# Patient Record
Sex: Male | Born: 1970 | Race: Black or African American | Hispanic: No | Marital: Married | Smoking: Never smoker
Health system: Southern US, Community
[De-identification: ages and names within clinical notes are randomized; demographics above are authoritative.]

## PROBLEM LIST (undated history)

## (undated) DIAGNOSIS — R011 Cardiac murmur, unspecified: Secondary | ICD-10-CM

## (undated) DIAGNOSIS — M1990 Unspecified osteoarthritis, unspecified site: Secondary | ICD-10-CM

## (undated) DIAGNOSIS — E079 Disorder of thyroid, unspecified: Secondary | ICD-10-CM

## (undated) DIAGNOSIS — I10 Essential (primary) hypertension: Secondary | ICD-10-CM

## (undated) DIAGNOSIS — E119 Type 2 diabetes mellitus without complications: Secondary | ICD-10-CM

## (undated) HISTORY — DX: Disorder of thyroid, unspecified: E07.9

## (undated) HISTORY — DX: Type 2 diabetes mellitus without complications: E11.9

## (undated) HISTORY — PX: CARDIAC CATHETERIZATION: SHX172

## (undated) HISTORY — PX: OTHER SURGICAL HISTORY: SHX169

---

## 2005-01-16 ENCOUNTER — Ambulatory Visit: Payer: Self-pay | Admitting: Physician Assistant

## 2008-02-24 ENCOUNTER — Ambulatory Visit: Payer: Self-pay | Admitting: Gastroenterology

## 2008-03-18 ENCOUNTER — Ambulatory Visit: Payer: Self-pay | Admitting: Gastroenterology

## 2010-10-23 ENCOUNTER — Emergency Department: Payer: Self-pay | Admitting: Emergency Medicine

## 2011-09-27 ENCOUNTER — Ambulatory Visit: Payer: Self-pay

## 2013-10-29 ENCOUNTER — Emergency Department: Payer: Self-pay | Admitting: Emergency Medicine

## 2013-10-29 LAB — TROPONIN I: Troponin-I: <0.02 ng/mL

## 2013-10-29 LAB — CBC WITH DIFFERENTIAL/PLATELET
Basophil #: 0.1 10*3/uL (ref 0.0–0.1)
Basophil %: 1.1 %
EOS PCT: 1.8 %
Eosinophil #: 0.1 10*3/uL (ref 0.0–0.7)
HCT: 43 % (ref 40.0–52.0)
HGB: 14.3 g/dL (ref 13.0–18.0)
LYMPHS ABS: 1.7 10*3/uL (ref 1.0–3.6)
Lymphocyte %: 28.9 %
MCH: 27.2 pg (ref 26.0–34.0)
MCHC: 33.1 g/dL (ref 32.0–36.0)
MCV: 82 fL (ref 80–100)
MONO ABS: 0.6 x10 3/mm (ref 0.2–1.0)
MONOS PCT: 10.1 %
NEUTROS ABS: 3.4 10*3/uL (ref 1.4–6.5)
Neutrophil %: 58.1 %
PLATELETS: 229 10*3/uL (ref 150–440)
RBC: 5.25 10*6/uL (ref 4.40–5.90)
RDW: 13.7 % (ref 11.5–14.5)
WBC: 5.8 10*3/uL (ref 3.8–10.6)

## 2013-10-29 LAB — COMPREHENSIVE METABOLIC PANEL
ALK PHOS: 79 U/L
AST: 38 U/L — AB (ref 15–37)
Albumin: 3.8 g/dL (ref 3.4–5.0)
Anion Gap: 4 — ABNORMAL LOW (ref 7–16)
BUN: 11 mg/dL (ref 7–18)
Bilirubin,Total: 0.3 mg/dL (ref 0.2–1.0)
CALCIUM: 8.4 mg/dL — AB (ref 8.5–10.1)
CREATININE: 1.17 mg/dL (ref 0.60–1.30)
Chloride: 107 mmol/L (ref 98–107)
Co2: 29 mmol/L (ref 21–32)
EGFR (Non-African Amer.): >60
Glucose: 95 mg/dL (ref 65–99)
Osmolality: 279 (ref 275–301)
Potassium: 3.7 mmol/L (ref 3.5–5.1)
SGPT (ALT): 39 U/L (ref 12–78)
SODIUM: 140 mmol/L (ref 136–145)
TOTAL PROTEIN: 7.3 g/dL (ref 6.4–8.2)

## 2013-10-29 LAB — LIPASE, BLOOD: LIPASE: 277 U/L (ref 73–393)

## 2014-08-17 ENCOUNTER — Ambulatory Visit (INDEPENDENT_AMBULATORY_CARE_PROVIDER_SITE_OTHER): Payer: 59 | Admitting: Podiatry

## 2014-08-17 ENCOUNTER — Encounter: Payer: Self-pay | Admitting: Podiatry

## 2014-08-17 ENCOUNTER — Ambulatory Visit (INDEPENDENT_AMBULATORY_CARE_PROVIDER_SITE_OTHER): Payer: 59

## 2014-08-17 VITALS — BP 140/98 | HR 75 | Resp 16 | Ht 71.0 in | Wt 255.0 lb

## 2014-08-17 DIAGNOSIS — M779 Enthesopathy, unspecified: Secondary | ICD-10-CM

## 2014-08-17 DIAGNOSIS — M25572 Pain in left ankle and joints of left foot: Secondary | ICD-10-CM

## 2014-08-17 NOTE — Progress Notes (Addendum)
He presents today with chief complaint of pain to his left foot times the past several weeks to months. He states he gets a sharp stabbing pain that radiates up his leg from his left foot.  Objective: Vital signs are stable he is alert and oriented 3. He has limited range of motion subtalar joint left. Pulses are palpable left. 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  Plan: Injected the subtalar joint today with dexamethasone and Kenalog and local anesthetic each alleviated his symptoms immediately. I will follow-up with him in 3 weeks should this continue to alleviate his symptoms we will have a pair of orthotics scanned for him otherwise if he is still in pain and MRI will be our best alternative for possible surgical consideration

## 2014-09-07 ENCOUNTER — Ambulatory Visit: Payer: 59 | Admitting: Podiatry

## 2014-12-16 ENCOUNTER — Ambulatory Visit (INDEPENDENT_AMBULATORY_CARE_PROVIDER_SITE_OTHER): Payer: 59 | Admitting: Podiatry

## 2014-12-16 DIAGNOSIS — M7752 Other enthesopathy of left foot: Secondary | ICD-10-CM | POA: Diagnosis not present

## 2014-12-16 DIAGNOSIS — M779 Enthesopathy, unspecified: Principal | ICD-10-CM

## 2014-12-16 DIAGNOSIS — M205X2 Other deformities of toe(s) (acquired), left foot: Secondary | ICD-10-CM | POA: Diagnosis not present

## 2014-12-16 DIAGNOSIS — M778 Other enthesopathies, not elsewhere classified: Secondary | ICD-10-CM

## 2014-12-16 MED ORDER — METHYLPREDNISOLONE 4 MG PO TBPK: ORAL_TABLET | ORAL | Status: DC

## 2014-12-16 NOTE — Progress Notes (Signed)
Derin presents today for follow-up of his first metatarsophalangeal joint of his left foot. He states that about a week or 2 ago his foot again swell and become painful he states that the only thing that was different is that he purchased a new pair of shoes that had soft soles. Denies any changes in his past mental history medications or allergies.  Objective: Vital signs are stable he is alert and oriented 3. Pulses are strongly palpable left foot. Pain on range of motion of the first metatarsophalangeal joint left with limited dorsiflexion and hallux valgus deformity. Reviewed all radiographs which do demonstrate hallux valgus with early osteophytic changes.  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  Plan: Discussed etiology pathology conservative versus surgical therapies. Discussed appropriate shoe gear stretching exercises ice therapy and shoe modifications. He declined an injection today. I started him on a Medrol Dosepak and will follow up with him in 4 weeks if necessary. I did suggest different shoe gear.

## 2015-01-13 ENCOUNTER — Ambulatory Visit: Payer: 59 | Admitting: Podiatry

## 2016-01-11 ENCOUNTER — Emergency Department
Admission: EM | Admit: 2016-01-11 | Discharge: 2016-01-11 | Disposition: A | Discharge status: Home / Self Care | Payer: No Typology Code available for payment source | Attending: Emergency Medicine | Admitting: Emergency Medicine

## 2016-01-11 ENCOUNTER — Encounter: Payer: Self-pay | Admitting: Emergency Medicine

## 2016-01-11 ENCOUNTER — Emergency Department: Payer: No Typology Code available for payment source

## 2016-01-11 DIAGNOSIS — I10 Essential (primary) hypertension: Secondary | ICD-10-CM | POA: Insufficient documentation

## 2016-01-11 DIAGNOSIS — Y939 Activity, unspecified: Secondary | ICD-10-CM | POA: Diagnosis not present

## 2016-01-11 DIAGNOSIS — Z79899 Other long term (current) drug therapy: Secondary | ICD-10-CM | POA: Diagnosis not present

## 2016-01-11 DIAGNOSIS — S39012A Strain of muscle, fascia and tendon of lower back, initial encounter: Secondary | ICD-10-CM | POA: Insufficient documentation

## 2016-01-11 DIAGNOSIS — Y92410 Unspecified street and highway as the place of occurrence of the external cause: Secondary | ICD-10-CM | POA: Insufficient documentation

## 2016-01-11 DIAGNOSIS — Y999 Unspecified external cause status: Secondary | ICD-10-CM | POA: Diagnosis not present

## 2016-01-11 DIAGNOSIS — S3992XA Unspecified injury of lower back, initial encounter: Secondary | ICD-10-CM | POA: Diagnosis present

## 2016-01-11 HISTORY — DX: Essential (primary) hypertension: I10

## 2016-01-11 MED ORDER — BACLOFEN 10 MG PO TABS: 10.0000 mg | ORAL_TABLET | Freq: Three times a day (TID) | ORAL | Status: DC

## 2016-01-11 MED ORDER — IBUPROFEN 800 MG PO TABS: 800.0000 mg | ORAL_TABLET | Freq: Three times a day (TID) | ORAL | Status: DC | PRN
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
## 2016-01-11 NOTE — Discharge Instructions (Signed)
Cryotherapy °Cryotherapy means treatment with cold. Ice or gel packs can be used to reduce both pain and swelling. Ice is the most helpful within the first 24 to 48 hours after an injury or flare-up from overusing a muscle or joint. Sprains, strains, spasms, burning pain, shooting pain, and aches can all be eased with ice. Ice can also be used when recovering from surgery. Ice is effective, has very few side effects, and is safe for most people to use. °PRECAUTIONS  °Ice is not a safe treatment option for people with: °· Raynaud phenomenon. This is a condition affecting small blood vessels in the extremities. Exposure to cold may cause your problems to return. °· Cold hypersensitivity. There are many forms of cold hypersensitivity, including: °· Cold urticaria. Red, itchy hives appear on the skin when the tissues begin to warm after being iced. °· Cold erythema. This is a red, itchy rash caused by exposure to cold. °· Cold hemoglobinuria. Red blood cells break down when the tissues begin to warm after being iced. The hemoglobin that carry oxygen are passed into the urine because they cannot combine with blood proteins fast enough. °· Numbness or altered sensitivity in the area being iced. °If you have any of the following conditions, do not use ice until you have discussed cryotherapy with your caregiver: °· Heart conditions, such as arrhythmia, angina, or chronic heart disease. °· High blood pressure. °· Healing wounds or open skin in the area being iced. °· Current infections. °· Rheumatoid arthritis. °· Poor circulation. °· Diabetes. °Ice slows the blood flow in the region it is applied. This is beneficial when trying to stop inflamed tissues from spreading irritating chemicals to surrounding tissues. However, if you expose your skin to cold temperatures for too long or without the proper protection, you can damage your skin or nerves. Watch for signs of skin damage due to cold. °HOME CARE INSTRUCTIONS °Follow  these tips to use ice and cold packs safely. °· Place a dry or damp towel between the ice and skin. A damp towel will cool the skin more quickly, so you may need to shorten the time that the ice is used. °· For a more rapid response, add gentle compression to the ice. °· Ice for no more than 10 to 20 minutes at a time. The bonier the area you are icing, the less time it will take to get the benefits of ice. °· Check your skin after 5 minutes to make sure there are no signs of a poor response to cold or skin damage. °· Rest 20 minutes or more between uses. °· Once your skin is numb, you can end your treatment. You can test numbness by very lightly touching your skin. The touch should be so light that you do not see the skin dimple from the pressure of your fingertip. When using ice, most people will feel these normal sensations in this order: cold, burning, aching, and numbness. °· Do not use ice on someone who cannot communicate their responses to pain, such as small children or people with dementia. °HOW TO MAKE AN ICE PACK °Ice packs are the most common way to use ice therapy. Other methods include ice massage, ice baths, and cryosprays. Muscle creams that cause a cold, tingly feeling do not offer the same benefits that ice offers and should not be used as a substitute unless recommended by your caregiver. °To make an ice pack, do one of the following: °· Place crushed ice or a   bag of frozen vegetables in a sealable plastic bag. Squeeze out the excess air. Place this bag inside another plastic bag. Slide the bag into a pillowcase or place a damp towel between your skin and the bag.  Mix 3 parts water with 1 part rubbing alcohol. Freeze the mixture in a sealable plastic bag. When you remove the mixture from the freezer, it will be slushy. Squeeze out the excess air. Place this bag inside another plastic bag. Slide the bag into a pillowcase or place a damp towel between your skin and the bag. SEEK MEDICAL CARE  IF:  You develop white spots on your skin. This may give the skin a blotchy (mottled) appearance.  Your skin turns blue or pale.  Your skin becomes waxy or hard.  Your swelling gets worse. MAKE SURE YOU:   Understand these instructions.  Will watch your condition.  Will get help right away if you are not doing well or get worse.   This information is not intended to replace advice given to you by your health care provider. Make sure you discuss any questions you have with your health care provider.   Document Released: 03/06/2011 Document Revised: 07/31/2014 Document Reviewed: 03/06/2011 Elsevier Interactive Patient Education 2016 [employer].  Technical brewer It is common to have multiple bruises and sore muscles after a motor vehicle collision (MVC). These tend to feel worse for the first 24 hours. You may have the most stiffness and soreness over the first several hours. You may also feel worse when you wake up the first morning after your collision. After this point, you will usually begin to improve with each day. The speed of improvement often depends on the severity of the collision, the number of injuries, and the location and nature of these injuries. HOME CARE INSTRUCTIONS  Put ice on the injured area.  Put ice in a plastic bag.  Place a towel between your skin and the bag.  Leave the ice on for 15-20 minutes, 3-4 times a day, or as directed by your health care provider.  Drink enough fluids to keep your urine clear or pale yellow. Do not drink alcohol.  Take a warm shower or bath once or twice a day. This will increase blood flow to sore muscles.  You may return to activities as directed by your caregiver. Be careful when lifting, as this may aggravate neck or back pain.  Only take over-the-counter or prescription medicines for pain, discomfort, or fever as directed by your caregiver. Do not use aspirin. This may increase bruising and bleeding. SEEK  IMMEDIATE MEDICAL CARE IF:  You have numbness, tingling, or weakness in the arms or legs.  You develop severe headaches not relieved with medicine.  You have severe neck pain, especially tenderness in the middle of the back of your neck.  You have changes in bowel or bladder control.  There is increasing pain in any area of the body.  You have shortness of breath, light-headedness, dizziness, or fainting.  You have chest pain.  You feel sick to your stomach (nauseous), throw up (vomit), or sweat.  You have increasing abdominal discomfort.  There is blood in your urine, stool, or vomit.  You have pain in your shoulder (shoulder strap areas).  You feel your symptoms are getting worse. MAKE SURE YOU:  Understand these instructions.  Will watch your condition.  Will get help right away if you are not doing well or get worse.   This information is  not intended to replace advice given to you by your health care provider. Make sure you discuss any questions you have with your health care provider.   Document Released: 07/10/2005 Document Revised: 07/31/2014 Document Reviewed: 12/07/2010 Elsevier Interactive Patient Education 2016 Elsevier Inc[employer]  Lumbosacral Strain Lumbosacral strain is a strain of any of the parts that make up your lumbosacral vertebrae. Your lumbosacral vertebrae are the bones that make up the lower third of your backbone. Your lumbosacral vertebrae are held together by muscles and tough, fibrous tissue (ligaments).  CAUSES  A sudden blow to your back can cause lumbosacral strain. Also, anything that causes an excessive stretch of the muscles in the low back can cause this strain. This is typically seen when people exert themselves strenuously, fall, lift heavy objects, bend, or crouch repeatedly. RISK FACTORS  Physically demanding work.  Participation in pushing or pulling sports or sports that require a sudden twist of the back (tennis, golf,  baseball).  Weight lifting.  Excessive lower back curvature.  Forward-tilted pelvis.  Weak back or abdominal muscles or both.  Tight hamstrings. SIGNS AND SYMPTOMS  Lumbosacral strain may cause pain in the area of your injury or pain that moves (radiates) down your leg.  DIAGNOSIS Your health care provider can often diagnose lumbosacral strain through a physical exam. In some cases, you may need tests such as X-ray exams.  TREATMENT  Treatment for your lower back injury depends on many factors that your clinician will have to evaluate. However, most treatment will include the use of anti-inflammatory medicines. HOME CARE INSTRUCTIONS   Avoid hard physical activities (tennis, racquetball, waterskiing) if you are not in proper physical condition for it. This may aggravate or create problems.  If you have a back problem, avoid sports requiring sudden body movements. Swimming and walking are generally safer activities.  Maintain good posture.  Maintain a healthy weight.  For acute conditions, you may put ice on the injured area.  Put ice in a plastic bag.  Place a towel between your skin and the bag.  Leave the ice on for 20 minutes, 2-3 times a day.  When the low back starts healing, stretching and strengthening exercises may be recommended. SEEK MEDICAL CARE IF:  Your back pain is getting worse.  You experience severe back pain not relieved with medicines. SEEK IMMEDIATE MEDICAL CARE IF:   You have numbness, tingling, weakness, or problems with the use of your arms or legs.  There is a change in bowel or bladder control.  You have increasing pain in any area of the body, including your belly (abdomen).  You notice shortness of breath, dizziness, or feel faint.  You feel sick to your stomach (nauseous), are throwing up (vomiting), or become sweaty.  You notice discoloration of your toes or legs, or your feet get very cold. MAKE SURE YOU:   Understand these  instructions.  Will watch your condition.  Will get help right away if you are not doing well or get worse.   This information is not intended to replace advice given to you by your health care provider. Make sure you discuss any questions you have with your health care provider.   Document Released: 04/19/2005 Document Revised: 07/31/2014 Document Reviewed: 02/26/2013 Elsevier Interactive Patient Education [employer].
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
## 2016-11-06 ENCOUNTER — Emergency Department
Admission: EM | Admit: 2016-11-06 | Discharge: 2016-11-06 | Disposition: A | Discharge status: Home / Self Care | Payer: No Typology Code available for payment source | Attending: Emergency Medicine | Admitting: Emergency Medicine

## 2016-11-06 ENCOUNTER — Encounter: Payer: Self-pay | Admitting: *Deleted

## 2016-11-06 DIAGNOSIS — M545 Low back pain: Secondary | ICD-10-CM | POA: Insufficient documentation

## 2016-11-06 DIAGNOSIS — Y92410 Unspecified street and highway as the place of occurrence of the external cause: Secondary | ICD-10-CM | POA: Insufficient documentation

## 2016-11-06 DIAGNOSIS — M7918 Myalgia, other site: Secondary | ICD-10-CM

## 2016-11-06 DIAGNOSIS — I10 Essential (primary) hypertension: Secondary | ICD-10-CM | POA: Diagnosis not present

## 2016-11-06 DIAGNOSIS — S3992XA Unspecified injury of lower back, initial encounter: Secondary | ICD-10-CM | POA: Diagnosis present

## 2016-11-06 DIAGNOSIS — Y9389 Activity, other specified: Secondary | ICD-10-CM | POA: Insufficient documentation

## 2016-11-06 DIAGNOSIS — M791 Myalgia: Secondary | ICD-10-CM | POA: Insufficient documentation

## 2016-11-06 DIAGNOSIS — Z79899 Other long term (current) drug therapy: Secondary | ICD-10-CM | POA: Insufficient documentation

## 2016-11-06 DIAGNOSIS — Y999 Unspecified external cause status: Secondary | ICD-10-CM | POA: Insufficient documentation

## 2016-11-06 MED ORDER — NAPROXEN 500 MG PO TABS: 500.0000 mg | ORAL_TABLET | Freq: Two times a day (BID) | ORAL | 0 refills | Status: DC

## 2016-11-06 MED ORDER — CYCLOBENZAPRINE HCL 10 MG PO TABS: 10.0000 mg | ORAL_TABLET | Freq: Three times a day (TID) | ORAL | 0 refills | Status: DC | PRN
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
## 2016-11-06 NOTE — Discharge Instructions (Signed)
Please follow up with your primary care provider for symptoms that are not improving over the week.  Return to the ER for symptoms that change or worsen if unable to schedule an appointment. 
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
## 2016-12-25 ENCOUNTER — Ambulatory Visit: Payer: 59 | Admitting: Podiatry

## 2017-08-16 ENCOUNTER — Encounter: Payer: Self-pay | Admitting: Nurse Practitioner

## 2017-08-16 ENCOUNTER — Ambulatory Visit: Payer: BLUE CROSS/BLUE SHIELD | Admitting: Nurse Practitioner

## 2017-08-16 VITALS — BP 138/90 | HR 67 | Resp 16 | Ht 71.0 in | Wt 255.8 lb

## 2017-08-16 DIAGNOSIS — Z6835 Body mass index (BMI) 35.0-35.9, adult: Secondary | ICD-10-CM | POA: Diagnosis not present

## 2017-08-16 DIAGNOSIS — I10 Essential (primary) hypertension: Secondary | ICD-10-CM

## 2017-08-16 DIAGNOSIS — Z125 Encounter for screening for malignant neoplasm of prostate: Secondary | ICD-10-CM

## 2017-08-16 DIAGNOSIS — H6063 Unspecified chronic otitis externa, bilateral: Secondary | ICD-10-CM

## 2017-08-16 DIAGNOSIS — E669 Obesity, unspecified: Secondary | ICD-10-CM

## 2017-08-16 DIAGNOSIS — E039 Hypothyroidism, unspecified: Secondary | ICD-10-CM | POA: Diagnosis not present

## 2017-08-16 DIAGNOSIS — J309 Allergic rhinitis, unspecified: Secondary | ICD-10-CM

## 2017-08-16 MED ORDER — NEOMYCIN-COLIST-HC-THONZONIUM 3.3-3-10-0.5 MG/ML OT SUSP: 4.0000 [drp] | Freq: Three times a day (TID) | OTIC | 3 refills | Status: DC

## 2017-08-16 MED ORDER — LOSARTAN POTASSIUM 50 MG PO TABS: 50.0000 mg | ORAL_TABLET | Freq: Every day | ORAL | 3 refills | Status: DC

## 2017-08-16 MED ORDER — CETIRIZINE HCL 10 MG PO TABS: 10.0000 mg | ORAL_TABLET | Freq: Every day | ORAL | 4 refills | Status: DC

## 2017-08-16 MED ORDER — HYDROCHLOROTHIAZIDE 12.5 MG PO TABS: 12.5000 mg | ORAL_TABLET | Freq: Every day | ORAL | 3 refills | Status: DC

## 2017-08-16 MED ORDER — PHENDIMETRAZINE TARTRATE ER 105 MG PO CP24: 1.0000 | ORAL_CAPSULE | Freq: Every day | ORAL | 1 refills | Status: DC

## 2017-08-16 MED ORDER — LEVOTHYROXINE SODIUM 50 MCG PO TABS: 50.0000 ug | ORAL_TABLET | Freq: Every day | ORAL | 4 refills | Status: DC

## 2017-08-22 DIAGNOSIS — E66812 Obesity, class 2: Secondary | ICD-10-CM | POA: Insufficient documentation

## 2017-08-22 DIAGNOSIS — E669 Obesity, unspecified: Secondary | ICD-10-CM | POA: Insufficient documentation

## 2017-08-22 DIAGNOSIS — E039 Hypothyroidism, unspecified: Secondary | ICD-10-CM | POA: Insufficient documentation

## 2017-08-22 DIAGNOSIS — H6063 Unspecified chronic otitis externa, bilateral: Secondary | ICD-10-CM | POA: Insufficient documentation

## 2017-08-22 DIAGNOSIS — J309 Allergic rhinitis, unspecified: Secondary | ICD-10-CM | POA: Insufficient documentation

## 2017-08-22 DIAGNOSIS — Z6835 Body mass index (BMI) 35.0-35.9, adult: Secondary | ICD-10-CM

## 2017-08-22 NOTE — Progress Notes (Addendum)
[facility] [place], [place] [address]  Internal MEDICINE  Office Visit Note  Patient Name: Ronald Mata  [id number]  [id number]  Date of Service: 08/22/2017  Chief Complaint  Patient presents with  . Otitis Media    recurrent. worse when flying.     Other  This is a recurrent problem. The current episode started more than 1 month ago. The problem occurs intermittently. The problem has been waxing and waning. Associated symptoms include congestion, headaches and vertigo. Pertinent negatives include no abdominal pain, arthralgias, chest pain, chills, coughing, fatigue, fever, joint swelling, nausea, neck pain, numbness, rash, sore throat or vomiting. Exacerbated by: flying. Treatments tried: oral and otic antibiotics  The treatment provided mild relief.    Pt is here for routine follow up.    Current Medication: Outpatient Encounter Medications as of 08/16/2017  Medication Sig Note  . baclofen (LIORESAL) 10 MG tablet Take 1 tablet (10 mg total) by mouth 3 (three) times daily. (Patient not taking: Reported on 08/16/2017)   . cetirizine (ZYRTEC) 10 MG tablet Take 1 tablet (10 mg total) by mouth daily.   . cyclobenzaprine (FLEXERIL) 10 MG tablet Take 1 tablet (10 mg total) by mouth 3 (three) times daily as needed for muscle spasms. (Patient not taking: Reported on 08/16/2017)   . hydrochlorothiazide (HYDRODIURIL) 12.5 MG tablet Take 1 tablet (12.5 mg total) by mouth daily.   Marland Kitchen ibuprofen (ADVIL,MOTRIN) 800 MG tablet Take 1 tablet (800 mg total) by mouth every 8 (eight) hours as needed. (Patient not taking: Reported on 08/16/2017)   . levothyroxine (SYNTHROID, LEVOTHROID) 50 MCG tablet Take 1 tablet (50 mcg total) by mouth daily before breakfast.   . losartan (COZAAR) 50 MG tablet Take 1 tablet (50 mg total) by mouth daily.   . naproxen (NAPROSYN) 500 MG tablet Take 1 tablet (500 mg total) by mouth 2 (two) times daily with a meal. (Patient not taking: Reported on  08/16/2017)   . neomycin-colistin-hydrocortisone-thonzonium (COLY-MYCIN S) 3.09-23-08-0.5 MG/ML OTIC suspension Place 4 drops into both ears 3 (three) times daily.   . Phendimetrazine Tartrate 105 MG CP24 Take 1 capsule (105 mg total) by mouth daily.   . [DISCONTINUED] amLODipine (NORVASC) 5 MG tablet  08/17/2014: Received from: External Pharmacy  . [DISCONTINUED] levothyroxine (SYNTHROID, LEVOTHROID) 50 MCG tablet  08/17/2014: Received from: External Pharmacy  . [DISCONTINUED] lisinopril (PRINIVIL,ZESTRIL) 10 MG tablet  08/17/2014: Received from: External Pharmacy   No facility-administered encounter medications on file as of 08/16/2017.     Surgical History: Past Surgical History:  Procedure Laterality Date  . stomach wrap and achalasia      Medical History: Past Medical History:  Diagnosis Date  . Hypertension     Family History: Family History  Problem Relation Age of Onset  . Diabetes Mother   . Hypertension Mother   . Hyperlipidemia Mother   . Stroke Mother   . Osteoarthritis Mother   . Diabetes Father   . Osteoarthritis Father   . Glaucoma Father     Social History   Socioeconomic History  . Marital status: Married    Spouse name: Not on file  . Number of children: Not on file  . Years of education: Not on file  . Highest education level: Not on file  Social Needs  . Financial resource strain: Not on file  . Food insecurity - worry: Not on file  . Food insecurity - inability: Not on file  . Transportation needs - medical: Not  on file  . Transportation needs - non-medical: Not on file  Occupational History  . Not on file  Tobacco Use  . Smoking status: Never Smoker  . Smokeless tobacco: Never Used  Substance and Sexual Activity  . Alcohol use: No    Alcohol/week: 0.0 oz  . Drug use: Not on file  . Sexual activity: Not on file  Other Topics Concern  . Not on file  Social History Narrative  . Not on file      Review of Systems  Constitutional: Negative  for activity change, chills, fatigue, fever and unexpected weight change.  HENT: Positive for congestion, postnasal drip and rhinorrhea. Negative for sneezing and sore throat.   Eyes: Negative.  Negative for redness.  Respiratory: Negative for cough, chest tightness and shortness of breath.   Cardiovascular: Negative for chest pain and palpitations.       Sometimes has elevated blood pressure  Gastrointestinal: Negative for abdominal pain, constipation, diarrhea, nausea and vomiting.  Endocrine:       Well controlled thyroid disease.  Genitourinary: Negative.  Negative for dysuria and frequency.  Musculoskeletal: Negative for arthralgias, back pain, joint swelling and neck pain.  Skin: Negative for rash.  Neurological: Positive for dizziness, vertigo and headaches. Negative for tremors and numbness.  Hematological: Negative for adenopathy. Does not bruise/bleed easily.  Psychiatric/Behavioral: Negative for behavioral problems (Depression), sleep disturbance and suicidal ideas. The patient is not nervous/anxious.     Vital Signs: BP 138/90   Pulse 67   Resp 16   Ht 5\' 11"  (1.803 m)   Wt 255 lb 12.8 oz (116 kg)   SpO2 98%   BMI 35.68 kg/m    Physical Exam  Constitutional: He is oriented to person, place, and time. He appears well-developed and well-nourished. No distress.  HENT:  Head: Normocephalic and atraumatic.  Right Ear: There is tenderness. Tympanic membrane is erythematous and bulging.  Left Ear: There is tenderness. Tympanic membrane is erythematous and bulging.  Nose: Nose normal. Right sinus exhibits no maxillary sinus tenderness and no frontal sinus tenderness. Left sinus exhibits no maxillary sinus tenderness and no frontal sinus tenderness.  Mouth/Throat: Oropharynx is clear and moist. No oropharyngeal exudate.  Eyes: EOM are normal. Pupils are equal, round, and reactive to light.  Neck: Normal range of motion. Neck supple. No JVD present. No tracheal deviation  present. No thyromegaly present.  Cardiovascular: Normal rate, regular rhythm and normal heart sounds. Exam reveals no gallop and no friction rub.  No murmur heard. Pulmonary/Chest: Effort normal and breath sounds normal. No respiratory distress. He has no wheezes. He has no rales. He exhibits no tenderness.  Abdominal: Soft. Bowel sounds are normal. There is no tenderness.  Musculoskeletal: Normal range of motion.  Lymphadenopathy:    He has no cervical adenopathy.  Neurological: He is alert and oriented to person, place, and time. No cranial nerve deficit.  Skin: Skin is warm and dry. He is not diaphoretic.  Psychiatric: He has a normal mood and affect. His behavior is normal. Judgment and thought content normal.  Nursing note and vitals reviewed.      Assessment/Plan: 1. Chronic otitis externa of both ears, unspecified type- neomycin-colistin-hydrocortisone-thonzonium (COLY-MYCIN S) 3.09-23-08-0.5 MG/ML OTIC suspension; Place 4 drops into both ears 3 (three) times daily.  Dispense: 10 mL; Refill: 3  2. Allergic rhinitis, unspecified seasonality, unspecified trigger - cetirizine (ZYRTEC) 10 MG tablet; Take 1 tablet (10 mg total) by mouth daily.  Dispense: 90 tablet; Refill: 4  3. Essential hypertension - losartan (COZAAR) 50 MG tablet; Take 1 tablet (50 mg total) by mouth daily.  Dispense: 90 tablet; Refill: 3 - hydrochlorothiazide (HYDRODIURIL) 12.5 MG tablet; Take 1 tablet (12.5 mg total) by mouth daily.  Dispense: 90 tablet; Refill: 3 - CBC with Differential/Platelet - Comprehensive metabolic panel - Lipid panel  4. Class 2 obesity with body mass index (BMI) of 35.0 to 35.9 in adult, unspecified obesity type, unspecified whether serious comorbidity present - Phendimetrazine Tartrate 105 MG CP24; Take 1 capsule (105 mg total) by mouth daily.  Dispense: 30 each; Refill: 1  5. Acquired hypothyroidism - levothyroxine (SYNTHROID, LEVOTHROID) 50 MCG tablet; Take 1 tablet (50 mcg total)  by mouth daily before breakfast.  Dispense: 90 tablet; Refill: 4 - TSH  6. Screening for prostate cancer - PSA  General Counseling: Ronald Mata verbalizes understanding of the findings of todays visit and agrees with plan of treatment. I have discussed any further diagnostic evaluation that may be needed or ordered today. We also reviewed his medications today. he has been encouraged to call the office with any questions or concerns that should arise related to todays visit.   Hypertension Counseling:   The following hypertensive lifestyle modification were recommended and discussed:  1. Limiting alcohol intake to less than 1 oz/day of ethanol:(24 oz of beer or 8 oz of wine or 2 oz of 100-proof whiskey). 2. Take baby ASA 81 mg daily. 3. Importance of regular aerobic exercise and losing weight. 4. Reduce dietary saturated fat and cholesterol intake for overall cardiovascular health. 5. Maintaining adequate dietary potassium, calcium, and magnesium intake. 6. Regular monitoring of the blood pressure. 7. Reduce sodium intake to less than 100 mmol/day (less than 2.3 gm of sodium or less than 6 gm of sodium choride)   This patient was seen by Leretha Pol, FNP- C in Collaboration with Dr Lavera Guise as a part of collaborative care agreement  Orders Placed This Encounter  Procedures  . PSA  . CBC with Differential/Platelet  . Comprehensive metabolic panel  . Lipid panel  . TSH    Meds ordered this encounter  Medications  . levothyroxine (SYNTHROID, LEVOTHROID) 50 MCG tablet    Sig: Take 1 tablet (50 mcg total) by mouth daily before breakfast.    Dispense:  90 tablet    Refill:  4    Order Specific Question:   Supervising Provider    Answer:   Lavera Guise [place]  . losartan (COZAAR) 50 MG tablet    Sig: Take 1 tablet (50 mg total) by mouth daily.    Dispense:  90 tablet    Refill:  3    Order Specific Question:   Supervising Provider    Answer:   Lavera Guise [[id number]]  .  hydrochlorothiazide (HYDRODIURIL) 12.5 MG tablet    Sig: Take 1 tablet (12.5 mg total) by mouth daily.    Dispense:  90 tablet    Refill:  3    Order Specific Question:   Supervising Provider    Answer:   Lavera Guise [[id number]]  . Phendimetrazine Tartrate 105 MG CP24    Sig: Take 1 capsule (105 mg total) by mouth daily.    Dispense:  30 each    Refill:  1    Order Specific Question:   Supervising Provider    Answer:   Lavera Guise [[id number]]  . cetirizine (ZYRTEC) 10 MG tablet    Sig: Take 1 tablet (10 mg total)  by mouth daily.    Dispense:  90 tablet    Refill:  4    Order Specific Question:   Supervising Provider    Answer:   Lavera Guise [[id number]]  . neomycin-colistin-hydrocortisone-thonzonium (COLY-MYCIN S) 3.09-23-08-0.5 MG/ML OTIC suspension    Sig: Place 4 drops into both ears 3 (three) times daily.    Dispense:  10 mL    Refill:  3    Order Specific Question:   Supervising Provider    Answer:   Lavera Guise [[id number]]    Time spent: 10 Minutes     Dr Lavera Guise Internal medicine

## 2017-09-10 DIAGNOSIS — S56912D Strain of unspecified muscles, fascia and tendons at forearm level, left arm, subsequent encounter: Secondary | ICD-10-CM | POA: Diagnosis not present

## 2017-09-10 DIAGNOSIS — M109 Gout, unspecified: Secondary | ICD-10-CM | POA: Diagnosis not present

## 2017-09-11 LAB — CBC WITH DIFFERENTIAL/PLATELET
BASOS ABS: 0 10*3/uL (ref 0.0–0.2)
Basos: 0 %
EOS (ABSOLUTE): 0 10*3/uL (ref 0.0–0.4)
Eos: 1 %
Hematocrit: 40.8 % (ref 37.5–51.0)
Hemoglobin: 14.2 g/dL (ref 13.0–17.7)
IMMATURE GRANS (ABS): 0 10*3/uL (ref 0.0–0.1)
IMMATURE GRANULOCYTES: 0 %
LYMPHS: 26 %
Lymphocytes Absolute: 1.7 10*3/uL (ref 0.7–3.1)
MCH: 27.7 pg (ref 26.6–33.0)
MCHC: 34.8 g/dL (ref 31.5–35.7)
MCV: 80 fL (ref 79–97)
MONOCYTES: 10 %
Monocytes Absolute: 0.6 10*3/uL (ref 0.1–0.9)
NEUTROS PCT: 63 %
Neutrophils Absolute: 4.1 10*3/uL (ref 1.4–7.0)
PLATELETS: 224 10*3/uL (ref 150–379)
RBC: 5.12 x10E6/uL (ref 4.14–5.80)
RDW: 14.2 % (ref 12.3–15.4)
WBC: 6.4 10*3/uL (ref 3.4–10.8)

## 2017-09-11 LAB — LIPID PANEL
Chol/HDL Ratio: 4.8 ratio (ref 0.0–5.0)
Cholesterol, Total: 209 mg/dL — ABNORMAL HIGH (ref 100–199)
HDL: 44 mg/dL (ref >39)
LDL CALC: 149 mg/dL — AB (ref 0–99)
Triglycerides: 78 mg/dL (ref 0–149)
VLDL CHOLESTEROL CAL: 16 mg/dL (ref 5–40)

## 2017-09-11 LAB — COMPREHENSIVE METABOLIC PANEL
ALBUMIN: 4.7 g/dL (ref 3.5–5.5)
ALK PHOS: 62 IU/L (ref 39–117)
ALT: 26 IU/L (ref 0–44)
AST: 25 IU/L (ref 0–40)
Albumin/Globulin Ratio: 1.8 (ref 1.2–2.2)
BUN / CREAT RATIO: 7 — AB (ref 9–20)
BUN: 8 mg/dL (ref 6–24)
Bilirubin Total: 0.6 mg/dL (ref 0.0–1.2)
CHLORIDE: 101 mmol/L (ref 96–106)
CO2: 25 mmol/L (ref 20–29)
CREATININE: 1.15 mg/dL (ref 0.76–1.27)
Calcium: 9.5 mg/dL (ref 8.7–10.2)
GFR calc Af Amer: 88 mL/min/{1.73_m2} (ref >59)
GFR calc non Af Amer: 76 mL/min/{1.73_m2} (ref >59)
GLUCOSE: 115 mg/dL — AB (ref 65–99)
Globulin, Total: 2.6 g/dL (ref 1.5–4.5)
Potassium: 4.1 mmol/L (ref 3.5–5.2)
Sodium: 143 mmol/L (ref 134–144)
Total Protein: 7.3 g/dL (ref 6.0–8.5)

## 2017-09-11 LAB — TSH: TSH: 2.62 u[IU]/mL (ref 0.450–4.500)

## 2017-09-11 LAB — PSA: PROSTATE SPECIFIC AG, SERUM: 0.4 ng/mL (ref 0.0–4.0)

## 2017-09-14 ENCOUNTER — Encounter: Payer: Self-pay | Admitting: Nurse Practitioner

## 2017-09-14 ENCOUNTER — Ambulatory Visit (INDEPENDENT_AMBULATORY_CARE_PROVIDER_SITE_OTHER): Payer: BLUE CROSS/BLUE SHIELD | Admitting: Nurse Practitioner

## 2017-09-14 VITALS — BP 155/98 | HR 96 | Resp 16 | Ht 71.0 in | Wt 261.0 lb

## 2017-09-14 DIAGNOSIS — H6063 Unspecified chronic otitis externa, bilateral: Secondary | ICD-10-CM | POA: Diagnosis not present

## 2017-09-14 DIAGNOSIS — R300 Dysuria: Secondary | ICD-10-CM

## 2017-09-14 DIAGNOSIS — Z0001 Encounter for general adult medical examination with abnormal findings: Secondary | ICD-10-CM

## 2017-09-14 DIAGNOSIS — M19022 Primary osteoarthritis, left elbow: Secondary | ICD-10-CM | POA: Diagnosis not present

## 2017-09-14 DIAGNOSIS — I10 Essential (primary) hypertension: Secondary | ICD-10-CM

## 2017-09-14 MED ORDER — NEOMYCIN-COLIST-HC-THONZONIUM 3.3-3-10-0.5 MG/ML OT SUSP: 4.0000 [drp] | Freq: Three times a day (TID) | OTIC | 3 refills | Status: DC

## 2017-09-14 MED ORDER — HYDROCHLOROTHIAZIDE 25 MG PO TABS: 25.0000 mg | ORAL_TABLET | Freq: Every day | ORAL | 2 refills | Status: DC

## 2017-09-14 MED ORDER — IBUPROFEN 800 MG PO TABS: 800.0000 mg | ORAL_TABLET | Freq: Three times a day (TID) | ORAL | 1 refills | Status: DC | PRN

## 2017-09-14 NOTE — Progress Notes (Signed)
[facility] [place], [place] [address]  Internal MEDICINE  Office Visit Note  Patient Name: Ronald Mata  [id number]  [id number]  Date of Service: 09/26/2017  Chief Complaint  Patient presents with  . Arm Pain    left  . Hypertension  . Otitis Media    chronic/frequent ear infection.    The patient is here for routine helath maintenance exam. Blood pressure is elevated. Is currently taking losartan 50mg  and HCTZ 12.5mg  daily. Denies chest pain, pressure, or shortness of breath. Having trouble with pain and inflammation of left elbow. Entire arm was swollen at one point. Had very limited ROM due to pain. He did see orthopedics. Question of gout vs. Synovitis. He was treated with cochecine and medrol dose pack. He has 2 days left with this. Uric acid level was normal. Gradually, pain and swelling is improving.  He has also had issue with chronic ear infection and swelling. Has been on antibiotics multiple times over past few months. He is traveling a lot with is job, mostly via air. Every trip seems to wrosen his symptoms. In addition to pain in the ear, he gets congested and has intermittent episodes of vertigo. We had discussed referral to ENT at his last visit, if symptoms were persistent.     Pt is here for health maintenance exam   Current Medication: Outpatient Encounter Medications as of 09/14/2017  Medication Sig Note  . baclofen (LIORESAL) 10 MG tablet Take 1 tablet (10 mg total) by mouth 3 (three) times daily. (Patient not taking: Reported on 08/16/2017)   . cetirizine (ZYRTEC) 10 MG tablet Take 1 tablet (10 mg total) by mouth daily.   . cyclobenzaprine (FLEXERIL) 10 MG tablet Take 1 tablet (10 mg total) by mouth 3 (three) times daily as needed for muscle spasms. (Patient not taking: Reported on 08/16/2017)   . hydrochlorothiazide (HYDRODIURIL) 25 MG tablet Take 1 tablet (25 mg total) by mouth daily.   Marland Kitchen ibuprofen (ADVIL,MOTRIN) 800 MG tablet Take 1  tablet (800 mg total) by mouth every 8 (eight) hours as needed for moderate pain.   Marland Kitchen levothyroxine (SYNTHROID, LEVOTHROID) 50 MCG tablet Take 1 tablet (50 mcg total) by mouth daily before breakfast.   . losartan (COZAAR) 50 MG tablet Take 1 tablet (50 mg total) by mouth daily.   . naproxen (NAPROSYN) 500 MG tablet Take 1 tablet (500 mg total) by mouth 2 (two) times daily with a meal. (Patient not taking: Reported on 08/16/2017)   . neomycin-colistin-hydrocortisone-thonzonium (COLY-MYCIN S) 3.09-23-08-0.5 MG/ML OTIC suspension Place 4 drops into both ears 3 (three) times daily.   . Phendimetrazine Tartrate 105 MG CP24 Take 1 capsule (105 mg total) by mouth daily.   . [DISCONTINUED] hydrochlorothiazide (HYDRODIURIL) 12.5 MG tablet Take 1 tablet (12.5 mg total) by mouth daily.   . [DISCONTINUED] ibuprofen (ADVIL,MOTRIN) 800 MG tablet Take 1 tablet (800 mg total) by mouth every 8 (eight) hours as needed. (Patient not taking: Reported on 08/16/2017)   . [DISCONTINUED] neomycin-colistin-hydrocortisone-thonzonium (COLY-MYCIN S) 3.09-23-08-0.5 MG/ML OTIC suspension Place 4 drops into both ears 3 (three) times daily.   . [DISCONTINUED] phentermine (ADIPEX-P) 37.5 MG tablet  08/17/2014: Received from: External Pharmacy   No facility-administered encounter medications on file as of 09/14/2017.     Surgical History: Past Surgical History:  Procedure Laterality Date  . stomach wrap and achalasia      Medical History: Past Medical History:  Diagnosis Date  . Hypertension     Family  History: Family History  Problem Relation Age of Onset  . Diabetes Mother   . Hypertension Mother   . Hyperlipidemia Mother   . Stroke Mother   . Osteoarthritis Mother   . Diabetes Father   . Osteoarthritis Father   . Glaucoma Father     Social History   Socioeconomic History  . Marital status: Married    Spouse name: Not on file  . Number of children: Not on file  . Years of education: Not on file  . Highest  education level: Not on file  Social Needs  . Financial resource strain: Not on file  . Food insecurity - worry: Not on file  . Food insecurity - inability: Not on file  . Transportation needs - medical: Not on file  . Transportation needs - non-medical: Not on file  Occupational History  . Not on file  Tobacco Use  . Smoking status: Never Smoker  . Smokeless tobacco: Never Used  Substance and Sexual Activity  . Alcohol use: No    Alcohol/week: 0.0 oz  . Drug use: No  . Sexual activity: Not on file  Other Topics Concern  . Not on file  Social History Narrative  . Not on file      Review of Systems  Constitutional: Negative for activity change, chills, fatigue and unexpected weight change.  HENT: Positive for ear discharge, ear pain, sinus pressure and tinnitus. Negative for congestion, postnasal drip, rhinorrhea, sneezing and sore throat.   Eyes: Negative.  Negative for redness.  Respiratory: Negative for cough, chest tightness and shortness of breath.   Cardiovascular: Negative for chest pain and palpitations.       Elevated bp today  Gastrointestinal: Negative for abdominal pain, constipation, diarrhea, nausea and vomiting.  Endocrine:       Stable hypothyroid  Genitourinary: Negative for dysuria and frequency.  Musculoskeletal: Positive for arthralgias. Negative for back pain, joint swelling and neck pain.       Left elbow  Skin: Negative for rash.  Allergic/Immunologic: Positive for environmental allergies.  Neurological: Positive for dizziness. Negative for tremors and numbness.  Hematological: Negative for adenopathy. Does not bruise/bleed easily.  Psychiatric/Behavioral: Negative for behavioral problems (Depression), sleep disturbance and suicidal ideas. The patient is not nervous/anxious.     Today's Vitals   09/14/17 1505  BP: (!) 155/98  Pulse: 96  Resp: 16  SpO2: 99%  Weight: 261 lb (118.4 kg)  Height: 5\' 11"  (1.803 m)    Physical Exam   Constitutional: He is oriented to person, place, and time. He appears well-developed and well-nourished. No distress.  HENT:  Head: Normocephalic and atraumatic.  Right Ear: Tympanic membrane is bulging.  Left Ear: Tympanic membrane is bulging.  Mouth/Throat: Oropharynx is clear and moist. No oropharyngeal exudate.  Eyes: EOM are normal. Pupils are equal, round, and reactive to light.  Neck: Normal range of motion. Neck supple. No JVD present. Carotid bruit is not present. No tracheal deviation present. No thyromegaly present.  Cardiovascular: Normal rate, regular rhythm, normal heart sounds and intact distal pulses. Exam reveals no gallop and no friction rub.  No murmur heard. Pulmonary/Chest: Effort normal and breath sounds normal. No respiratory distress. He has no wheezes. He has no rales. He exhibits no tenderness.  Abdominal: Soft. Bowel sounds are normal. There is no tenderness.  Musculoskeletal:  Pain in left elbow along medial and lateral aspect. Pain is more severe with flexion and extension of the left arm. Mild swelling and warmth is  present. ROM and strength is intact.   Lymphadenopathy:    He has no cervical adenopathy.  Neurological: He is alert and oriented to person, place, and time. No cranial nerve deficit.  Skin: Skin is warm and dry. He is not diaphoretic.  Psychiatric: He has a normal mood and affect. His behavior is normal. Judgment and thought content normal.  Nursing note and vitals reviewed.   Assessment/Plan: 1. Encounter for general adult medical examination with abnormal findings Annual health maintenance exam today.  2. Essential hypertension Increased HCTZ to 25mg  daily. Continue losartan 50mg  daily.  - hydrochlorothiazide (HYDRODIURIL) 25 MG tablet; Take 1 tablet (25 mg total) by mouth daily.  Dispense: 30 tablet; Refill: 2  3. Chronic otitis externa of both ears, unspecified type Cortisporin ear drops - insert 4 drops into both ears twice daily for  next 7 days.  Refer to ENT - Ambulatory referral to ENT - neomycin-colistin-hydrocortisone-thonzonium (COLY-MYCIN S) 3.09-23-08-0.5 MG/ML OTIC suspension; Place 4 drops into both ears 3 (three) times daily.  Dispense: 10 mL; Refill: 3  4. Primary osteoarthritis of left elbow - ibuprofen (ADVIL,MOTRIN) 800 MG tablet; Take 1 tablet (800 mg total) by mouth every 8 (eight) hours as needed for moderate pain.  Dispense: 90 tablet; Refill: 1  5. Dysuria - Urinalysis, Routine w reflex microscopic  General Counseling: Kaysen verbalizes understanding of the findings of todays visit and agrees with plan of treatment. I have discussed any further diagnostic evaluation that may be needed or ordered today. We also reviewed his medications today. he has been encouraged to call the office with any questions or concerns that should arise related to todays visit.  Hypertension Counseling:   The following hypertensive lifestyle modification were recommended and discussed:  1. Limiting alcohol intake to less than 1 oz/day of ethanol:(24 oz of beer or 8 oz of wine or 2 oz of 100-proof whiskey). 2. Take baby ASA 81 mg daily. 3. Importance of regular aerobic exercise and losing weight. 4. Reduce dietary saturated fat and cholesterol intake for overall cardiovascular health. 5. Maintaining adequate dietary potassium, calcium, and magnesium intake. 6. Regular monitoring of the blood pressure. 7. Reduce sodium intake to less than 100 mmol/day (less than 2.3 gm of sodium or less than 6 gm of sodium choride)   This patient was seen by Leretha Pol, FNP- C in Collaboration with Dr Lavera Guise as a part of collaborative care agreement  Orders Placed This Encounter  Procedures  . Urinalysis, Routine w reflex microscopic  . Ambulatory referral to ENT    Meds ordered this encounter  Medications  . ibuprofen (ADVIL,MOTRIN) 800 MG tablet    Sig: Take 1 tablet (800 mg total) by mouth every 8 (eight) hours as needed for  moderate pain.    Dispense:  90 tablet    Refill:  1    Order Specific Question:   Supervising Provider    Answer:   Lavera Guise [[id number]]  . hydrochlorothiazide (HYDRODIURIL) 25 MG tablet    Sig: Take 1 tablet (25 mg total) by mouth daily.    Dispense:  30 tablet    Refill:  2    Please note increased dose    Order Specific Question:   Supervising Provider    Answer:   Lavera Guise [[id number]]  . neomycin-colistin-hydrocortisone-thonzonium (COLY-MYCIN S) 3.09-23-08-0.5 MG/ML OTIC suspension    Sig: Place 4 drops into both ears 3 (three) times daily.    Dispense:  10 mL  Refill:  3    Order Specific Question:   Supervising Provider    Answer:   Lavera Guise [[id number]]    Time spent: 62 Minutes     Dr Lavera Guise Internal medicine

## 2017-09-15 LAB — URINALYSIS, ROUTINE W REFLEX MICROSCOPIC
Bilirubin, UA: NEGATIVE
Glucose, UA: NEGATIVE
Ketones, UA: NEGATIVE
Leukocytes, UA: NEGATIVE
NITRITE UA: NEGATIVE
PH UA: 6.5 (ref 5.0–7.5)
PROTEIN UA: NEGATIVE
RBC, UA: NEGATIVE
Specific Gravity, UA: 1.017 (ref 1.005–1.030)
UUROB: 0.2 mg/dL (ref 0.2–1.0)

## 2017-09-26 DIAGNOSIS — M19022 Primary osteoarthritis, left elbow: Secondary | ICD-10-CM | POA: Insufficient documentation

## 2017-09-26 DIAGNOSIS — I10 Essential (primary) hypertension: Secondary | ICD-10-CM | POA: Insufficient documentation

## 2017-09-26 DIAGNOSIS — E1159 Type 2 diabetes mellitus with other circulatory complications: Secondary | ICD-10-CM | POA: Insufficient documentation

## 2017-10-01 DIAGNOSIS — M25522 Pain in left elbow: Secondary | ICD-10-CM | POA: Diagnosis not present

## 2017-10-04 DIAGNOSIS — R202 Paresthesia of skin: Secondary | ICD-10-CM | POA: Diagnosis not present

## 2017-10-15 DIAGNOSIS — R202 Paresthesia of skin: Secondary | ICD-10-CM | POA: Diagnosis not present

## 2017-10-18 ENCOUNTER — Ambulatory Visit: Payer: BLUE CROSS/BLUE SHIELD | Admitting: Nurse Practitioner

## 2017-10-18 ENCOUNTER — Encounter: Payer: Self-pay | Admitting: Nurse Practitioner

## 2017-10-18 VITALS — BP 140/100 | HR 78 | Resp 16 | Ht 71.0 in | Wt 261.0 lb

## 2017-10-18 DIAGNOSIS — I10 Essential (primary) hypertension: Secondary | ICD-10-CM

## 2017-10-18 DIAGNOSIS — E039 Hypothyroidism, unspecified: Secondary | ICD-10-CM

## 2017-10-18 DIAGNOSIS — Z6835 Body mass index (BMI) 35.0-35.9, adult: Secondary | ICD-10-CM

## 2017-10-18 DIAGNOSIS — E669 Obesity, unspecified: Secondary | ICD-10-CM

## 2017-10-18 DIAGNOSIS — M19022 Primary osteoarthritis, left elbow: Secondary | ICD-10-CM | POA: Diagnosis not present

## 2017-10-18 MED ORDER — PHENDIMETRAZINE TARTRATE ER 105 MG PO CP24: 1.0000 | ORAL_CAPSULE | Freq: Every day | ORAL | 1 refills | Status: DC

## 2017-10-18 NOTE — Progress Notes (Signed)
[facility] [place], [place] [address]  Internal MEDICINE  Office Visit Note  Patient Name: Ronald Mata  [id number]  [id number]  Date of Service: 11/11/2017  Chief Complaint  Patient presents with  . left arm pain    hurting over a month - seeing orthopedics and would like to get a second opinion.   . Hypertension    The patient is here for routine follow up visit. Blood pressure is somewhat improved from his last visit. Blood pressure is elevated. Is currently taking losartan 50mg  and HCTZ 12.5mg  daily. Denies chest pain, pressure, or shortness of breath. Having trouble with pain and inflammation of left elbow. Entire arm was swollen at one point. Had very limited ROM due to pain. He did see orthopedics. Question of gout vs. Synovitis. He was treated with cochecine and medrol dose pack. He has 2 days left with this. Uric acid level was normal. Gradually, pain and swelling is improving.  He has also had issue with chronic ear infection and swelling. Has been on antibiotics multiple times over past few months. He is traveling a lot with is job, mostly via air. Every trip seems to wrosen his symptoms. In addition to pain in the ear, he gets congested and has intermittent episodes of vertigo. We had discussed referral to ENT at his last visit, if symptoms were persistent.  He continues to maintain his weight witthout appetite suppressant. Is having a hard time increasing exercise due to intermittent pain in left elbow. Does follow  1500 calorie diet.    Pt is here for routine follow up.    Current Medication: Outpatient Encounter Medications as of 10/18/2017  Medication Sig Note  . baclofen (LIORESAL) 10 MG tablet Take 1 tablet (10 mg total) by mouth 3 (three) times daily. (Patient not taking: Reported on 08/16/2017)   . cetirizine (ZYRTEC) 10 MG tablet Take 1 tablet (10 mg total) by mouth daily.   . cyclobenzaprine (FLEXERIL) 10 MG tablet Take 1 tablet (10 mg  total) by mouth 3 (three) times daily as needed for muscle spasms. (Patient not taking: Reported on 08/16/2017)   . hydrochlorothiazide (HYDRODIURIL) 25 MG tablet Take 1 tablet (25 mg total) by mouth daily.   Marland Kitchen ibuprofen (ADVIL,MOTRIN) 800 MG tablet Take 1 tablet (800 mg total) by mouth every 8 (eight) hours as needed for moderate pain.   Marland Kitchen levothyroxine (SYNTHROID, LEVOTHROID) 50 MCG tablet Take 1 tablet (50 mcg total) by mouth daily before breakfast.   . losartan (COZAAR) 50 MG tablet Take 1 tablet (50 mg total) by mouth daily.   . naproxen (NAPROSYN) 500 MG tablet Take 1 tablet (500 mg total) by mouth 2 (two) times daily with a meal. (Patient not taking: Reported on 08/16/2017)   . neomycin-colistin-hydrocortisone-thonzonium (COLY-MYCIN S) 3.09-23-08-0.5 MG/ML OTIC suspension Place 4 drops into both ears 3 (three) times daily.   . Phendimetrazine Tartrate 105 MG CP24 Take 1 capsule (105 mg total) by mouth daily.   . [DISCONTINUED] Phendimetrazine Tartrate 105 MG CP24 Take 1 capsule (105 mg total) by mouth daily.   . [DISCONTINUED] phentermine (ADIPEX-P) 37.5 MG tablet  08/17/2014: Received from: External Pharmacy   No facility-administered encounter medications on file as of 10/18/2017.     Surgical History: Past Surgical History:  Procedure Laterality Date  . stomach wrap and achalasia      Medical History: Past Medical History:  Diagnosis Date  . Hypertension     Family History: Family History  Problem  Relation Age of Onset  . Diabetes Mother   . Hypertension Mother   . Hyperlipidemia Mother   . Stroke Mother   . Osteoarthritis Mother   . Diabetes Father   . Osteoarthritis Father   . Glaucoma Father     Social History   Socioeconomic History  . Marital status: Married    Spouse name: Not on file  . Number of children: Not on file  . Years of education: Not on file  . Highest education level: Not on file  Occupational History  . Not on file  Social Needs  . Financial  resource strain: Not on file  . Food insecurity:    Worry: Not on file    Inability: Not on file  . Transportation needs:    Medical: Not on file    Non-medical: Not on file  Tobacco Use  . Smoking status: Never Smoker  . Smokeless tobacco: Never Used  Substance and Sexual Activity  . Alcohol use: No    Alcohol/week: 0.0 oz  . Drug use: No  . Sexual activity: Not on file  Lifestyle  . Physical activity:    Days per week: Not on file    Minutes per session: Not on file  . Stress: Not on file  Relationships  . Social connections:    Talks on phone: Not on file    Gets together: Not on file    Attends religious service: Not on file    Active member of club or organization: Not on file    Attends meetings of clubs or organizations: Not on file    Relationship status: Not on file  . Intimate partner violence:    Fear of current or ex partner: Not on file    Emotionally abused: Not on file    Physically abused: Not on file    Forced sexual activity: Not on file  Other Topics Concern  . Not on file  Social History Narrative  . Not on file      Review of Systems  Constitutional: Negative for activity change, chills, fatigue and unexpected weight change.  HENT: Positive for ear discharge and ear pain. Negative for congestion, postnasal drip, rhinorrhea, sinus pressure, sneezing, sore throat, tinnitus and voice change.   Eyes: Negative.  Negative for redness.  Respiratory: Negative for cough, chest tightness and shortness of breath.   Cardiovascular: Negative for chest pain and palpitations.       Blood pressure improving.   Gastrointestinal: Negative for abdominal pain, constipation, diarrhea, nausea and vomiting.  Endocrine:       Stable hypothyroid  Genitourinary: Negative for dysuria and frequency.  Musculoskeletal: Positive for arthralgias. Negative for back pain, joint swelling and neck pain.       Left elbow  Skin: Negative for rash.  Allergic/Immunologic: Positive  for environmental allergies.  Neurological: Positive for dizziness. Negative for tremors and numbness.  Hematological: Negative for adenopathy. Does not bruise/bleed easily.  Psychiatric/Behavioral: Negative for behavioral problems (Depression), sleep disturbance and suicidal ideas. The patient is not nervous/anxious.     Today's Vitals   10/18/17 1607 10/18/17 1623  BP: (!) 149/100 (!) 140/100  Pulse: 78 78  Resp: 16 16  SpO2: 98% 98%  Weight: 261 lb (118.4 kg) 261 lb (118.4 kg)  Height: 5\' 11"  (1.803 m) 5\' 11"  (1.803 m)    Physical Exam  Constitutional: He is oriented to person, place, and time. He appears well-developed and well-nourished. No distress.  HENT:  Head: Normocephalic  and atraumatic.  Right Ear: Tympanic membrane is bulging.  Left Ear: Tympanic membrane is bulging.  Mouth/Throat: Oropharynx is clear and moist. No oropharyngeal exudate.  Eyes: Pupils are equal, round, and reactive to light. EOM are normal.  Neck: Normal range of motion. Neck supple. No JVD present. Carotid bruit is not present. No tracheal deviation present. No thyromegaly present.  Cardiovascular: Normal rate, regular rhythm, normal heart sounds and intact distal pulses. Exam reveals no gallop and no friction rub.  No murmur heard. Pulmonary/Chest: Effort normal and breath sounds normal. No respiratory distress. He has no wheezes. He has no rales. He exhibits no tenderness.  Abdominal: Soft. Bowel sounds are normal. There is no tenderness.  Musculoskeletal:  Pain in left elbow along medial and lateral aspect. Pain is more severe with flexion and extension of the left arm. ROM and strength is intact.   Lymphadenopathy:    He has no cervical adenopathy.  Neurological: He is alert and oriented to person, place, and time. No cranial nerve deficit.  Skin: Skin is warm and dry. He is not diaphoretic.  Psychiatric: He has a normal mood and affect. His behavior is normal. Judgment and thought content normal.   Nursing note and vitals reviewed.   Assessment/Plan: 1. Essential hypertension Blood pressure stable. Continue current blood pressure medication as prescribed.  2. Primary osteoarthritis of left elbow - Ambulatory referral to Orthopedic Surgery  3. Class 2 obesity with body mass index (BMI) of 35.0 to 35.9 in adult, unspecified obesity type, unspecified whether serious comorbidity present Restart bontril SR 105mg  daily. Limit calorie intake to 1500 calories per day. Gradually increase regular physical activity.  - Phendimetrazine Tartrate 105 MG CP24; Take 1 capsule (105 mg total) by mouth daily.  Dispense: 30 each; Refill: 1  4. Acquired hypothyroidism Stable. Continue levothyroxine as prescribed   General Counseling: Ikey verbalizes understanding of the findings of todays visit and agrees with plan of treatment. I have discussed any further diagnostic evaluation that may be needed or ordered today. We also reviewed his medications today. he has been encouraged to call the office with any questions or concerns that should arise related to todays visit.   There is a liability release in patients' chart. There has been a 10 minute discussion about the side effects including but not limited to elevated blood pressure, anxiety, lack of sleep and dry mouth. Pt understands and will like to start/continue on appetite suppressant at this time. There will be one month RX given at the time of visit with proper follow up. Nova diet plan with restricted calories is given to the pt. Pt understands and agrees with  plan of treatment    Orders Placed This Encounter  Procedures  . Ambulatory referral to Orthopedic Surgery    Meds ordered this encounter  Medications  . Phendimetrazine Tartrate 105 MG CP24    Sig: Take 1 capsule (105 mg total) by mouth daily.    Dispense:  30 each    Refill:  1    Order Specific Question:   Supervising Provider    Answer:   Lavera Guise [[id number]]    Time  spent: 10 Minutes     Dr Lavera Guise Internal medicine

## 2017-10-31 DIAGNOSIS — L02411 Cutaneous abscess of right axilla: Secondary | ICD-10-CM | POA: Diagnosis not present

## 2017-10-31 DIAGNOSIS — L723 Sebaceous cyst: Secondary | ICD-10-CM | POA: Diagnosis not present

## 2017-11-13 DIAGNOSIS — H6063 Unspecified chronic otitis externa, bilateral: Secondary | ICD-10-CM | POA: Diagnosis not present

## 2017-11-13 DIAGNOSIS — H6980 Other specified disorders of Eustachian tube, unspecified ear: Secondary | ICD-10-CM | POA: Diagnosis not present

## 2017-11-13 DIAGNOSIS — H93299 Other abnormal auditory perceptions, unspecified ear: Secondary | ICD-10-CM | POA: Diagnosis not present

## 2017-11-13 DIAGNOSIS — H6123 Impacted cerumen, bilateral: Secondary | ICD-10-CM | POA: Diagnosis not present

## 2017-11-30 ENCOUNTER — Ambulatory Visit: Payer: Self-pay | Admitting: Nurse Practitioner

## 2017-12-20 ENCOUNTER — Ambulatory Visit: Payer: Self-pay | Admitting: Nurse Practitioner

## 2018-01-08 ENCOUNTER — Other Ambulatory Visit: Payer: Self-pay

## 2018-01-08 DIAGNOSIS — I10 Essential (primary) hypertension: Secondary | ICD-10-CM

## 2018-01-08 MED ORDER — HYDROCHLOROTHIAZIDE 25 MG PO TABS: 25.0000 mg | ORAL_TABLET | Freq: Every day | ORAL | 1 refills | Status: DC

## 2018-01-09 ENCOUNTER — Other Ambulatory Visit: Payer: Self-pay

## 2018-01-09 DIAGNOSIS — I10 Essential (primary) hypertension: Secondary | ICD-10-CM

## 2018-01-09 MED ORDER — HYDROCHLOROTHIAZIDE 25 MG PO TABS: 25.0000 mg | ORAL_TABLET | Freq: Every day | ORAL | 1 refills | Status: DC

## 2018-02-11 DIAGNOSIS — M5410 Radiculopathy, site unspecified: Secondary | ICD-10-CM | POA: Diagnosis not present

## 2018-04-22 ENCOUNTER — Encounter: Payer: Self-pay | Admitting: Nurse Practitioner

## 2018-04-22 ENCOUNTER — Ambulatory Visit: Payer: Self-pay | Admitting: Nurse Practitioner

## 2018-04-22 ENCOUNTER — Telehealth: Payer: Self-pay

## 2018-04-22 VITALS — BP 161/116 | HR 77 | Temp 98.3°F | Resp 16 | Ht 71.0 in | Wt 255.0 lb

## 2018-04-22 DIAGNOSIS — M150 Primary generalized (osteo)arthritis: Secondary | ICD-10-CM | POA: Diagnosis not present

## 2018-04-22 DIAGNOSIS — I10 Essential (primary) hypertension: Secondary | ICD-10-CM | POA: Diagnosis not present

## 2018-04-22 DIAGNOSIS — M545 Low back pain, unspecified: Secondary | ICD-10-CM

## 2018-04-22 DIAGNOSIS — M5442 Lumbago with sciatica, left side: Secondary | ICD-10-CM

## 2018-04-22 MED ORDER — HYDROCODONE-ACETAMINOPHEN 5-325 MG PO TABS: 1.0000 | ORAL_TABLET | Freq: Three times a day (TID) | ORAL | 0 refills | Status: DC | PRN

## 2018-04-22 MED ORDER — PREDNISONE 10 MG (21) PO TBPK: ORAL_TABLET | ORAL | 0 refills | Status: DC

## 2018-04-22 MED ORDER — IBUPROFEN 800 MG PO TABS: 800.0000 mg | ORAL_TABLET | Freq: Three times a day (TID) | ORAL | 1 refills | Status: DC | PRN

## 2018-04-22 MED ORDER — TIZANIDINE HCL 4 MG PO TABS: 4.0000 mg | ORAL_TABLET | Freq: Every evening | ORAL | 1 refills | Status: DC | PRN

## 2018-04-22 NOTE — Progress Notes (Signed)
[facility] [place], [place] [address]  Internal MEDICINE  Office Visit Note  Patient Name: Ronald Mata  [id number]  [id number]  Date of Service: 04/23/2018   Pt is here for a sick visit.   Chief Complaint  Patient presents with  . Back Pain    pain lower back and shoots pain down leg      Back Pain  This is a recurrent problem. The current episode started 1 to 4 weeks ago. The problem occurs constantly. The problem has been gradually worsening since onset. The pain is present in the lumbar spine and sacro-iliac. The quality of the pain is described as aching, burning and cramping. The pain radiates to the right thigh and right foot. The pain is at a severity of 8/10. The pain is moderate. The pain is worse during the night. The symptoms are aggravated by sitting, lying down and bending. Stiffness is present at night. Associated symptoms include leg pain, paresthesias, tingling and weakness. Pertinent negatives include no abdominal pain, chest pain, dysuria, headaches or numbness. 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  He has tried NSAIDs, walking and muscle relaxant for the symptoms. The treatment provided no relief.        Current Medication:  Outpatient Encounter Medications as of 04/22/2018  Medication Sig Note  . cetirizine (ZYRTEC) 10 MG tablet Take 1 tablet (10 mg total) by mouth daily.   . hydrochlorothiazide (HYDRODIURIL) 25 MG tablet Take 1 tablet (25 mg total) by mouth daily.   Marland Kitchen levothyroxine (SYNTHROID, LEVOTHROID) 50 MCG tablet Take 1 tablet (50 mcg total) by mouth daily before breakfast.   . neomycin-colistin-hydrocortisone-thonzonium (COLY-MYCIN S) 3.09-23-08-0.5 MG/ML OTIC suspension Place 4 drops into both ears 3 (three) times daily.   Marland Kitchen HYDROcodone-acetaminophen (NORCO/VICODIN) 5-325 MG tablet Take 1 tablet by mouth 3 (three) times daily as needed for moderate pain.   Marland Kitchen ibuprofen (ADVIL,MOTRIN)  800 MG tablet Take 1 tablet (800 mg total) by mouth every 8 (eight) hours as needed for moderate pain.   Marland Kitchen losartan (COZAAR) 50 MG tablet Take 1 tablet (50 mg total) by mouth daily.   . Phendimetrazine Tartrate 105 MG CP24 Take 1 capsule (105 mg total) by mouth daily. (Patient not taking: Reported on 04/22/2018)   . predniSONE (STERAPRED UNI-PAK 21 TAB) 10 MG (21) TBPK tablet 6 day taper - take by mouth as directed for 6 days   . tiZANidine (ZANAFLEX) 4 MG tablet Take 1 tablet (4 mg total) by mouth at bedtime as needed for muscle spasms.   . [DISCONTINUED] baclofen (LIORESAL) 10 MG tablet Take 1 tablet (10 mg total) by mouth 3 (three) times daily. (Patient not taking: Reported on 08/16/2017)   . [DISCONTINUED] cyclobenzaprine (FLEXERIL) 10 MG tablet Take 1 tablet (10 mg total) by mouth 3 (three) times daily as needed for muscle spasms. (Patient not taking: Reported on 08/16/2017)   . [DISCONTINUED] ibuprofen (ADVIL,MOTRIN) 800 MG tablet Take 1 tablet (800 mg total) by mouth every 8 (eight) hours as needed for moderate pain. (Patient not taking: Reported on 04/22/2018)   . [DISCONTINUED] naproxen (NAPROSYN) 500 MG tablet Take 1 tablet (500 mg total) by mouth 2 (two) times daily with a meal. (Patient not taking: Reported on 08/16/2017)   . [DISCONTINUED] phentermine (ADIPEX-P) 37.5 MG tablet  08/17/2014: Received from: External Pharmacy   No facility-administered encounter medications on file as of 04/22/2018.       Medical History: Past Medical History:  Diagnosis Date  . Hypertension     Today's Vitals   04/22/18 1525  BP: (!) 161/116  Pulse: 77  Resp: 16  Temp: 98.3 F (36.8 C)  SpO2: 98%  Weight: 255 lb (115.7 kg)  Height: 5\' 11"  (1.803 m)    Review of Systems  Constitutional: Positive for activity change and fatigue. Negative for chills and unexpected weight change.  HENT: Negative for congestion, postnasal drip, rhinorrhea, sneezing and sore throat.   Eyes: Negative.  Negative for  redness.  Respiratory: Negative for cough, chest tightness, shortness of breath and wheezing.   Cardiovascular: Negative for chest pain and palpitations.       Blood pressure elevated today.   Gastrointestinal: Negative for abdominal pain, constipation, diarrhea, nausea and vomiting.  Endocrine: Negative for cold intolerance, heat intolerance, polydipsia, polyphagia and polyuria.  Genitourinary: Negative.  Negative for dysuria and frequency.  Musculoskeletal: Positive for back pain and myalgias. Negative for arthralgias, joint swelling and neck pain.  Skin: Negative for rash.  Neurological: Positive for tingling, weakness and paresthesias. Negative for dizziness, tremors, numbness and headaches.  Hematological: Negative for adenopathy. Does not bruise/bleed easily.  Psychiatric/Behavioral: Negative for behavioral problems (Depression), sleep disturbance and suicidal ideas. The patient is nervous/anxious.     Physical Exam  Constitutional: He is oriented to person, place, and time. He appears well-developed and well-nourished. No distress.  HENT:  Head: Normocephalic and atraumatic.  Nose: Nose normal.  Mouth/Throat: Oropharynx is clear and moist. No oropharyngeal exudate.  Eyes: Pupils are equal, round, and reactive to light. Conjunctivae and EOM are normal.  Neck: Normal range of motion. Neck supple. No JVD present. No tracheal deviation present. No thyromegaly present.  Cardiovascular: Normal rate, regular rhythm and normal heart sounds. Exam reveals no gallop and no friction rub.  No murmur heard. Pulmonary/Chest: Effort normal and breath sounds normal. No respiratory distress. He has no wheezes. He has no rales. He exhibits no tenderness.  Abdominal: Soft. Bowel sounds are normal. There is no tenderness.  Musculoskeletal:       Lumbar back: He exhibits decreased range of motion.  The patient has severe pain in the lower back. Unable to sit or stand still for longer than 30 seconds.  Grimacing noted with movement. No visible or palpable abnormalities noted today.   Lymphadenopathy:    He has no cervical adenopathy.  Neurological: He is alert and oriented to person, place, and time. No cranial nerve deficit.  Skin: Skin is warm and dry. He is not diaphoretic.  Psychiatric: He has a normal mood and affect. His behavior is normal. Judgment and thought content normal.  Nursing note and vitals reviewed.  Assessment/Plan: 1. Acute midline low back pain with left-sided sciatica Will get new x-ray of lumbar spine and refer to neurosurgery/orthopedics as indicated. Prednisone dose pack. Take as directed for 6 days. Short term prescription for hydrocodone/APAP 5/325mg  given, which may be taken up to three times daily if needed for severe pain. Reviewed risk factors and possible side effects associated with taking narcotic pain medications.  - DG Lumbar Spine Complete; Future - predniSONE (STERAPRED UNI-PAK 21 TAB) 10 MG (21) TBPK tablet; 6 day taper - take by mouth as directed for 6 days  Dispense: 21 tablet; Refill: 0 - HYDROcodone-acetaminophen (NORCO/VICODIN) 5-325 MG tablet; Take 1 tablet by mouth 3 (three) times daily as needed for moderate pain.  Dispense: 15 tablet; Refill: 0  2. Primary generalized hypertrophic osteoarthrosis May take ibuprofen 800mg  TID prn pain/inflammation.  -  ibuprofen (ADVIL,MOTRIN) 800 MG tablet; Take 1 tablet (800 mg total) by mouth every 8 (eight) hours as needed for moderate pain.  Dispense: 90 tablet; Refill: 1  3. Low back pain at multiple sites Also add tizanidine 4mg . May take 1/2 to 1 tablet at bedtime as needed for low back pain and muscle tightness.  - predniSONE (STERAPRED UNI-PAK 21 TAB) 10 MG (21) TBPK tablet; 6 day taper - take by mouth as directed for 6 days  Dispense: 21 tablet; Refill: 0 - tiZANidine (ZANAFLEX) 4 MG tablet; Take 1 tablet (4 mg total) by mouth at bedtime as needed for muscle spasms.  Dispense: 30 tablet; Refill: 1  4.  Essential hypertension Blood pressure elevated today, likely due to pain. No changes made to blood pressure medications. Will reassess at next visit.   General Counseling: Bryker verbalizes understanding of the findings of todays visit and agrees with plan of treatment. I have discussed any further diagnostic evaluation that may be needed or ordered today. We also reviewed his medications today. he has been encouraged to call the office with any questions or concerns that should arise related to todays visit.    Counseling:  Reviewed risks and possible side effects associated with taking opiates, benzodiazepines and other CNS depressants. Combination of these could cause dizziness and drowsiness. Advised patient not to drive or operate machinery when taking these medications, as patient's and other's life can be at risk and will have consequences. Patient verbalized understanding in this matter. Dependence and abuse for these drugs will be monitored closely. A Controlled substance policy and procedure is on file which allows [facility] to order a urine drug screen test at any visit. Patient understands and agrees with the plan  This patient was seen by Leretha Pol FNP Collaboration with Dr Lavera Guise as a part of collaborative care agreement  Orders Placed This Encounter  Procedures  . DG Lumbar Spine Complete    Meds ordered this encounter  Medications  . predniSONE (STERAPRED UNI-PAK 21 TAB) 10 MG (21) TBPK tablet    Sig: 6 day taper - take by mouth as directed for 6 days    Dispense:  21 tablet    Refill:  0    Order Specific Question:   Supervising Provider    Answer:   Lavera Guise [place]  . tiZANidine (ZANAFLEX) 4 MG tablet    Sig: Take 1 tablet (4 mg total) by mouth at bedtime as needed for muscle spasms.    Dispense:  30 tablet    Refill:  1    Order Specific Question:   Supervising Provider    Answer:   Lavera Guise [[id number]]  . HYDROcodone-acetaminophen  (NORCO/VICODIN) 5-325 MG tablet    Sig: Take 1 tablet by mouth 3 (three) times daily as needed for moderate pain.    Dispense:  15 tablet    Refill:  0    Order Specific Question:   Supervising Provider    Answer:   Lavera Guise [[id number]]  . ibuprofen (ADVIL,MOTRIN) 800 MG tablet    Sig: Take 1 tablet (800 mg total) by mouth every 8 (eight) hours as needed for moderate pain.    Dispense:  90 tablet    Refill:  1    Order Specific Question:   Supervising Provider    Answer:   Lavera Guise [[id number]]    Time spent: 15 Minutes

## 2018-04-23 DIAGNOSIS — M545 Low back pain, unspecified: Secondary | ICD-10-CM | POA: Insufficient documentation

## 2018-04-23 DIAGNOSIS — M5442 Lumbago with sciatica, left side: Secondary | ICD-10-CM | POA: Insufficient documentation

## 2018-04-23 NOTE — Telephone Encounter (Signed)
Patient advised x ray is walk in basis, order is at facility.Ronald Mata

## 2018-04-26 ENCOUNTER — Ambulatory Visit
Admission: RE | Admit: 2018-04-26 | Discharge: 2018-04-26 | Disposition: A | Discharge status: Home / Self Care | Payer: BLUE CROSS/BLUE SHIELD | Source: Ambulatory Visit | Attending: Nurse Practitioner | Admitting: Nurse Practitioner

## 2018-04-26 DIAGNOSIS — M545 Low back pain: Secondary | ICD-10-CM | POA: Diagnosis not present

## 2018-04-26 DIAGNOSIS — M5442 Lumbago with sciatica, left side: Secondary | ICD-10-CM | POA: Diagnosis not present

## 2018-04-26 DIAGNOSIS — M48061 Spinal stenosis, lumbar region without neurogenic claudication: Secondary | ICD-10-CM | POA: Diagnosis not present

## 2018-04-26 DIAGNOSIS — M47897 Other spondylosis, lumbosacral region: Secondary | ICD-10-CM | POA: Diagnosis not present

## 2018-05-07 ENCOUNTER — Encounter: Payer: Self-pay | Admitting: Nurse Practitioner

## 2018-05-07 ENCOUNTER — Ambulatory Visit (INDEPENDENT_AMBULATORY_CARE_PROVIDER_SITE_OTHER): Payer: Self-pay | Admitting: Nurse Practitioner

## 2018-05-07 VITALS — BP 130/82 | HR 74 | Resp 16 | Ht 71.0 in | Wt 260.0 lb

## 2018-05-07 DIAGNOSIS — I10 Essential (primary) hypertension: Secondary | ICD-10-CM | POA: Diagnosis not present

## 2018-05-07 DIAGNOSIS — Z6835 Body mass index (BMI) 35.0-35.9, adult: Secondary | ICD-10-CM

## 2018-05-07 DIAGNOSIS — M545 Low back pain, unspecified: Secondary | ICD-10-CM

## 2018-05-07 DIAGNOSIS — E039 Hypothyroidism, unspecified: Secondary | ICD-10-CM

## 2018-05-07 DIAGNOSIS — E669 Obesity, unspecified: Secondary | ICD-10-CM | POA: Diagnosis not present

## 2018-05-07 NOTE — Progress Notes (Signed)
[facility] [place], [place] [address]  Internal MEDICINE  Office Visit Note  Patient Name: Ronald Mata  [id number]  [id number]  Date of Service: 05/07/2018  Chief Complaint  Patient presents with  . Hypertension  . Follow-up    Xray   . Back Pain  . Numbness    right knee    The patient will need to have a waiver form completed for his health insurance filled out. He did not meed BMI requirements. Has been unable to get to the gym or exercise much due to severe back pain.   Back Pain  This is a recurrent problem. The current episode started 1 to 4 weeks ago. The problem occurs 2 to 4 times per day. The problem has been gradually improving since onset. The pain is present in the lumbar spine and sacro-iliac. The quality of the pain is described as aching, burning and cramping. The pain radiates to the right thigh and right foot. The pain is at a severity of 8/10. The pain is moderate. The pain is worse during the night. The symptoms are aggravated by sitting, lying down and bending. Stiffness is present at night. Associated symptoms include leg pain, paresthesias, tingling and weakness. Pertinent negatives include no abdominal pain, chest pain, dysuria, headaches or numbness. 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  He has tried NSAIDs, walking, muscle relaxant and analgesics (short term steroid taper.) for the symptoms. The treatment provided moderate relief.       Current Medication: Outpatient Encounter Medications as of 05/07/2018  Medication Sig  . cetirizine (ZYRTEC) 10 MG tablet Take 1 tablet (10 mg total) by mouth daily.  . hydrochlorothiazide (HYDRODIURIL) 25 MG tablet Take 1 tablet (25 mg total) by mouth daily.  Marland Kitchen HYDROcodone-acetaminophen (NORCO/VICODIN) 5-325 MG tablet Take 1 tablet by mouth 3 (three) times daily as needed for moderate pain.  Marland Kitchen ibuprofen (ADVIL,MOTRIN) 800 MG tablet Take 1 tablet (800  mg total) by mouth every 8 (eight) hours as needed for moderate pain.  Marland Kitchen levothyroxine (SYNTHROID, LEVOTHROID) 50 MCG tablet Take 1 tablet (50 mcg total) by mouth daily before breakfast.  . losartan (COZAAR) 50 MG tablet Take 1 tablet (50 mg total) by mouth daily.  Marland Kitchen neomycin-colistin-hydrocortisone-thonzonium (COLY-MYCIN S) 3.09-23-08-0.5 MG/ML OTIC suspension Place 4 drops into both ears 3 (three) times daily.  . Phendimetrazine Tartrate 105 MG CP24 Take 1 capsule (105 mg total) by mouth daily.  . predniSONE (STERAPRED UNI-PAK 21 TAB) 10 MG (21) TBPK tablet 6 day taper - take by mouth as directed for 6 days  . tiZANidine (ZANAFLEX) 4 MG tablet Take 1 tablet (4 mg total) by mouth at bedtime as needed for muscle spasms.   No facility-administered encounter medications on file as of 05/07/2018.     Surgical History: Past Surgical History:  Procedure Laterality Date  . stomach wrap and achalasia      Medical History: Past Medical History:  Diagnosis Date  . Hypertension     Family History: Family History  Problem Relation Age of Onset  . Diabetes Mother   . Hypertension Mother   . Hyperlipidemia Mother   . Stroke Mother   . Osteoarthritis Mother   . Diabetes Father   . Osteoarthritis Father   . Glaucoma Father     Social History   Socioeconomic History  . Marital status: Married    Spouse name: Not on file  . Number of children: Not on file  .  Years of education: Not on file  . Highest education level: Not on file  Occupational History  . Not on file  Social Needs  . Financial resource strain: Not on file  . Food insecurity:    Worry: Not on file    Inability: Not on file  . Transportation needs:    Medical: Not on file    Non-medical: Not on file  Tobacco Use  . Smoking status: Never Smoker  . Smokeless tobacco: Never Used  Substance and Sexual Activity  . Alcohol use: No    Alcohol/week: 0.0 standard drinks  . Drug use: No  . Sexual activity: Not on file   Lifestyle  . Physical activity:    Days per week: Not on file    Minutes per session: Not on file  . Stress: Not on file  Relationships  . Social connections:    Talks on phone: Not on file    Gets together: Not on file    Attends religious service: Not on file    Active member of club or organization: Not on file    Attends meetings of clubs or organizations: Not on file    Relationship status: Not on file  . Intimate partner violence:    Fear of current or ex partner: Not on file    Emotionally abused: Not on file    Physically abused: Not on file    Forced sexual activity: Not on file  Other Topics Concern  . Not on file  Social History Narrative  . Not on file      Review of Systems  Constitutional: Positive for activity change and fatigue. Negative for chills and unexpected weight change.  HENT: Negative for congestion, postnasal drip, rhinorrhea, sneezing and sore throat.   Eyes: Negative.  Negative for redness.  Respiratory: Negative for cough, chest tightness, shortness of breath and wheezing.   Cardiovascular: Negative for chest pain and palpitations.  Gastrointestinal: Negative for abdominal pain, constipation, diarrhea, nausea and vomiting.  Endocrine: Negative for cold intolerance, heat intolerance, polydipsia, polyphagia and polyuria.  Genitourinary: Negative.  Negative for dysuria and frequency.  Musculoskeletal: Positive for back pain and myalgias. Negative for arthralgias, joint swelling and neck pain.       Back pain is mild to moderate at this point. Patient finished prednisone taper which really helped the pain. Mostly taking ibuprofen and previously prescribed tramadol to help with pain. Only taking hydrocodone/APAP at night if needed. Tries to avoid this if possible.   Skin: Negative for rash.  Neurological: Positive for tingling, weakness and paresthesias. Negative for dizziness, tremors, numbness and headaches.  Hematological: Negative for adenopathy.  Does not bruise/bleed easily.  Psychiatric/Behavioral: Negative for behavioral problems (Depression), sleep disturbance and suicidal ideas. The patient is nervous/anxious.     Today's Vitals   05/07/18 0831  BP: 130/82  Pulse: 74  Resp: 16  SpO2: 97%  Weight: 260 lb (117.9 kg)  Height: 5\' 11"  (1.803 m)    Physical Exam  Constitutional: He is oriented to person, place, and time. He appears well-developed and well-nourished. No distress.  HENT:  Head: Normocephalic and atraumatic.  Nose: Nose normal.  Mouth/Throat: Oropharynx is clear and moist. No oropharyngeal exudate.  Eyes: Pupils are equal, round, and reactive to light. Conjunctivae and EOM are normal.  Neck: Normal range of motion. Neck supple. No JVD present. No tracheal deviation present. No thyromegaly present.  Cardiovascular: Normal rate, regular rhythm and normal heart sounds. Exam reveals no gallop and  no friction rub.  No murmur heard. Pulmonary/Chest: Effort normal and breath sounds normal. No respiratory distress. He has no wheezes. He has no rales. He exhibits no tenderness.  Abdominal: Soft. There is no tenderness.  Musculoskeletal:       Lumbar back: He exhibits decreased range of motion.  Back pain is mild to moderate now. Still worse with bending and twisting at the waist or lifting more than 10 pounds. No visible abnormalities or deformities are noted.   Lymphadenopathy:    He has no cervical adenopathy.  Neurological: He is alert and oriented to person, place, and time. No cranial nerve deficit.  Skin: Skin is warm and dry. He is not diaphoretic.  Psychiatric: He has a normal mood and affect. His behavior is normal. Judgment and thought content normal.  Nursing note and vitals reviewed.  Assessment/Plan: 1. Low back pain at multiple sites Improved. X-ray results reviewed with the patient. 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 MRI done back in 2006 did show severe,  multi-level degenerative disc disease with multiple disc bulges and protrusions. He should continue to take NSAIDs and muscle relaxer as needed and as prescribed. Use narcotic pain reliever only if needed at night. Refer to spine specialist for further evaluation ad treatment.  - Ambulatory referral to Orthopedic Surgery  2. Essential hypertension Much better today. Continue bp medication as prescribed   3. Acquired hypothyroidism Thyroid panel stable. Continue levothyroxine as prescribed   4. Class 2 obesity with body mass index (BMI) of 35.0 to 35.9 in adult, unspecified obesity type, unspecified whether serious comorbidity present Will complete insurance waiver and return to patient when ready. Will continue to participate in medically managed weight loss program and will monitored closely.   General Counseling: Goldie verbalizes understanding of the findings of todays visit and agrees with plan of treatment. I have discussed any further diagnostic evaluation that may be needed or ordered today. We also reviewed his medications today. he has been encouraged to call the office with any questions or concerns that should arise related to todays visit.    Orders Placed This Encounter  Procedures  . Ambulatory referral to Orthopedic Surgery   Obesity Counseling: Risk Assessment: An assessment of behavioral risk factors was made today and includes lack of exercise sedentary lifestyle, lack of portion control and poor dietary habits.  Risk Modification Advice: She was counseled on portion control guidelines. Restricting daily caloric intake to. . The detrimental long term effects of obesity on her health and ongoing poor compliance was also discussed with the patient.  This patient was seen by Leretha Pol FNP Collaboration with Dr Lavera Guise as a part of collaborative care agreement   Time spent: 25 Minutes      Dr Lavera Guise Internal medicine

## 2018-05-08 ENCOUNTER — Telehealth: Payer: Self-pay | Admitting: Nurse Practitioner

## 2018-05-08 NOTE — Telephone Encounter (Signed)
I completed this and put it on your desk.

## 2018-05-31 DIAGNOSIS — M5416 Radiculopathy, lumbar region: Secondary | ICD-10-CM | POA: Diagnosis not present

## 2018-06-11 ENCOUNTER — Other Ambulatory Visit: Payer: Self-pay | Admitting: Neurological Surgery

## 2018-06-11 DIAGNOSIS — M5416 Radiculopathy, lumbar region: Secondary | ICD-10-CM

## 2018-06-24 ENCOUNTER — Ambulatory Visit
Admission: RE | Admit: 2018-06-24 | Discharge: 2018-06-24 | Disposition: A | Discharge status: Home / Self Care | Payer: BLUE CROSS/BLUE SHIELD | Source: Ambulatory Visit | Attending: Neurological Surgery | Admitting: Neurological Surgery

## 2018-06-24 DIAGNOSIS — M48061 Spinal stenosis, lumbar region without neurogenic claudication: Secondary | ICD-10-CM | POA: Diagnosis not present

## 2018-06-24 DIAGNOSIS — M5416 Radiculopathy, lumbar region: Secondary | ICD-10-CM

## 2018-06-29 DIAGNOSIS — J0140 Acute pansinusitis, unspecified: Secondary | ICD-10-CM | POA: Diagnosis not present

## 2018-08-01 DIAGNOSIS — Z6837 Body mass index (BMI) 37.0-37.9, adult: Secondary | ICD-10-CM | POA: Diagnosis not present

## 2018-08-01 DIAGNOSIS — I10 Essential (primary) hypertension: Secondary | ICD-10-CM | POA: Diagnosis not present

## 2018-08-01 DIAGNOSIS — M5416 Radiculopathy, lumbar region: Secondary | ICD-10-CM | POA: Diagnosis not present

## 2018-09-09 ENCOUNTER — Other Ambulatory Visit: Payer: Self-pay

## 2018-09-09 DIAGNOSIS — J309 Allergic rhinitis, unspecified: Secondary | ICD-10-CM

## 2018-09-09 MED ORDER — CETIRIZINE HCL 10 MG PO TABS: 10.0000 mg | ORAL_TABLET | Freq: Every day | ORAL | 1 refills | Status: DC

## 2018-10-10 ENCOUNTER — Other Ambulatory Visit: Payer: Self-pay

## 2018-10-10 DIAGNOSIS — E039 Hypothyroidism, unspecified: Secondary | ICD-10-CM

## 2018-10-10 MED ORDER — LEVOTHYROXINE SODIUM 50 MCG PO TABS: 50.0000 ug | ORAL_TABLET | Freq: Every day | ORAL | 1 refills | Status: DC

## 2018-10-29 ENCOUNTER — Other Ambulatory Visit: Payer: Self-pay

## 2018-10-29 ENCOUNTER — Ambulatory Visit (INDEPENDENT_AMBULATORY_CARE_PROVIDER_SITE_OTHER): Payer: BC Managed Care – PPO | Admitting: Adult Health

## 2018-10-29 ENCOUNTER — Other Ambulatory Visit: Payer: Self-pay | Admitting: Adult Health

## 2018-10-29 ENCOUNTER — Encounter: Payer: Self-pay | Admitting: Adult Health

## 2018-10-29 DIAGNOSIS — I10 Essential (primary) hypertension: Secondary | ICD-10-CM | POA: Diagnosis not present

## 2018-10-29 DIAGNOSIS — J309 Allergic rhinitis, unspecified: Secondary | ICD-10-CM | POA: Diagnosis not present

## 2018-10-29 DIAGNOSIS — E039 Hypothyroidism, unspecified: Secondary | ICD-10-CM

## 2018-10-29 MED ORDER — LEVOTHYROXINE SODIUM 50 MCG PO TABS: 50.0000 ug | ORAL_TABLET | Freq: Every day | ORAL | 2 refills | Status: DC

## 2018-10-29 MED ORDER — HYDROCHLOROTHIAZIDE 25 MG PO TABS: 25.0000 mg | ORAL_TABLET | Freq: Every day | ORAL | 2 refills | Status: DC

## 2018-10-29 MED ORDER — LOSARTAN POTASSIUM 50 MG PO TABS: 50.0000 mg | ORAL_TABLET | Freq: Every day | ORAL | 3 refills | Status: DC

## 2018-10-29 MED ORDER — CETIRIZINE HCL 10 MG PO TABS: 10.0000 mg | ORAL_TABLET | Freq: Every day | ORAL | 2 refills | Status: DC

## 2018-10-29 NOTE — Progress Notes (Signed)
[facility] [place], [place] [address]  Internal MEDICINE  Telephone Visit  Patient Name: Ronald Mata  [id number]  [id number]  Date of Service: 10/29/2018  I connected with the patient at [phone] by Video and verified the patients identity using two identifiers.   I discussed the limitations, risks, security and privacy concerns of performing an evaluation and management service by telephone and the availability of in person appointments. I also discussed with the patient that there may be a patient responsible charge related to the service.  The patient expressed understanding and agrees to proceed.    Chief Complaint  Patient presents with  . Telephone Screen  . Hypertension    bp elevated medication refills needs 90 day supply  . Telephone Assessment    HPI  PT is being seen via video for medication refill on his blood pressure medication. His blood pressure is elevated at this time 143/102 because he has not had his medication for over a week.  He denies chest pain, SOB, palpitations or headaches currently. He is also in need of some refills for other medications.    Current Medication: Outpatient Encounter Medications as of 10/29/2018  Medication Sig  . cetirizine (ZYRTEC) 10 MG tablet Take 1 tablet (10 mg total) by mouth daily.  . hydrochlorothiazide (HYDRODIURIL) 25 MG tablet Take 1 tablet (25 mg total) by mouth daily.  Marland Kitchen HYDROcodone-acetaminophen (NORCO/VICODIN) 5-325 MG tablet Take 1 tablet by mouth 3 (three) times daily as needed for moderate pain.  Marland Kitchen ibuprofen (ADVIL,MOTRIN) 800 MG tablet Take 1 tablet (800 mg total) by mouth every 8 (eight) hours as needed for moderate pain.  Marland Kitchen levothyroxine (SYNTHROID, LEVOTHROID) 50 MCG tablet Take 1 tablet (50 mcg total) by mouth daily before breakfast.  . losartan (COZAAR) 50 MG tablet Take 1 tablet (50 mg total) by mouth daily.  . [DISCONTINUED] cetirizine (ZYRTEC) 10 MG tablet Take 1 tablet (10 mg total) by  mouth daily.  . [DISCONTINUED] hydrochlorothiazide (HYDRODIURIL) 25 MG tablet Take 1 tablet (25 mg total) by mouth daily.  . [DISCONTINUED] levothyroxine (SYNTHROID, LEVOTHROID) 50 MCG tablet Take 1 tablet (50 mcg total) by mouth daily before breakfast.  . [DISCONTINUED] losartan (COZAAR) 50 MG tablet Take 1 tablet (50 mg total) by mouth daily.  . Phendimetrazine Tartrate 105 MG CP24 Take 1 capsule (105 mg total) by mouth daily. (Patient not taking: Reported on 10/29/2018)  . [DISCONTINUED] neomycin-colistin-hydrocortisone-thonzonium (COLY-MYCIN S) 3.09-23-08-0.5 MG/ML OTIC suspension Place 4 drops into both ears 3 (three) times daily. (Patient not taking: Reported on 10/29/2018)  . [DISCONTINUED] predniSONE (STERAPRED UNI-PAK 21 TAB) 10 MG (21) TBPK tablet 6 day taper - take by mouth as directed for 6 days (Patient not taking: Reported on 10/29/2018)  . [DISCONTINUED] tiZANidine (ZANAFLEX) 4 MG tablet Take 1 tablet (4 mg total) by mouth at bedtime as needed for muscle spasms. (Patient not taking: Reported on 10/29/2018)   No facility-administered encounter medications on file as of 10/29/2018.     Surgical History: Past Surgical History:  Procedure Laterality Date  . stomach wrap and achalasia      Medical History: Past Medical History:  Diagnosis Date  . Hypertension   . Thyroid disease     Family History: Family History  Problem Relation Age of Onset  . Diabetes Mother   . Hypertension Mother   . Hyperlipidemia Mother   . Stroke Mother   . Osteoarthritis Mother   . Diabetes Father   . Osteoarthritis Father   .  Glaucoma Father     Social History   Socioeconomic History  . Marital status: Married    Spouse name: Not on file  . Number of children: Not on file  . Years of education: Not on file  . Highest education level: Not on file  Occupational History  . Not on file  Social Needs  . Financial resource strain: Not on file  . Food insecurity:    Worry: Not on file     Inability: Not on file  . Transportation needs:    Medical: Not on file    Non-medical: Not on file  Tobacco Use  . Smoking status: Never Smoker  . Smokeless tobacco: Never Used  Substance and Sexual Activity  . Alcohol use: No    Alcohol/week: 0.0 standard drinks  . Drug use: No  . Sexual activity: Not on file  Lifestyle  . Physical activity:    Days per week: Not on file    Minutes per session: Not on file  . Stress: Not on file  Relationships  . Social connections:    Talks on phone: Not on file    Gets together: Not on file    Attends religious service: Not on file    Active member of club or organization: Not on file    Attends meetings of clubs or organizations: Not on file    Relationship status: Not on file  . Intimate partner violence:    Fear of current or ex partner: Not on file    Emotionally abused: Not on file    Physically abused: Not on file    Forced sexual activity: Not on file  Other Topics Concern  . Not on file  Social History Narrative  . Not on file      Review of Systems  Constitutional: Negative.  Negative for chills, fatigue and unexpected weight change.  HENT: Negative.  Negative for congestion, rhinorrhea, sneezing and sore throat.   Eyes: Negative for redness.  Respiratory: Negative.  Negative for cough, chest tightness and shortness of breath.   Cardiovascular: Negative.  Negative for chest pain and palpitations.  Gastrointestinal: Negative.  Negative for abdominal pain, constipation, diarrhea, nausea and vomiting.  Endocrine: Negative.   Genitourinary: Negative.  Negative for dysuria and frequency.  Musculoskeletal: Negative.  Negative for arthralgias, back pain, joint swelling and neck pain.  Skin: Negative.  Negative for rash.  Allergic/Immunologic: Negative.   Neurological: Negative.  Negative for tremors and numbness.  Hematological: Negative for adenopathy. Does not bruise/bleed easily.  Psychiatric/Behavioral: Negative.   Negative for behavioral problems, sleep disturbance and suicidal ideas. The patient is not nervous/anxious.     Vital Signs: BP (!) 143/102   Pulse 63   Resp 16   Ht 5\' 11"  (1.803 m)   Wt 248 lb (112.5 kg)   BMI 34.59 kg/m    Observation/Objective:  Well appearing with no distress noted.  Able to converse with ease.  Denies current issues.  Pleasant and cooperative.    Assessment/Plan: 1. Essential hypertension PT has been without meds for one week.  Refilled at this time.  Encouraged patient to take medication as soon as possible.  Continue to monitor bp at home, and follow up if bp does not decrease back to normal range with medication, or if any new symptoms develop.   - losartan (COZAAR) 50 MG tablet; Take 1 tablet (50 mg total) by mouth daily.  Dispense: 90 tablet; Refill: 3 - hydrochlorothiazide (HYDRODIURIL) 25 MG tablet;  Take 1 tablet (25 mg total) by mouth daily.  Dispense: 90 tablet; Refill: 2  2. Acquired hypothyroidism Refilled Synthroid, continue present therapy.  - levothyroxine (SYNTHROID, LEVOTHROID) 50 MCG tablet; Take 1 tablet (50 mcg total) by mouth daily before breakfast.  Dispense: 90 tablet; Refill: 2  3. Allergic rhinitis, unspecified seasonality, unspecified trigger Continue present dose of zyrtec. - cetirizine (ZYRTEC) 10 MG tablet; Take 1 tablet (10 mg total) by mouth daily.  Dispense: 90 tablet; Refill: 2  General Counseling: Caylon verbalizes understanding of the findings of today's phone visit and agrees with plan of treatment. I have discussed any further diagnostic evaluation that may be needed or ordered today. We also reviewed his medications today. he has been encouraged to call the office with any questions or concerns that should arise related to todays visit.    No orders of the defined types were placed in this encounter.   Meds ordered this encounter  Medications  . losartan (COZAAR) 50 MG tablet    Sig: Take 1 tablet (50 mg total) by  mouth daily.    Dispense:  90 tablet    Refill:  3  . levothyroxine (SYNTHROID, LEVOTHROID) 50 MCG tablet    Sig: Take 1 tablet (50 mcg total) by mouth daily before breakfast.    Dispense:  90 tablet    Refill:  2  . cetirizine (ZYRTEC) 10 MG tablet    Sig: Take 1 tablet (10 mg total) by mouth daily.    Dispense:  90 tablet    Refill:  2  . hydrochlorothiazide (HYDRODIURIL) 25 MG tablet    Sig: Take 1 tablet (25 mg total) by mouth daily.    Dispense:  90 tablet    Refill:  2    Please note increased dose    Time spent: 12 Minutes    Orson Gear AGNP-C Internal medicine

## 2018-10-29 NOTE — Patient Instructions (Signed)

## 2018-10-31 ENCOUNTER — Other Ambulatory Visit: Payer: Self-pay

## 2018-10-31 DIAGNOSIS — I10 Essential (primary) hypertension: Secondary | ICD-10-CM

## 2018-10-31 MED ORDER — LOSARTAN POTASSIUM 100 MG PO TABS: ORAL_TABLET | ORAL | 1 refills | Status: DC

## 2018-10-31 NOTE — Telephone Encounter (Signed)
Pt wife advised we change losartan 50 to losartan 100 take 1/2  Tab daily and send to [facility] due to backorder for losartan 50

## 2019-01-23 ENCOUNTER — Other Ambulatory Visit: Payer: Self-pay

## 2019-01-23 ENCOUNTER — Ambulatory Visit: Payer: BC Managed Care – PPO | Admitting: Adult Health

## 2019-01-23 ENCOUNTER — Telehealth: Payer: Self-pay | Admitting: *Deleted

## 2019-01-23 ENCOUNTER — Encounter: Payer: Self-pay | Admitting: Adult Health

## 2019-01-23 VITALS — BP 144/90 | HR 74 | Temp 96.9°F | Wt 254.0 lb

## 2019-01-23 DIAGNOSIS — M2540 Effusion, unspecified joint: Secondary | ICD-10-CM

## 2019-01-23 DIAGNOSIS — I10 Essential (primary) hypertension: Secondary | ICD-10-CM

## 2019-01-23 DIAGNOSIS — Z20822 Contact with and (suspected) exposure to covid-19: Secondary | ICD-10-CM

## 2019-01-23 DIAGNOSIS — Z20828 Contact with and (suspected) exposure to other viral communicable diseases: Secondary | ICD-10-CM

## 2019-01-23 NOTE — Progress Notes (Signed)
[facility] [place], [place] [address]  Internal MEDICINE  Telephone Visit  Patient Name: Ronald Mata  [id number]  [id number]  Date of Service: 01/23/2019  I connected with the patient at 950 by telephone and verified the patients identity using two identifiers.   I discussed the limitations, risks, security and privacy concerns of performing an evaluation and management service by telephone and the availability of in person appointments. I also discussed with the patient that there may be a patient responsible charge related to the service.  The patient expressed understanding and agrees to proceed.    Chief Complaint  Patient presents with  . Telephone Assessment  . Telephone Screen  . Medical Management of Chronic Issues    EXPOSURES TO COVID 19    HPI  PT reports a member of his church group has tested positive. He nor his family have had any symptoms, but he is concerned due to being at work with the public, and his wifes underlying conditions.  He would like to be tested, and antibody tested.  Also patient mentions that he has had intermittent painful selling of his ankle joint, knee joint and sometimes left elbow.  He has been tested for gout, however nothing was conclusive.  He is interested in some testing for autoimmune issues.    Current Medication: Outpatient Encounter Medications as of 01/23/2019  Medication Sig  . cetirizine (ZYRTEC) 10 MG tablet Take 1 tablet (10 mg total) by mouth daily.  . hydrochlorothiazide (HYDRODIURIL) 25 MG tablet Take 1 tablet (25 mg total) by mouth daily.  Marland Kitchen HYDROcodone-acetaminophen (NORCO/VICODIN) 5-325 MG tablet Take 1 tablet by mouth 3 (three) times daily as needed for moderate pain.  Marland Kitchen ibuprofen (ADVIL,MOTRIN) 800 MG tablet Take 1 tablet (800 mg total) by mouth every 8 (eight) hours as needed for moderate pain.  Marland Kitchen levothyroxine (SYNTHROID, LEVOTHROID) 50 MCG tablet Take 1 tablet (50 mcg total) by mouth daily before  breakfast.  . losartan (COZAAR) 100 MG tablet Take 1/2 tab po daily  . [DISCONTINUED] Phendimetrazine Tartrate 105 MG CP24 Take 1 capsule (105 mg total) by mouth daily. (Patient not taking: Reported on 01/23/2019)   No facility-administered encounter medications on file as of 01/23/2019.     Surgical History: Past Surgical History:  Procedure Laterality Date  . stomach wrap and achalasia      Medical History: Past Medical History:  Diagnosis Date  . Hypertension   . Thyroid disease     Family History: Family History  Problem Relation Age of Onset  . Diabetes Mother   . Hypertension Mother   . Hyperlipidemia Mother   . Stroke Mother   . Osteoarthritis Mother   . Diabetes Father   . Osteoarthritis Father   . Glaucoma Father     Social History   Socioeconomic History  . Marital status: Married    Spouse name: Not on file  . Number of children: Not on file  . Years of education: Not on file  . Highest education level: Not on file  Occupational History  . Not on file  Social Needs  . Financial resource strain: Not on file  . Food insecurity    Worry: Not on file    Inability: Not on file  . Transportation needs    Medical: Not on file    Non-medical: Not on file  Tobacco Use  . Smoking status: Never Smoker  . Smokeless tobacco: Never Used  Substance and Sexual Activity  .  Alcohol use: No    Alcohol/week: 0.0 standard drinks  . Drug use: No  . Sexual activity: Not on file  Lifestyle  . Physical activity    Days per week: Not on file    Minutes per session: Not on file  . Stress: Not on file  Relationships  . Social Herbalist on phone: Not on file    Gets together: Not on file    Attends religious service: Not on file    Active member of club or organization: Not on file    Attends meetings of clubs or organizations: Not on file    Relationship status: Not on file  . Intimate partner violence    Fear of current or ex partner: Not on file     Emotionally abused: Not on file    Physically abused: Not on file    Forced sexual activity: Not on file  Other Topics Concern  . Not on file  Social History Narrative  . Not on file      Review of Systems  Constitutional: Negative.  Negative for chills, fatigue and unexpected weight change.  HENT: Negative.  Negative for congestion, rhinorrhea, sneezing and sore throat.   Eyes: Negative for redness.  Respiratory: Negative.  Negative for cough, chest tightness and shortness of breath.   Cardiovascular: Negative.  Negative for chest pain and palpitations.  Gastrointestinal: Negative.  Negative for abdominal pain, constipation, diarrhea, nausea and vomiting.  Endocrine: Negative.   Genitourinary: Negative.  Negative for dysuria and frequency.  Musculoskeletal: Negative.  Negative for arthralgias, back pain, joint swelling and neck pain.  Skin: Negative.  Negative for rash.  Allergic/Immunologic: Negative.   Neurological: Negative.  Negative for tremors and numbness.  Hematological: Negative for adenopathy. Does not bruise/bleed easily.  Psychiatric/Behavioral: Negative.  Negative for behavioral problems, sleep disturbance and suicidal ideas. The patient is not nervous/anxious.     Vital Signs: BP (!) 144/90   Pulse 74   Temp (!) 96.9 F (36.1 C)   Wt 254 lb (115.2 kg)   BMI 35.43 kg/m    Observation/Objective:  Well appearing, NAD noted.    Assessment/Plan: 1. Exposure to Covid-19 Virus Pt is employee of [employer], and antibody test is ordered.   - SARS-CoV-2 Antibody, IgM  2. Painful swelling of joint Pt has been tested for gout. Will test for RA, and other basic autoimmune labs.    3. Essential hypertension Ps bp is elevated.  Will continue to monitor at home and a future visits.    General Counseling: Diangelo verbalizes understanding of the findings of today's phone visit and agrees with plan of treatment. I have discussed any further diagnostic evaluation that may  be needed or ordered today. We also reviewed his medications today. he has been encouraged to call the office with any questions or concerns that should arise related to todays visit.    Orders Placed This Encounter  Procedures  . SARS-CoV-2 Antibody, IgM    No orders of the defined types were placed in this encounter.   Time spent: [place] [facility] Internal medicine

## 2019-01-23 NOTE — Telephone Encounter (Signed)
-----   Message from Corlis Hove sent at 01/23/2019  9:47 AM EDT ----- PLEASE CALL PT AND SCHEDULE APPT PT EXPOSURE TO COVID

## 2019-01-23 NOTE — Telephone Encounter (Signed)
Pt scheduled for covid testing today @ 11:45 @ [facility]. Instructions given and order placed

## 2019-01-27 DIAGNOSIS — M2540 Effusion, unspecified joint: Secondary | ICD-10-CM | POA: Diagnosis not present

## 2019-01-27 DIAGNOSIS — Z20828 Contact with and (suspected) exposure to other viral communicable diseases: Secondary | ICD-10-CM | POA: Diagnosis not present

## 2019-01-28 LAB — SEDIMENTATION RATE: Sed Rate: 43 mm/hr — ABNORMAL HIGH (ref 0–15)

## 2019-01-28 LAB — ANA W/REFLEX IF POSITIVE: Anti Nuclear Antibody (ANA): NEGATIVE

## 2019-01-28 LAB — RHEUMATOID FACTOR: Rhuematoid fact SerPl-aCnc: <10 IU/mL (ref 0.0–13.9)

## 2019-01-28 LAB — URIC ACID: Uric Acid: 9.3 mg/dL — ABNORMAL HIGH (ref 3.7–8.6)

## 2019-01-29 LAB — NOVEL CORONAVIRUS, NAA: SARS-CoV-2, NAA: NOT DETECTED

## 2019-01-29 LAB — SARS-COV-2 ANTIBODY, IGM: SARS-CoV-2 Antibody, IgM: NEGATIVE

## 2019-01-31 ENCOUNTER — Encounter: Payer: Self-pay | Admitting: Nurse Practitioner

## 2019-01-31 ENCOUNTER — Other Ambulatory Visit: Payer: Self-pay

## 2019-01-31 ENCOUNTER — Ambulatory Visit: Payer: BC Managed Care – PPO | Admitting: Nurse Practitioner

## 2019-01-31 VITALS — BP 138/59 | HR 60 | Ht 71.0 in | Wt 260.0 lb

## 2019-01-31 DIAGNOSIS — Z125 Encounter for screening for malignant neoplasm of prostate: Secondary | ICD-10-CM | POA: Diagnosis not present

## 2019-01-31 DIAGNOSIS — I10 Essential (primary) hypertension: Secondary | ICD-10-CM | POA: Diagnosis not present

## 2019-01-31 DIAGNOSIS — M1A29X1 Drug-induced chronic gout, multiple sites, with tophus (tophi): Secondary | ICD-10-CM

## 2019-01-31 DIAGNOSIS — M150 Primary generalized (osteo)arthritis: Secondary | ICD-10-CM | POA: Insufficient documentation

## 2019-01-31 DIAGNOSIS — M1A09X0 Idiopathic chronic gout, multiple sites, without tophus (tophi): Secondary | ICD-10-CM

## 2019-01-31 DIAGNOSIS — Z0001 Encounter for general adult medical examination with abnormal findings: Secondary | ICD-10-CM | POA: Insufficient documentation

## 2019-01-31 DIAGNOSIS — E039 Hypothyroidism, unspecified: Secondary | ICD-10-CM

## 2019-01-31 MED ORDER — FEBUXOSTAT 40 MG PO TABS: 40.0000 mg | ORAL_TABLET | Freq: Every day | ORAL | 3 refills | Status: DC

## 2019-01-31 MED ORDER — IBUPROFEN 800 MG PO TABS: 800.0000 mg | ORAL_TABLET | Freq: Three times a day (TID) | ORAL | 1 refills | Status: DC | PRN

## 2019-01-31 NOTE — Progress Notes (Signed)
[facility] [place], [place] [address]  Internal MEDICINE  Telephone Visit  Patient Name: Ronald Mata  [id number]  [id number]  Date of Service: 01/31/2019  I connected with the patient at 9:23 by webcam and verified the patients identity using two identifiers.   I discussed the limitations, risks, security and privacy concerns of performing an evaluation and management service by webcam and the availability of in person appointments. I also discussed with the patient that there may be a patient responsible charge related to the service.  The patient expressed understanding and agrees to proceed.    Chief Complaint  Patient presents with  . Medical Management of Chronic Issues  . Hypertension    The patient has been contacted via webcam for follow up visit due to concerns for spread of novel coronavirus. The patient has been having pain and inflammation of different joints. Started with great toe on left side. Lasted for a few weeks and resolved. Moved into his knee, then into his elbow. Had labs drawn prior to this visit. Showed elevated levels of uric acid and elevated sed rate. His RA level and ANA were both normal.       Current Medication: Outpatient Encounter Medications as of 01/31/2019  Medication Sig  . cetirizine (ZYRTEC) 10 MG tablet Take 1 tablet (10 mg total) by mouth daily.  . hydrochlorothiazide (HYDRODIURIL) 25 MG tablet Take 1 tablet (25 mg total) by mouth daily.  Marland Kitchen ibuprofen (ADVIL) 800 MG tablet Take 1 tablet (800 mg total) by mouth every 8 (eight) hours as needed for moderate pain.  Marland Kitchen levothyroxine (SYNTHROID, LEVOTHROID) 50 MCG tablet Take 1 tablet (50 mcg total) by mouth daily before breakfast.  . losartan (COZAAR) 100 MG tablet Take 1/2 tab po daily  . [DISCONTINUED] ibuprofen (ADVIL,MOTRIN) 800 MG tablet Take 1 tablet (800 mg total) by mouth every 8 (eight) hours as needed for moderate pain.  . febuxostat (ULORIC) 40 MG tablet Take 1  tablet (40 mg total) by mouth daily.  Marland Kitchen HYDROcodone-acetaminophen (NORCO/VICODIN) 5-325 MG tablet Take 1 tablet by mouth 3 (three) times daily as needed for moderate pain. (Patient not taking: Reported on 01/31/2019)   No facility-administered encounter medications on file as of 01/31/2019.     Surgical History: Past Surgical History:  Procedure Laterality Date  . stomach wrap and achalasia      Medical History: Past Medical History:  Diagnosis Date  . Hypertension   . Thyroid disease     Family History: Family History  Problem Relation Age of Onset  . Diabetes Mother   . Hypertension Mother   . Hyperlipidemia Mother   . Stroke Mother   . Osteoarthritis Mother   . Diabetes Father   . Osteoarthritis Father   . Glaucoma Father     Social History   Socioeconomic History  . Marital status: Married    Spouse name: Not on file  . Number of children: Not on file  . Years of education: Not on file  . Highest education level: Not on file  Occupational History  . Not on file  Social Needs  . Financial resource strain: Not on file  . Food insecurity    Worry: Not on file    Inability: Not on file  . Transportation needs    Medical: Not on file    Non-medical: Not on file  Tobacco Use  . Smoking status: Never Smoker  . Smokeless tobacco: Never Used  Substance and Sexual  Activity  . Alcohol use: No    Alcohol/week: 0.0 standard drinks  . Drug use: No  . Sexual activity: Not on file  Lifestyle  . Physical activity    Days per week: Not on file    Minutes per session: Not on file  . Stress: Not on file  Relationships  . Social Herbalist on phone: Not on file    Gets together: Not on file    Attends religious service: Not on file    Active member of club or organization: Not on file    Attends meetings of clubs or organizations: Not on file    Relationship status: Not on file  . Intimate partner violence    Fear of current or ex partner: Not on file     Emotionally abused: Not on file    Physically abused: Not on file    Forced sexual activity: Not on file  Other Topics Concern  . Not on file  Social History Narrative  . Not on file      Review of Systems  Constitutional: Negative for chills, fatigue and unexpected weight change.       Weight gain of 5 pounds since last visit.   HENT: Negative for congestion, rhinorrhea, sneezing and sore throat.   Respiratory: Negative for cough, chest tightness and shortness of breath.   Cardiovascular: Negative for chest pain and palpitations.  Gastrointestinal: Negative for abdominal pain, constipation, diarrhea, nausea and vomiting.  Endocrine: Negative for cold intolerance, heat intolerance, polydipsia and polyuria.  Musculoskeletal: Positive for arthralgias and joint swelling. Negative for back pain and neck pain.  Skin: Negative for rash.  Neurological: Negative for dizziness, tremors, numbness and headaches.  Hematological: Negative for adenopathy. Does not bruise/bleed easily.  Psychiatric/Behavioral: Negative for behavioral problems, sleep disturbance and suicidal ideas. The patient is not nervous/anxious.     Today's Vitals   01/31/19 0842  BP: (!) 138/59  Pulse: 60  Weight: 260 lb (117.9 kg)  Height: 5\' 11"  (1.803 m)   Body mass index is 36.26 kg/m.  Observation/Objective:  The patient is alert and oriented. He is pleasant and answering all questions appropriately. Breathing is non-labored. He is in no acute distress.    Assessment/Plan: 1. Chronic gout of multiple sites, unspecified cause Reviewed labs showing elevated uric acid and sed rate. Joint pain and swelling most likely due to this. Start uloric 40mg  daily. Information provided regarding dietary modifications which help keep uric acid levels low. Will check LFTs prior to next visit.  - Comprehensive metabolic panel; Future  2. Primary generalized hypertrophic osteoarthrosis - ibuprofen (ADVIL) 800 MG tablet; Take  1 tablet (800 mg total) by mouth every 8 (eight) hours as needed for moderate pain.  Dispense: 90 tablet; Refill: 1  3. Essential hypertension Stable. Continue bp medication as prescribed  - CBC with Differential/Platelet; Future - Lipid panel; Future  4. Acquired hypothyroidism Check thyroid panel and adjust levothyroxine as indicated.  - TSH; Future  5. Screening for prostate cancer - PSA; Future    General Counseling: Shion verbalizes understanding of the findings of today's phone visit and agrees with plan of treatment. I have discussed any further diagnostic evaluation that may be needed or ordered today. We also reviewed his medications today. he has been encouraged to call the office with any questions or concerns that should arise related to todays visit.  Hypertension Counseling:   The following hypertensive lifestyle modification were recommended and discussed:  1.  Limiting alcohol intake to less than 1 oz/day of ethanol:(24 oz of beer or 8 oz of wine or 2 oz of 100-proof whiskey). 2. Take baby ASA 81 mg daily. 3. Importance of regular aerobic exercise and losing weight. 4. Reduce dietary saturated fat and cholesterol intake for overall cardiovascular health. 5. Maintaining adequate dietary potassium, calcium, and magnesium intake. 6. Regular monitoring of the blood pressure. 7. Reduce sodium intake to less than 100 mmol/day (less than 2.3 gm of sodium or less than 6 gm of sodium choride)   This patient was seen by [place] with Dr Lavera Guise as a part of collaborative care agreement  Orders Placed This Encounter  Procedures  . PSA  . CBC with Differential/Platelet  . Comprehensive metabolic panel  . Lipid panel  . TSH    Meds ordered this encounter  Medications  . febuxostat (ULORIC) 40 MG tablet    Sig: Take 1 tablet (40 mg total) by mouth daily.    Dispense:  30 tablet    Refill:  3    Order Specific Question:   Supervising Provider     Answer:   Lavera Guise [[id number]]  . ibuprofen (ADVIL) 800 MG tablet    Sig: Take 1 tablet (800 mg total) by mouth every 8 (eight) hours as needed for moderate pain.    Dispense:  90 tablet    Refill:  1    Order Specific Question:   Supervising Provider    Answer:   Lavera Guise [[id number]]    Time spent: 43 Minutes    Dr Lavera Guise Internal medicine

## 2019-01-31 NOTE — Patient Instructions (Signed)
Low-Purine Eating Plan A low-purine eating plan involves making food choices to limit your intake of purine. Purine is a kind of uric acid. Too much uric acid in your blood can cause certain conditions, such as gout and kidney stones. Eating a low-purine diet can help control these conditions. What are tips for following this plan? Reading food labels   Avoid foods with saturated or Trans fat.  Check the ingredient list of grains-based foods, such as bread and cereal, to make sure that they contain whole grains.  Check the ingredient list of sauces or soups to make sure they do not contain meat or fish.  When choosing soft drinks, check the ingredient list to make sure they do not contain high-fructose corn syrup. Shopping  Buy plenty of fresh fruits and vegetables.  Avoid buying canned or fresh fish.  Buy dairy products labeled as low-fat or nonfat.  Avoid buying premade or processed foods. These foods are often high in fat, salt (sodium), and added sugar. Cooking  Use olive oil instead of butter when cooking. Oils like olive oil, canola oil, and sunflower oil contain healthy fats. Meal planning  Learn which foods do or do not affect you. If you find out that a food tends to cause your gout symptoms to flare up, avoid eating that food. You can enjoy foods that do not cause problems. If you have any questions about a food item, talk with your dietitian or health care provider.  Limit foods high in fat, especially saturated fat. Fat makes it harder for your body to get rid of uric acid.  Choose foods that are lower in fat and are lean sources of protein. General guidelines  Limit alcohol intake to no more than 1 drink a day for nonpregnant women and 2 drinks a day for men. One drink equals 12 oz of beer, 5 oz of wine, or 1 oz of hard liquor. Alcohol can affect the way your body gets rid of uric acid.  Drink plenty of water to keep your urine clear or pale yellow. Fluids can help  remove uric acid from your body.  If directed by your health care provider, take a vitamin C supplement.  Work with your health care provider and dietitian to develop a plan to achieve or maintain a healthy weight. Losing weight can help reduce uric acid in your blood. What foods are recommended? The items listed may not be a complete list. Talk with your dietitian about what dietary choices are best for you. Foods low in purines Foods low in purines do not need to be limited. These include:  All fruits.  All low-purine vegetables, pickles, and olives.  Breads, pasta, rice, cornbread, and popcorn. Cake and other baked goods.  All dairy foods.  Eggs, nuts, and nut butters.  Spices and condiments, such as salt, herbs, and vinegar.  Plant oils, butter, and margarine.  Water, sugar-free soft drinks, tea, coffee, and cocoa.  Vegetable-based soups, broths, sauces, and gravies. Foods moderate in purines Foods moderate in purines should be limited to the amounts listed.   cup of asparagus, cauliflower, spinach, mushrooms, or green peas, each day.  2/3 cup uncooked oatmeal, each day.   cup dry wheat bran or wheat germ, each day.  2-3 ounces of meat or poultry, each day.  4-6 ounces of shellfish, such as crab, lobster, oysters, or shrimp, each day.  1 cup cooked beans, peas, or lentils, each day.  Soup, broths, or bouillon made from meat or   fish. Limit these foods as much as possible. What foods are not recommended? The items listed may not be a complete list. Talk with your dietitian about what dietary choices are best for you. Limit your intake of foods high in purines, including:  Beer and other alcohol.  Meat-based gravy or sauce.  Canned or fresh fish, such as: ? Anchovies, sardines, herring, and tuna. ? Mussels and scallops. ? Codfish, trout, and haddock.  Bacon.  Organ meats, such as: ? Liver or kidney. ? Tripe. ? Sweetbreads (thymus gland or pancreas).   Wild game or goose.  Yeast or yeast extract supplements.  Drinks sweetened with high-fructose corn syrup. Summary  Eating a low-purine diet can help control conditions caused by too much uric acid in the body, such as gout or kidney stones.  Choose low-purine foods, limit alcohol, and limit foods high in fat.  You will learn over time which foods do or do not affect you. If you find out that a food tends to cause your gout symptoms to flare up, avoid eating that food. This information is not intended to replace advice given to you by your health care provider. Make sure you discuss any questions you have with your health care provider. Document Released: 11/04/2010 Document Revised: 06/22/2017 Document Reviewed: 08/23/2016 Elsevier Patient Education  2020 [employer] Gout  Gout is painful swelling of your joints. Gout is a type of arthritis. It is caused by having too much uric acid in your body. Uric acid is a chemical that is made when your body breaks down substances called purines. If your body has too much uric acid, sharp crystals can form and build up in your joints. This causes pain and swelling. Gout attacks can happen quickly and be very painful (acute gout). Over time, the attacks can affect more joints and happen more often (chronic gout). What are the causes?  Too much uric acid in your blood. This can happen because: ? Your kidneys do not remove enough uric acid from your blood. ? Your body makes too much uric acid. ? You eat too many foods that are high in purines. These foods include organ meats, some seafood, and beer.  Trauma or stress. What increases the risk?  Having a family history of gout.  Being male and middle-aged.  Being male and having gone through menopause.  Being very overweight (obese).  Drinking alcohol, especially beer.  Not having enough water in the body (being dehydrated).  Losing weight too quickly.  Having an organ transplant.   Having lead poisoning.  Taking certain medicines.  Having kidney disease.  Having a skin condition called psoriasis. What are the signs or symptoms? An attack of acute gout usually happens in just one joint. The most common place is the big toe. Attacks often start at night. Other joints that may be affected include joints of the feet, ankle, knee, fingers, wrist, or elbow. Symptoms of an attack may include:  Very bad pain.  Warmth.  Swelling.  Stiffness.  Shiny, red, or purple skin.  Tenderness. The affected joint may be very painful to touch.  Chills and fever. Chronic gout may cause symptoms more often. More joints may be involved. You may also have white or yellow lumps (tophi) on your hands or feet or in other areas near your joints. How is this treated?  Treatment for this condition has two phases: treating an acute attack and preventing future attacks.  Acute gout treatment may include: ? NSAIDs. ?   Steroids. These are taken by mouth or injected into a joint. ? Colchicine. This medicine relieves pain and swelling. It can be given by mouth or through an IV tube.  Preventive treatment may include: ? Taking small doses of NSAIDs or colchicine daily. ? Using a medicine that reduces uric acid levels in your blood. ? Making changes to your diet. You may need to see a food expert (dietitian) about what to eat and drink to prevent gout. Follow these instructions at home: During a gout attack   If told, put ice on the painful area: ? Put ice in a plastic bag. ? Place a towel between your skin and the bag. ? Leave the ice on for 20 minutes, 2-3 times a day.  Raise (elevate) the painful joint above the level of your heart as often as you can.  Rest the joint as much as possible. If the joint is in your leg, you may be given crutches.  Follow instructions from your doctor about what you cannot eat or drink. Avoiding future gout attacks  Eat a low-purine diet. Avoid  foods and drinks such as: ? Liver. ? Kidney. ? Anchovies. ? Asparagus. ? Herring. ? Mushrooms. ? Mussels. ? Beer.  Stay at a healthy weight. If you want to lose weight, talk with your doctor. Do not lose weight too fast.  Start or continue an exercise plan as told by your doctor. Eating and drinking  Drink enough fluids to keep your pee (urine) pale yellow.  If you drink alcohol: ? Limit how much you use to:  0-1 drink a day for women.  0-2 drinks a day for men. ? Be aware of how much alcohol is in your drink. In the [place] one drink equals one 12 oz bottle of beer (355 mL), one 5 oz glass of wine (148 mL), or one 1 oz glass of hard liquor (44 mL). General instructions  Take over-the-counter and prescription medicines only as told by your doctor.  Do not drive or use heavy machinery while taking prescription pain medicine.  Return to your normal activities as told by your doctor. Ask your doctor what activities are safe for you.  Keep all follow-up visits as told by your doctor. This is important. Contact a doctor if:  You have another gout attack.  You still have symptoms of a gout attack after 10 days of treatment.  You have problems (side effects) because of your medicines.  You have chills or a fever.  You have burning pain when you pee (urinate).  You have pain in your lower back or belly. Get help right away if:  You have very bad pain.  Your pain cannot be controlled.  You cannot pee. Summary  Gout is painful swelling of the joints.  The most common site of pain is the big toe, but it can affect other joints.  Medicines and avoiding some foods can help to prevent and treat gout attacks. This information is not intended to replace advice given to you by your health care provider. Make sure you discuss any questions you have with your health care provider. Document Released: 04/18/2008 Document Revised: 01/30/2018 Document Reviewed: 01/30/2018  Elsevier Patient Education  2020 [employer]  

## 2019-03-14 ENCOUNTER — Ambulatory Visit (INDEPENDENT_AMBULATORY_CARE_PROVIDER_SITE_OTHER): Payer: BC Managed Care – PPO | Admitting: Nurse Practitioner

## 2019-03-14 ENCOUNTER — Other Ambulatory Visit: Payer: Self-pay

## 2019-03-14 ENCOUNTER — Encounter: Payer: Self-pay | Admitting: Nurse Practitioner

## 2019-03-14 VITALS — BP 148/107 | HR 73 | Resp 16 | Ht 71.0 in | Wt 273.2 lb

## 2019-03-14 DIAGNOSIS — R300 Dysuria: Secondary | ICD-10-CM | POA: Insufficient documentation

## 2019-03-14 DIAGNOSIS — I10 Essential (primary) hypertension: Secondary | ICD-10-CM | POA: Diagnosis not present

## 2019-03-14 DIAGNOSIS — Z6837 Body mass index (BMI) 37.0-37.9, adult: Secondary | ICD-10-CM | POA: Insufficient documentation

## 2019-03-14 DIAGNOSIS — Z6838 Body mass index (BMI) 38.0-38.9, adult: Secondary | ICD-10-CM

## 2019-03-14 DIAGNOSIS — E039 Hypothyroidism, unspecified: Secondary | ICD-10-CM | POA: Diagnosis not present

## 2019-03-14 DIAGNOSIS — Z0001 Encounter for general adult medical examination with abnormal findings: Secondary | ICD-10-CM | POA: Diagnosis not present

## 2019-03-14 DIAGNOSIS — M545 Low back pain, unspecified: Secondary | ICD-10-CM

## 2019-03-14 MED ORDER — LEVOTHYROXINE SODIUM 50 MCG PO TABS: 50.0000 ug | ORAL_TABLET | Freq: Every day | ORAL | 2 refills | Status: DC

## 2019-03-14 MED ORDER — PHENDIMETRAZINE TARTRATE ER 105 MG PO CP24: 1.0000 | ORAL_CAPSULE | Freq: Every day | ORAL | 0 refills | Status: DC

## 2019-03-14 NOTE — Patient Instructions (Signed)
Plantar Fasciitis  Plantar fasciitis is a painful foot condition that affects the heel. It occurs when the band of tissue that connects the toes to the heel bone (plantar fascia) becomes irritated. This can happen as the result of exercising too much or doing other repetitive activities (overuse injury). The pain from plantar fasciitis can range from mild irritation to severe pain that makes it difficult to walk or move. The pain is usually worse in the morning after sleeping, or after sitting or lying down for a while. Pain may also be worse after long periods of walking or standing. What are the causes? This condition may be caused by:  Standing for long periods of time.  Wearing shoes that do not have good arch support.  Doing activities that put stress on joints (high-impact activities), including running, aerobics, and ballet.  Being overweight.  An abnormal way of walking (gait).  Tight muscles in the back of your lower leg (calf).  High arches in your feet.  Starting a new athletic activity. What are the signs or symptoms? The main symptom of this condition is heel pain. Pain may:  Be worse with first steps after a time of rest, especially in the morning after sleeping or after you have been sitting or lying down for a while.  Be worse after long periods of standing still.  Decrease after 30-45 minutes of activity, such as gentle walking. How is this diagnosed? This condition may be diagnosed based on your medical history and your symptoms. Your health care provider may ask questions about your activity level. Your health care provider will do a physical exam to check for:  A tender area on the bottom of your foot.  A high arch in your foot.  Pain when you move your foot.  Difficulty moving your foot. You may have imaging tests to confirm the diagnosis, such as:  X-rays.  Ultrasound.  MRI. How is this treated? Treatment for plantar fasciitis depends on how  severe your condition is. Treatment may include:  Rest, ice, applying pressure (compression), and raising the affected foot (elevation). This may be called RICE therapy. Your health care provider may recommend RICE therapy along with over-the-counter pain medicines to manage your pain.  Exercises to stretch your calves and your plantar fascia.  A splint that holds your foot in a stretched, upward position while you sleep (night splint).  Physical therapy to relieve symptoms and prevent problems in the future.  Injections of steroid medicine (cortisone) to relieve pain and inflammation.  Stimulating your plantar fascia with electrical impulses (extracorporeal shock wave therapy). This is usually the last treatment option before surgery.  Surgery, if other treatments have not worked after 12 months. Follow these instructions at home:  Managing pain, stiffness, and swelling  If directed, put ice on the painful area: ? Put ice in a plastic bag, or use a frozen bottle of water. ? Place a towel between your skin and the bag or bottle. ? Roll the bottom of your foot over the bag or bottle. ? Do this for 20 minutes, 2-3 times a day.  Wear athletic shoes that have air-sole or gel-sole cushions, or try wearing soft shoe inserts that are designed for plantar fasciitis.  Raise (elevate) your foot above the level of your heart while you are sitting or lying down. Activity  Avoid activities that cause pain. Ask your health care provider what activities are safe for you.  Do physical therapy exercises and stretches as told   by your health care provider.  Try activities and forms of exercise that are easier on your joints (low-impact). Examples include swimming, water aerobics, and biking. General instructions  Take over-the-counter and prescription medicines only as told by your health care provider.  Wear a night splint while sleeping, if told by your health care provider. Loosen the splint  if your toes tingle, become numb, or turn cold and blue.  Maintain a healthy weight, or work with your health care provider to lose weight as needed.  Keep all follow-up visits as told by your health care provider. This is important. Contact a health care provider if you:  Have symptoms that do not go away after caring for yourself at home.  Have pain that gets worse.  Have pain that affects your ability to move or do your daily activities. Summary  Plantar fasciitis is a painful foot condition that affects the heel. It occurs when the band of tissue that connects the toes to the heel bone (plantar fascia) becomes irritated.  The main symptom of this condition is heel pain that may be worse after exercising too much or standing still for a long time.  Treatment varies, but it usually starts with rest, ice, compression, and elevation (RICE therapy) and over-the-counter medicines to manage pain. This information is not intended to replace advice given to you by your health care provider. Make sure you discuss any questions you have with your health care provider. Document Released: 04/04/2001 Document Revised: 06/22/2017 Document Reviewed: 05/07/2017 Else[employer] Patient Education  2020 [employer]  

## 2019-03-14 NOTE — Progress Notes (Signed)
[facility] [place], [place] [address]  Internal MEDICINE  Office Visit Note  Patient Name: Ronald Mata  [id number]  [id number]  Date of Service: 03/14/2019    Pt is here for routine health maintenance examination  Chief Complaint  Patient presents with  . Annual Exam  . Hypertension  . Hypothyroidism  . Medication Management    does pt still need to take his levothyroxine?  . Weight Management     The patient is here for health maintenance exam. He has chronic issues with his feet, recurring plantar fasciitis. Does see orthopedic provider. Currently wearing good, supportive sneakers, which do help. 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 He does take ibuprofen as needed for pain/inflammation. He also has gout in multiple joints. Was started on uloric which he started about a week ago. States that he is currently doing well. He is due to have routine, fasting labs done. They were ordered after his last visit. Will get these done soon. E does have right ear pain. Also has some sinus drainage. Causes some vertigo. Hearing is pretty clear. Does not feel like ear infection at this time. Has seen ENT for this. Was given a drop for this and just started using this again.    Current Medication: Outpatient Encounter Medications as of 03/14/2019  Medication Sig  . cetirizine (ZYRTEC) 10 MG tablet Take 1 tablet (10 mg total) by mouth daily.  . febuxostat (ULORIC) 40 MG tablet Take 1 tablet (40 mg total) by mouth daily.  . hydrochlorothiazide (HYDRODIURIL) 25 MG tablet Take 1 tablet (25 mg total) by mouth daily.  Marland Kitchen ibuprofen (ADVIL) 800 MG tablet Take 1 tablet (800 mg total) by mouth every 8 (eight) hours as needed for moderate pain.  Marland Kitchen levothyroxine (SYNTHROID) 50 MCG tablet Take 1 tablet (50 mcg total) by mouth daily before breakfast.  . losartan (COZAAR) 100 MG tablet Take 1/2 tab po daily  .  [DISCONTINUED] levothyroxine (SYNTHROID, LEVOTHROID) 50 MCG tablet Take 1 tablet (50 mcg total) by mouth daily before breakfast.  . Phendimetrazine Tartrate 105 MG CP24 Take 1 capsule (105 mg total) by mouth daily.  . [DISCONTINUED] HYDROcodone-acetaminophen (NORCO/VICODIN) 5-325 MG tablet Take 1 tablet by mouth 3 (three) times daily as needed for moderate pain. (Patient not taking: Reported on 01/31/2019)   No facility-administered encounter medications on file as of 03/14/2019.     Surgical History: Past Surgical History:  Procedure Laterality Date  . stomach wrap and achalasia      Medical History: Past Medical History:  Diagnosis Date  . Hypertension   . Thyroid disease     Family History: Family History  Problem Relation Age of Onset  . Diabetes Mother   . Hypertension Mother   . Hyperlipidemia Mother   . Stroke Mother   . Osteoarthritis Mother   . Diabetes Father   . Osteoarthritis Father   . Glaucoma Father       Review of Systems  Constitutional: Negative for chills, fatigue and unexpected weight change.       Weight gain of 5 pounds since last visit.   HENT: Negative for congestion, rhinorrhea, sneezing and sore throat.   Respiratory: Negative for cough, chest tightness and shortness of breath.   Cardiovascular: Negative for chest pain and palpitations.       Elevated blood pressure today.   Gastrointestinal: Negative for abdominal pain, constipation, diarrhea, nausea and vomiting.  Intermittent indigestion.   Endocrine: Negative for cold intolerance, heat intolerance, polydipsia and polyuria.  Musculoskeletal: Positive for arthralgias and joint swelling. Negative for back pain and neck pain.       Joint pain in feet and heels, lower back, and left elbow.   Skin: Negative for rash.  Neurological: Positive for headaches. Negative for dizziness, tremors and numbness.  Hematological: Negative for adenopathy. Does not bruise/bleed easily.   Psychiatric/Behavioral: Negative for behavioral problems, sleep disturbance and suicidal ideas. The patient is not nervous/anxious.      Today's Vitals   03/14/19 0947  BP: (!) 148/107  Pulse: 73  Resp: 16  SpO2: 97%  Weight: 273 lb 3.2 oz (123.9 kg)  Height: 5' 11"  (1.803 m)   Body mass index is 38.1 kg/m.  Physical Exam Vitals signs and nursing note reviewed.  Constitutional:      General: He is not in acute distress.    Appearance: Normal appearance. He is well-developed. He is obese. He is not diaphoretic.  HENT:     Head: Normocephalic and atraumatic.     Right Ear: Tympanic membrane normal.     Left Ear: Tympanic membrane normal.     Ears:     Comments: Ear wax present in bilateral externa lear canals.     Nose: Nose normal.     Mouth/Throat:     Pharynx: No oropharyngeal exudate.  Eyes:     Conjunctiva/sclera: Conjunctivae normal.     Pupils: Pupils are equal, round, and reactive to light.  Neck:     Musculoskeletal: Normal range of motion and neck supple.     Thyroid: No thyromegaly.     Vascular: No JVD.     Trachea: No tracheal deviation.  Cardiovascular:     Rate and Rhythm: Normal rate and regular rhythm.     Pulses: Normal pulses.     Heart sounds: Normal heart sounds. No murmur. No friction rub. No gallop.   Pulmonary:     Effort: Pulmonary effort is normal. No respiratory distress.     Breath sounds: Normal breath sounds. No wheezing or rales.  Chest:     Chest wall: No tenderness.  Abdominal:     General: Bowel sounds are normal.     Palpations: Abdomen is soft.     Tenderness: There is no abdominal tenderness.  Musculoskeletal:     Lumbar back: He exhibits decreased range of motion.     Comments: Back pain is mild to moderate now. Still worse with bending and twisting at the waist or lifting more than 10 pounds. No visible abnormalities or deformities are noted.   Lymphadenopathy:     Cervical: No cervical adenopathy.  Skin:    General: Skin  is warm and dry.  Neurological:     Mental Status: He is alert and oriented to person, place, and time.     Cranial Nerves: No cranial nerve deficit.  Psychiatric:        Behavior: Behavior normal.        Thought Content: Thought content normal.        Judgment: Judgment normal.      LABS: Recent Results (from the past 2160 hour(s))  Novel Coronavirus, NAA ([facility])     Status: None   Collection Time: 01/23/19 11:08 AM  Result Value Ref Range   SARS-CoV-2, NAA Not Detected Not Detected    Comment: This test was developed and its performance characteristics determined by [employer]. This test has not been  FDA cleared or approved. This test has been authorized by FDA under an Emergency Use Authorization (EUA). This test is only authorized for the duration of time the declaration that circumstances exist justifying the authorization of the emergency use of in vitro diagnostic tests for detection of SARS-CoV-2 virus and/or diagnosis of COVID-19 infection under section 564(b)(1) of the Act, 21 U.S.C. 076AUQ-3(F)(3), unless the authorization is terminated or revoked sooner. When diagnostic testing is negative, the possibility of a false negative result should be considered in the context of a patient's recent exposures and the presence of clinical signs and symptoms consistent with COVID-19. An individual without symptoms of COVID-19 and who is not shedding SARS-CoV-2 virus would expect to have a negative (not detected) result in this assay.   Sed Rate (ESR)     Status: Abnormal   Collection Time: 01/27/19  1:28 PM  Result Value Ref Range   Sed Rate 43 (H) 0 - 15 mm/hr  Uric acid     Status: Abnormal   Collection Time: 01/27/19  1:28 PM  Result Value Ref Range   Uric Acid 9.3 (H) 3.7 - 8.6 mg/dL    Comment:            Therapeutic target for gout patients: <6.0  ANA Direct w/Reflex if Positive     Status: None   Collection Time: 01/27/19  1:29 PM  Result Value Ref  Range   Anti Nuclear Antibody (ANA) Negative Negative  Rheumatoid factor     Status: None   Collection Time: 01/27/19  1:29 PM  Result Value Ref Range   Rhuematoid fact SerPl-aCnc <10.0 0.0 - 13.9 IU/mL  SARS-CoV-2 Antibody, IgM     Status: None   Collection Time: 01/27/19  1:30 PM  Result Value Ref Range   SARS-CoV-2 Antibody, IgM Negative Negative    Comment: This sample does not contain detectable SARS-CoV-2 IgM antibodies. This negative result does not rule out SARS-CoV-2 infection. Correlation with epidemiologic risk factors and other clinical and laboratory findings is recommended. Serologic results should not be used as the sole basis to diagnose or exclude recent SARS-CoV-2 infection.     Assessment/Plan:  1. Encounter for general adult medical examination with abnormal findings Annual health maintenance exam today.   2. Acquired hypothyroidism Patient to check thyroid panel and adjust levothyroxine dose as indicated  - levothyroxine (SYNTHROID) 50 MCG tablet; Take 1 tablet (50 mcg total) by mouth daily before breakfast.  Dispense: 90 tablet; Refill: 2  3. Essential hypertension Improved. Continue bp medication as prescribed.   4. BMI 38.0-38.9,adult Start bontril daily. Limit calorie intake to 1500 calories per day and incorporate exercise ito daily routine.  - Phendimetrazine Tartrate 105 MG CP24; Take 1 capsule (105 mg total) by mouth daily.  Dispense: 30 capsule; Refill: 0  5. Low back pain at multiple sites Ibuprofen may be taken as needed and as prescribed. Orthopedics visits as scheduled.   6. Dysuria - Urinalysis, Routine w reflex microscopic  General Counseling: Kerrick verbalizes understanding of the findings of todays visit and agrees with plan of treatment. I have discussed any further diagnostic evaluation that may be needed or ordered today. We also reviewed his medications today. he has been encouraged to call the office with any questions or concerns that  should arise related to todays visit.    Counseling:  Hypertension Counseling:   The following hypertensive lifestyle modification were recommended and discussed:  1. Limiting alcohol intake to less than 1 oz/day of ethanol:(24  oz of beer or 8 oz of wine or 2 oz of 100-proof whiskey). 2. Take baby ASA 81 mg daily. 3. Importance of regular aerobic exercise and losing weight. 4. Reduce dietary saturated fat and cholesterol intake for overall cardiovascular health. 5. Maintaining adequate dietary potassium, calcium, and magnesium intake. 6. Regular monitoring of the blood pressure. 7. Reduce sodium intake to less than 100 mmol/day (less than 2.3 gm of sodium or less than 6 gm of sodium choride)   This patient was seen by [place] with Dr Lavera Guise as a part of collaborative care agreement  Orders Placed This Encounter  Procedures  . Urinalysis, Routine w reflex microscopic    Meds ordered this encounter  Medications  . Phendimetrazine Tartrate 105 MG CP24    Sig: Take 1 capsule (105 mg total) by mouth daily.    Dispense:  30 capsule    Refill:  0    Order Specific Question:   Supervising Provider    Answer:   Lavera Guise [[id number]]  . levothyroxine (SYNTHROID) 50 MCG tablet    Sig: Take 1 tablet (50 mcg total) by mouth daily before breakfast.    Dispense:  90 tablet    Refill:  2    Order Specific Question:   Supervising Provider    Answer:   Lavera Guise [[id number]]    Time spent: [place], MD  Internal Medicine

## 2019-03-15 LAB — URINALYSIS, ROUTINE W REFLEX MICROSCOPIC
Bilirubin, UA: NEGATIVE
Glucose, UA: NEGATIVE
Leukocytes,UA: NEGATIVE
Nitrite, UA: NEGATIVE
RBC, UA: NEGATIVE
Specific Gravity, UA: 1.024 (ref 1.005–1.030)
Urobilinogen, Ur: 0.2 mg/dL (ref 0.2–1.0)
pH, UA: 5 (ref 5.0–7.5)

## 2019-04-04 ENCOUNTER — Other Ambulatory Visit: Payer: Self-pay

## 2019-04-04 DIAGNOSIS — M1A09X0 Idiopathic chronic gout, multiple sites, without tophus (tophi): Secondary | ICD-10-CM

## 2019-04-04 DIAGNOSIS — Z125 Encounter for screening for malignant neoplasm of prostate: Secondary | ICD-10-CM

## 2019-04-04 DIAGNOSIS — E039 Hypothyroidism, unspecified: Secondary | ICD-10-CM

## 2019-04-04 DIAGNOSIS — I10 Essential (primary) hypertension: Secondary | ICD-10-CM

## 2019-04-14 ENCOUNTER — Other Ambulatory Visit: Payer: Self-pay

## 2019-04-14 ENCOUNTER — Ambulatory Visit: Payer: BC Managed Care – PPO | Admitting: Nurse Practitioner

## 2019-04-14 ENCOUNTER — Encounter: Payer: Self-pay | Admitting: Nurse Practitioner

## 2019-04-14 VITALS — BP 133/97 | HR 77 | Temp 97.3°F | Resp 16 | Ht 71.0 in | Wt 271.6 lb

## 2019-04-14 DIAGNOSIS — I10 Essential (primary) hypertension: Secondary | ICD-10-CM | POA: Diagnosis not present

## 2019-04-14 DIAGNOSIS — Z6837 Body mass index (BMI) 37.0-37.9, adult: Secondary | ICD-10-CM | POA: Diagnosis not present

## 2019-04-14 DIAGNOSIS — M25561 Pain in right knee: Secondary | ICD-10-CM | POA: Diagnosis not present

## 2019-04-14 DIAGNOSIS — E039 Hypothyroidism, unspecified: Secondary | ICD-10-CM

## 2019-04-14 MED ORDER — PHENDIMETRAZINE TARTRATE ER 105 MG PO CP24: 1.0000 | ORAL_CAPSULE | Freq: Every day | ORAL | 0 refills | Status: DC

## 2019-04-14 MED ORDER — PREDNISONE 10 MG (21) PO TBPK: ORAL_TABLET | ORAL | 0 refills | Status: DC

## 2019-04-14 NOTE — Progress Notes (Signed)
[facility] [place], [place] [address]  Internal MEDICINE  Office Visit Note  Patient Name: Ronald Mata  [id number]  [id number]  Date of Service: 04/14/2019  Chief Complaint  Patient presents with  . Follow-up    weight management, review labs, pt was unable to get labs done  . Hypertension  . Hypothyroidism  . Knee Pain    left knee pain, popped knee last tuesday and has an old injury on this knee, pain happens every year, has been taking ibuprofen, but it is still giving him pain    The patient is here for follow up of weight management. Restarted on at his last visit. He has been limiting calorie intake to 1500 calories per day and trying to exercise more frequently. He has lost two pounds and reports that he has been doing well with this medication. Blood pressure is doing well.  He is having pain in right knee. Has been giving him trouble since last Tuesday. Will get stiff then he feels a "pop." gets very swollen across the top of his patella. He states that he has an old injury from high school when he blew out his right knee. Went through rehab and recovered. This just started bothering him again this week. He states that he has never seen orthopedic provider for issues with the right knee. Has been taking ibuprofen for the knee. Icing it and using crutches at home as needed. Gradually the pain and swelling has improved.       Current Medication: Outpatient Encounter Medications as of 04/14/2019  Medication Sig  . cetirizine (ZYRTEC) 10 MG tablet Take 1 tablet (10 mg total) by mouth daily.  . febuxostat (ULORIC) 40 MG tablet Take 1 tablet (40 mg total) by mouth daily.  . hydrochlorothiazide (HYDRODIURIL) 25 MG tablet Take 1 tablet (25 mg total) by mouth daily.  Marland Kitchen ibuprofen (ADVIL) 800 MG tablet Take 1 tablet (800 mg total) by mouth every 8 (eight) hours as needed for moderate pain.  Marland Kitchen levothyroxine (SYNTHROID) 50 MCG tablet Take 1 tablet (50 mcg  total) by mouth daily before breakfast.  . losartan (COZAAR) 100 MG tablet Take 1/2 tab po daily  . Phendimetrazine Tartrate 105 MG CP24 Take 1 capsule (105 mg total) by mouth daily.  . predniSONE (STERAPRED UNI-PAK 21 TAB) 10 MG (21) TBPK tablet 6 day taper - take by mouth as directed for 6 days  . [DISCONTINUED] Phendimetrazine Tartrate 105 MG CP24 Take 1 capsule (105 mg total) by mouth daily.   No facility-administered encounter medications on file as of 04/14/2019.     Surgical History: Past Surgical History:  Procedure Laterality Date  . stomach wrap and achalasia      Medical History: Past Medical History:  Diagnosis Date  . Hypertension   . Thyroid disease     Family History: Family History  Problem Relation Age of Onset  . Diabetes Mother   . Hypertension Mother   . Hyperlipidemia Mother   . Stroke Mother   . Osteoarthritis Mother   . Diabetes Father   . Osteoarthritis Father   . Glaucoma Father     Social History   Socioeconomic History  . Marital status: Married    Spouse name: Not on file  . Number of children: Not on file  . Years of education: Not on file  . Highest education level: Not on file  Occupational History  . Not on file  Social Needs  . Financial  resource strain: Not on file  . Food insecurity    Worry: Not on file    Inability: Not on file  . Transportation needs    Medical: Not on file    Non-medical: Not on file  Tobacco Use  . Smoking status: Never Smoker  . Smokeless tobacco: Never Used  Substance and Sexual Activity  . Alcohol use: No    Alcohol/week: 0.0 standard drinks  . Drug use: No  . Sexual activity: Not on file  Lifestyle  . Physical activity    Days per week: Not on file    Minutes per session: Not on file  . Stress: Not on file  Relationships  . Social Herbalist on phone: Not on file    Gets together: Not on file    Attends religious service: Not on file    Active member of club or organization:  Not on file    Attends meetings of clubs or organizations: Not on file    Relationship status: Not on file  . Intimate partner violence    Fear of current or ex partner: Not on file    Emotionally abused: Not on file    Physically abused: Not on file    Forced sexual activity: Not on file  Other Topics Concern  . Not on file  Social History Narrative  . Not on file      Review of Systems  Constitutional: Negative for chills, fatigue and unexpected weight change.       Weight loss of 2 pounds since most recent visit .  HENT: Negative for congestion, rhinorrhea, sneezing and sore throat.   Respiratory: Negative for cough, chest tightness and shortness of breath.   Cardiovascular: Negative for chest pain and palpitations.  Gastrointestinal: Negative for abdominal pain, constipation, diarrhea, nausea and vomiting.       Intermittent indigestion.   Endocrine: Negative for cold intolerance, heat intolerance, polydipsia and polyuria.  Musculoskeletal: Positive for arthralgias and joint swelling. Negative for back pain and neck pain.       Right knee pain. Intermittent. Gets stiff, then feels "pop." gets very swollen and makes it hard to walk.   Skin: Negative for rash.  Neurological: Positive for headaches. Negative for dizziness, tremors and numbness.  Hematological: Negative for adenopathy. Does not bruise/bleed easily.  Psychiatric/Behavioral: Negative for behavioral problems, sleep disturbance and suicidal ideas. The patient is not nervous/anxious.     Today's Vitals   04/14/19 0854 04/14/19 0902  BP: (!) 145/102 (!) 133/97  Pulse: 75 77  Resp: 16   Temp: (!) 97.3 F (36.3 C)   SpO2: 97% 98%  Weight: 271 lb 9.6 oz (123.2 kg)   Height: 5\' 11"  (1.803 m)    Body mass index is 37.88 kg/m.  Physical Exam Vitals signs and nursing note reviewed.  Constitutional:      General: He is not in acute distress.    Appearance: Normal appearance. He is well-developed. He is not  diaphoretic.  HENT:     Head: Normocephalic and atraumatic.     Mouth/Throat:     Pharynx: No oropharyngeal exudate.  Eyes:     Pupils: Pupils are equal, round, and reactive to light.  Neck:     Musculoskeletal: Normal range of motion and neck supple.     Thyroid: No thyromegaly.     Vascular: No JVD.     Trachea: No tracheal deviation.  Cardiovascular:     Rate and Rhythm: Normal  rate and regular rhythm.     Heart sounds: Normal heart sounds. No murmur. No friction rub. No gallop.   Pulmonary:     Effort: Pulmonary effort is normal. No respiratory distress.     Breath sounds: Normal breath sounds. No wheezing or rales.  Chest:     Chest wall: No tenderness.  Abdominal:     Palpations: Abdomen is soft.  Musculoskeletal: Normal range of motion.        General: Swelling and tenderness present.     Left lower leg: No edema.     Comments: Right knee is tender and swollen. More severe around the lateral and superior aspects of the patella. Increased tenderness with palpation. Patient is wearing a support sleeve on the knee. He is walking with moderate limp favoring the right side.   Lymphadenopathy:     Cervical: No cervical adenopathy.  Skin:    General: Skin is warm and dry.  Neurological:     Mental Status: He is alert and oriented to person, place, and time.     Cranial Nerves: No cranial nerve deficit.  Psychiatric:        Behavior: Behavior normal.        Thought Content: Thought content normal.        Judgment: Judgment normal.    Assessment/Plan: 1. Acute pain of right knee Start prednisone taper. Take as directed for 6 days. Continue to Apply a compressive ACE bandage. Rest and elevate the affected painful area.  Apply cold compresses intermittently as needed.  As pain recedes, begin normal activities slowly as tolerated.  Call if symptoms persist. Refer to orthopedics for further evaluation and treatment.  - Ambulatory referral to Orthopedic Surgery - predniSONE  (STERAPRED UNI-PAK 21 TAB) 10 MG (21) TBPK tablet; 6 day taper - take by mouth as directed for 6 days  Dispense: 21 tablet; Refill: 0  2. Essential hypertension Stable. Continue bp medication as prescribed   3. Acquired hypothyroidism Improved now back on thyroid medication.   4. Body mass index (bmi) 37.0-37.9, adult Two pound weight loss since most recent visit.may continue bontril SR daily after completion of prednisone taper. Limit calorei intake to 1500 calories per day. Incorporate exercise into daily routine.  - Phendimetrazine Tartrate 105 MG CP24; Take 1 capsule (105 mg total) by mouth daily.  Dispense: 30 capsule; Refill: 0  General Counseling: Hafiz verbalizes understanding of the findings of todays visit and agrees with plan of treatment. I have discussed any further diagnostic evaluation that may be needed or ordered today. We also reviewed his medications today. he has been encouraged to call the office with any questions or concerns that should arise related to todays visit.   There is a liability release in patients' chart. There has been a 10 minute discussion about the side effects including but not limited to elevated blood pressure, anxiety, lack of sleep and dry mouth. Pt understands and will like to start/continue on appetite suppressant at this time. There will be one month RX given at the time of visit with proper follow up. Nova diet plan with restricted calories is given to the pt. Pt understands and agrees with  plan of treatment  This patient was seen by Leretha Pol FNP Collaboration with Dr Lavera Guise as a part of collaborative care agreement  Orders Placed This Encounter  Procedures  . Ambulatory referral to Orthopedic Surgery    Meds ordered this encounter  Medications  . predniSONE (STERAPRED UNI-PAK 21  TAB) 10 MG (21) TBPK tablet    Sig: 6 day taper - take by mouth as directed for 6 days    Dispense:  21 tablet    Refill:  0    Order Specific  Question:   Supervising Provider    Answer:   Lavera Guise [place]  . Phendimetrazine Tartrate 105 MG CP24    Sig: Take 1 capsule (105 mg total) by mouth daily.    Dispense:  30 capsule    Refill:  0    Order Specific Question:   Supervising Provider    Answer:   Lavera Guise [id number]    Time spent: 87 Minutes      Dr Lavera Guise Internal medicine

## 2019-04-15 ENCOUNTER — Other Ambulatory Visit: Payer: Self-pay

## 2019-04-15 DIAGNOSIS — M150 Primary generalized (osteo)arthritis: Secondary | ICD-10-CM

## 2019-04-15 MED ORDER — IBUPROFEN 800 MG PO TABS: 800.0000 mg | ORAL_TABLET | Freq: Three times a day (TID) | ORAL | 1 refills | Status: DC | PRN

## 2019-04-24 IMAGING — CR DG LUMBAR SPINE COMPLETE 4+V
1 series · 5 of 5 positions shown · non-contrast
Comparison: January 11, 2016

CLINICAL DATA: Lumbago

EXAM:
LUMBAR SPINE - COMPLETE 4+ VIEW

[Series 1: dg lumbar spine complete 4 +v · 0.14mm/px · 5 of 5 slices shown]
[im 1/5]
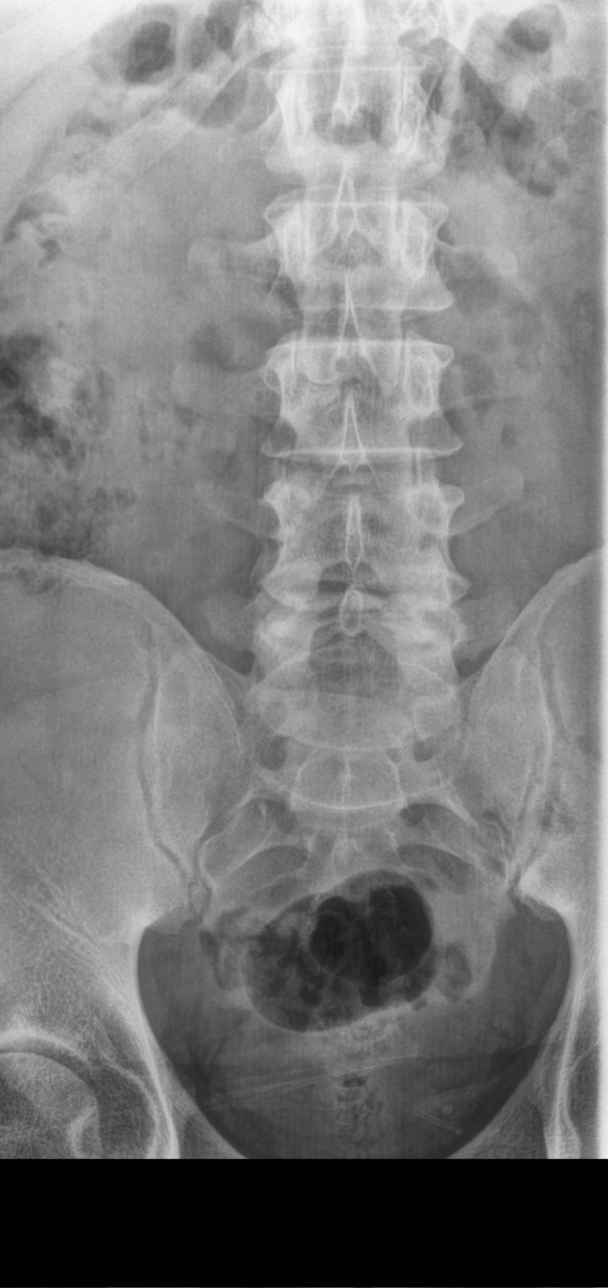
[im 2/5]
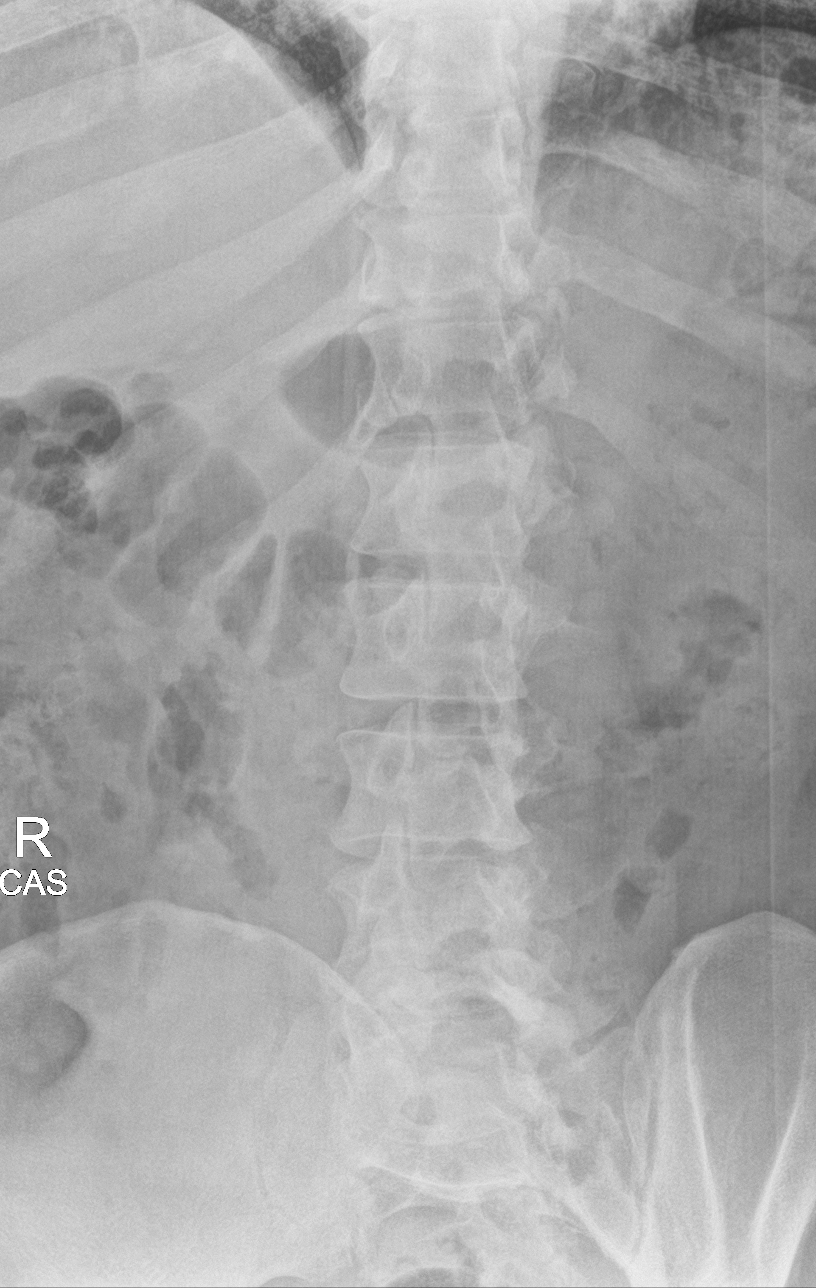
[im 3/5]
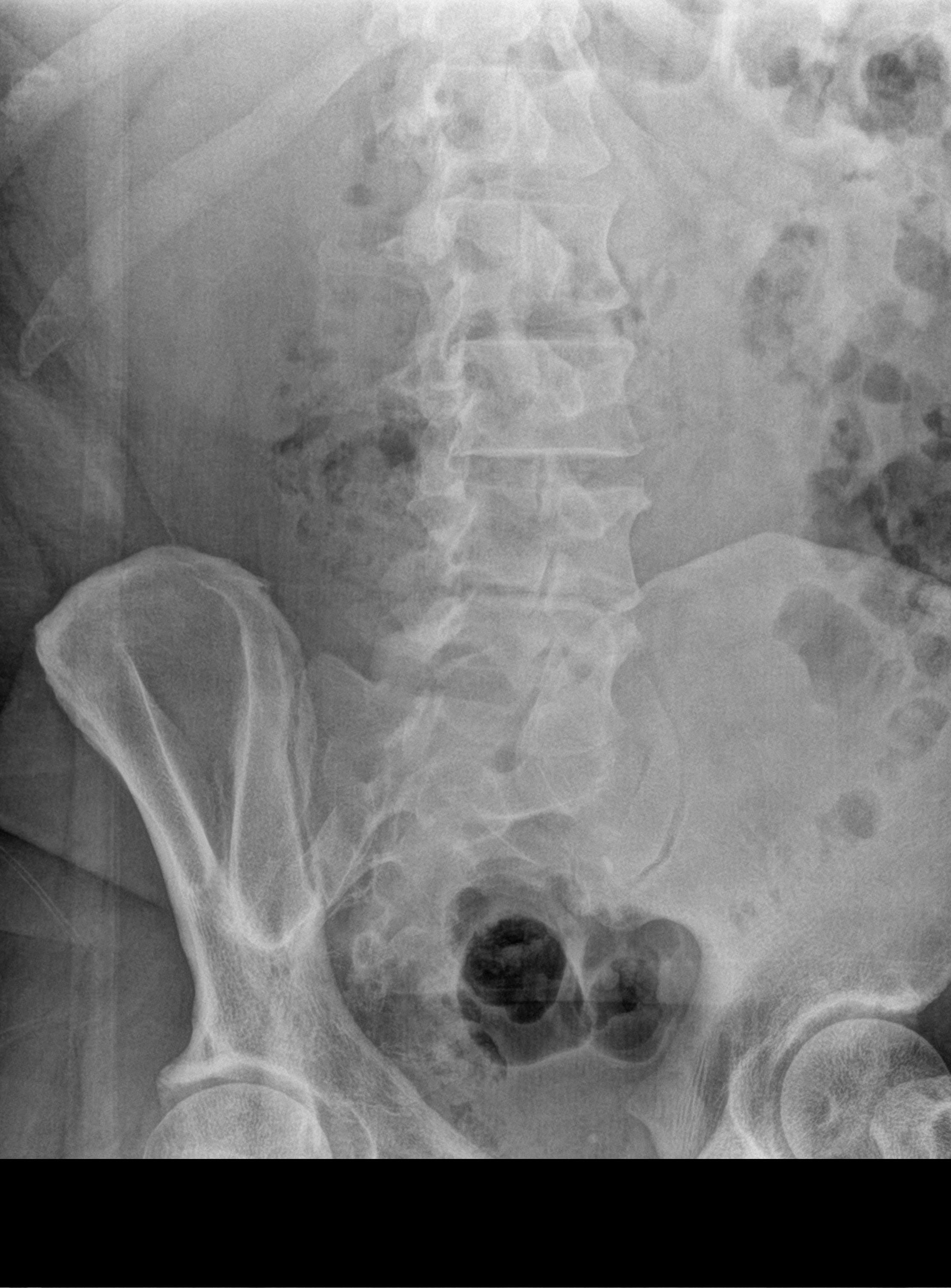
[im 4/5]
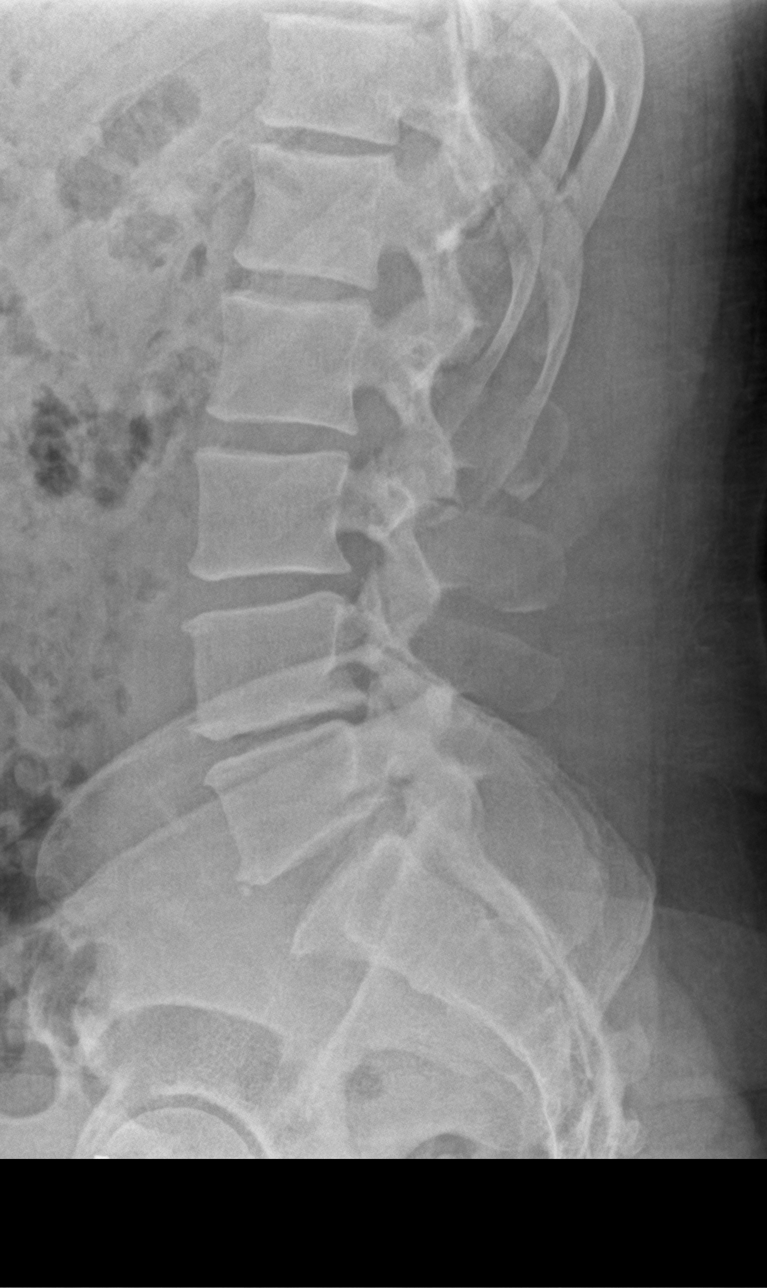
[im 5/5]
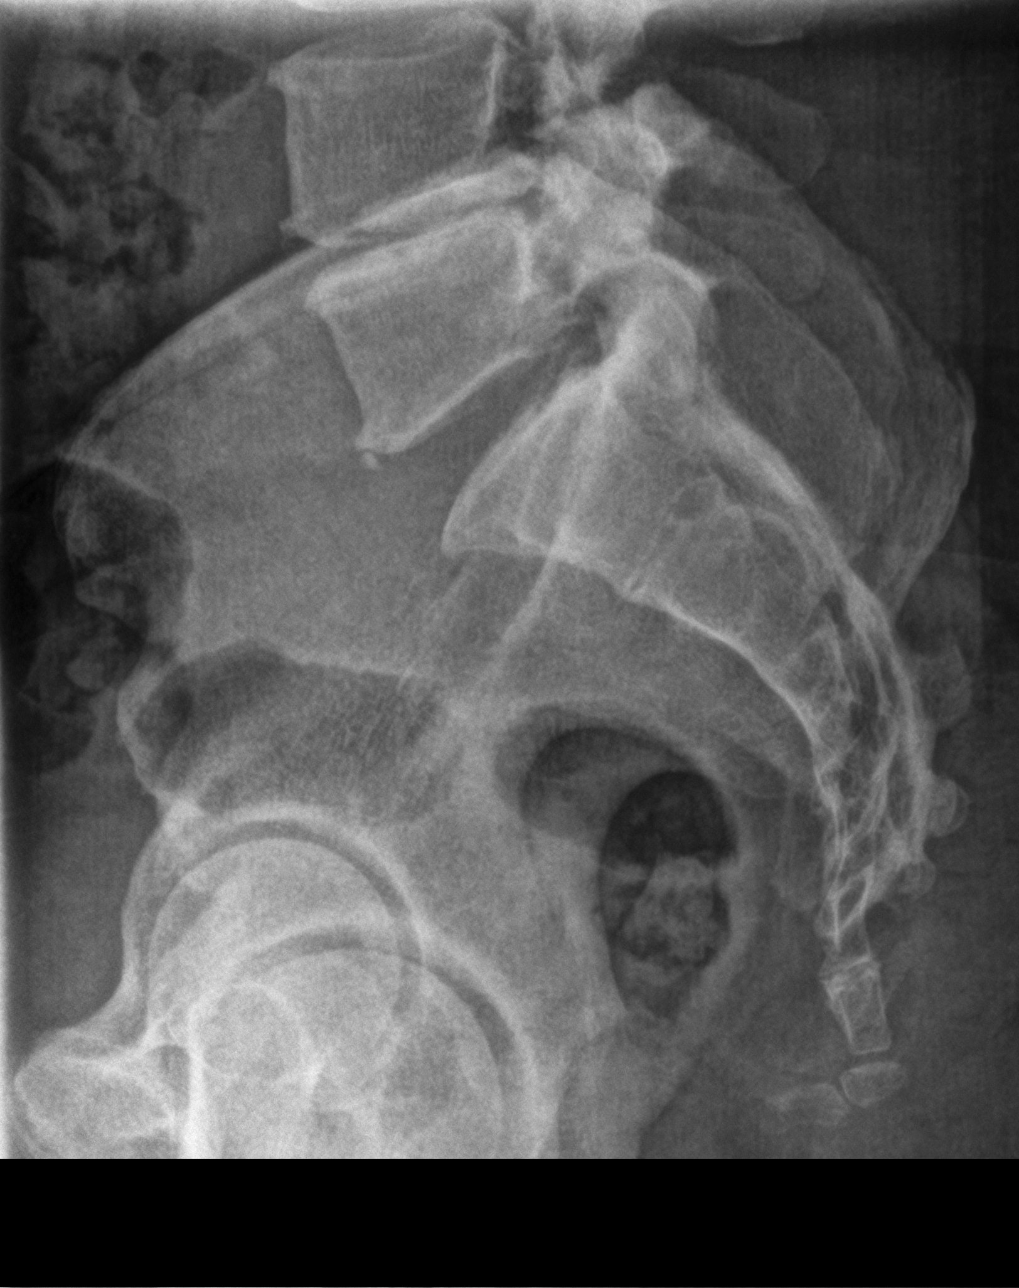

[5 of 5 positions shown; findings below may reference images not displayed]

FINDINGS: Frontal, lateral, spot lumbosacral lateral, and bilateral oblique
views were obtained. There are 5 non-rib-bearing lumbar type
vertebral bodies. There is no fracture or spondylolisthesis. There
is severe disc space narrowing at L4-5, also present on prior study.
Other disc spaces appear unremarkable. There are anterior
osteophytes at L4 and L5, stable. There is facet osteoarthritic
change at L5-S1 bilaterally. No erosive changes.
IMPRESSION: Severe disc space narrowing at L4-5, stable. Facet osteoarthritic
change noted at L5-S1. No fracture or spondylolisthesis.

## 2019-04-28 DIAGNOSIS — M25461 Effusion, right knee: Secondary | ICD-10-CM | POA: Diagnosis not present

## 2019-04-28 DIAGNOSIS — M25561 Pain in right knee: Secondary | ICD-10-CM | POA: Diagnosis not present

## 2019-04-28 DIAGNOSIS — G8929 Other chronic pain: Secondary | ICD-10-CM | POA: Diagnosis not present

## 2019-04-28 DIAGNOSIS — M1711 Unilateral primary osteoarthritis, right knee: Secondary | ICD-10-CM | POA: Diagnosis not present

## 2019-05-07 ENCOUNTER — Other Ambulatory Visit: Payer: Self-pay | Admitting: Nurse Practitioner

## 2019-05-07 ENCOUNTER — Telehealth: Payer: Self-pay

## 2019-05-07 DIAGNOSIS — M25561 Pain in right knee: Secondary | ICD-10-CM

## 2019-05-07 MED ORDER — PREDNISONE 10 MG (21) PO TBPK: ORAL_TABLET | ORAL | 0 refills | Status: DC

## 2019-05-07 NOTE — Progress Notes (Signed)
Renew prednisone taper. Take as directed for 6 days. He needs to see orthopedics for this if it continues. Thanks.

## 2019-05-07 NOTE — Telephone Encounter (Signed)
Renew prednisone taper. Take as directed for 6 days. He needs to see orthopedics for this if it continues. Thanks.

## 2019-05-07 NOTE — Telephone Encounter (Signed)
Pt advised that we renew prednisone with he have continue flare up need to see by orthopedic

## 2019-05-14 DIAGNOSIS — M1A09X0 Idiopathic chronic gout, multiple sites, without tophus (tophi): Secondary | ICD-10-CM | POA: Diagnosis not present

## 2019-05-15 LAB — CBC WITH DIFFERENTIAL/PLATELET
Basophils Absolute: 0.1 10*3/uL (ref 0.0–0.2)
Basos: 1 %
EOS (ABSOLUTE): 0 10*3/uL (ref 0.0–0.4)
Eos: 1 %
Hematocrit: 41.6 % (ref 37.5–51.0)
Hemoglobin: 13.9 g/dL (ref 13.0–17.7)
Immature Grans (Abs): 0.2 10*3/uL — ABNORMAL HIGH (ref 0.0–0.1)
Immature Granulocytes: 3 %
Lymphocytes Absolute: 2.1 10*3/uL (ref 0.7–3.1)
Lymphs: 31 %
MCH: 26.6 pg (ref 26.6–33.0)
MCHC: 33.4 g/dL (ref 31.5–35.7)
MCV: 80 fL (ref 79–97)
Monocytes Absolute: 0.5 10*3/uL (ref 0.1–0.9)
Monocytes: 7 %
Neutrophils Absolute: 3.9 10*3/uL (ref 1.4–7.0)
Neutrophils: 57 %
Platelets: 318 10*3/uL (ref 150–450)
RBC: 5.23 x10E6/uL (ref 4.14–5.80)
RDW: 13.6 % (ref 11.6–15.4)
WBC: 6.8 10*3/uL (ref 3.4–10.8)

## 2019-05-15 LAB — PSA: Prostate Specific Ag, Serum: 0.7 ng/mL (ref 0.0–4.0)

## 2019-05-15 LAB — COMPREHENSIVE METABOLIC PANEL
ALT: 37 IU/L (ref 0–44)
AST: 22 IU/L (ref 0–40)
Albumin/Globulin Ratio: 1.8 (ref 1.2–2.2)
Albumin: 4.3 g/dL (ref 4.0–5.0)
Alkaline Phosphatase: 91 IU/L (ref 39–117)
BUN/Creatinine Ratio: 13 (ref 9–20)
BUN: 15 mg/dL (ref 6–24)
Bilirubin Total: <0.2 mg/dL (ref 0.0–1.2)
CO2: 24 mmol/L (ref 20–29)
Calcium: 9.7 mg/dL (ref 8.7–10.2)
Chloride: 97 mmol/L (ref 96–106)
Creatinine, Ser: 1.17 mg/dL (ref 0.76–1.27)
GFR calc Af Amer: 85 mL/min/{1.73_m2} (ref >59)
GFR calc non Af Amer: 74 mL/min/{1.73_m2} (ref >59)
Globulin, Total: 2.4 g/dL (ref 1.5–4.5)
Glucose: 309 mg/dL — ABNORMAL HIGH (ref 65–99)
Potassium: 4.3 mmol/L (ref 3.5–5.2)
Sodium: 137 mmol/L (ref 134–144)
Total Protein: 6.7 g/dL (ref 6.0–8.5)

## 2019-05-15 LAB — LIPID PANEL
Chol/HDL Ratio: 5.2 ratio — ABNORMAL HIGH (ref 0.0–5.0)
Cholesterol, Total: 218 mg/dL — ABNORMAL HIGH (ref 100–199)
HDL: 42 mg/dL (ref >39)
LDL Chol Calc (NIH): 129 mg/dL — ABNORMAL HIGH (ref 0–99)
Triglycerides: 267 mg/dL — ABNORMAL HIGH (ref 0–149)
VLDL Cholesterol Cal: 47 mg/dL — ABNORMAL HIGH (ref 5–40)

## 2019-05-15 LAB — TSH: TSH: 2.01 u[IU]/mL (ref 0.450–4.500)

## 2019-05-19 NOTE — Progress Notes (Signed)
Yes. That is new. Will do all diabetic education for him as well.

## 2019-05-20 ENCOUNTER — Encounter: Payer: Self-pay | Admitting: Nurse Practitioner

## 2019-05-20 ENCOUNTER — Other Ambulatory Visit: Payer: Self-pay

## 2019-05-20 ENCOUNTER — Ambulatory Visit: Payer: BC Managed Care – PPO | Admitting: Nurse Practitioner

## 2019-05-20 VITALS — BP 139/107 | HR 71 | Temp 97.3°F | Resp 16 | Ht 71.0 in | Wt 268.0 lb

## 2019-05-20 DIAGNOSIS — E782 Mixed hyperlipidemia: Secondary | ICD-10-CM | POA: Diagnosis not present

## 2019-05-20 DIAGNOSIS — I10 Essential (primary) hypertension: Secondary | ICD-10-CM | POA: Diagnosis not present

## 2019-05-20 DIAGNOSIS — H6690 Otitis media, unspecified, unspecified ear: Secondary | ICD-10-CM | POA: Diagnosis not present

## 2019-05-20 DIAGNOSIS — E1169 Type 2 diabetes mellitus with other specified complication: Secondary | ICD-10-CM | POA: Insufficient documentation

## 2019-05-20 DIAGNOSIS — E1165 Type 2 diabetes mellitus with hyperglycemia: Secondary | ICD-10-CM | POA: Diagnosis not present

## 2019-05-20 LAB — POCT GLYCOSYLATED HEMOGLOBIN (HGB A1C): Hemoglobin A1C: 8.5 % — AB (ref 4.0–5.6)

## 2019-05-20 MED ORDER — ONETOUCH VERIO VI STRP: ORAL_STRIP | 12 refills | Status: DC

## 2019-05-20 MED ORDER — ROSUVASTATIN CALCIUM 10 MG PO TABS: 10.0000 mg | ORAL_TABLET | Freq: Every day | ORAL | 3 refills | Status: DC

## 2019-05-20 MED ORDER — ONETOUCH ULTRASOFT LANCETS MISC: 12 refills | Status: DC

## 2019-05-20 MED ORDER — CIPROFLOXACIN-DEXAMETHASONE 0.3-0.1 % OT SUSP: 4.0000 [drp] | Freq: Two times a day (BID) | OTIC | 0 refills | Status: DC

## 2019-05-20 MED ORDER — FARXIGA 10 MG PO TABS: 10.0000 mg | ORAL_TABLET | Freq: Every day | ORAL | 2 refills | Status: DC

## 2019-05-20 NOTE — Progress Notes (Signed)
[facility] [place], [place] [address]  Internal MEDICINE  Office Visit Note  Patient Name: Ronald Mata  [id number]  [id number]  Date of Service: 05/20/2019  Chief Complaint  Patient presents with  . Follow-up    weight management, labs  . Diabetes  . Pain    knee and elbow pain for the past 3 weeks, just regaiined mobility a few days ago  . Dizziness    dizzy spells, usually happens with ear infections     The patient is here for follow up of recent labs. His blood sugar was 309 and his cholesterol was also elevated. This is new finding. Blood sugar last year was around 115. We have checked his HgbA1c and it is 8.5 today. The patient states that he has been fatigued recently. He has also noted increased thirst and increased need to urinate. Cholesterol panel indicates a mild to moderate overall elevation of lipid panel. Other labs were good .      Current Medication: Outpatient Encounter Medications as of 05/20/2019  Medication Sig  . cetirizine (ZYRTEC) 10 MG tablet Take 1 tablet (10 mg total) by mouth daily.  . febuxostat (ULORIC) 40 MG tablet Take 1 tablet (40 mg total) by mouth daily.  . hydrochlorothiazide (HYDRODIURIL) 25 MG tablet Take 1 tablet (25 mg total) by mouth daily.  Marland Kitchen ibuprofen (ADVIL) 800 MG tablet Take 1 tablet (800 mg total) by mouth every 8 (eight) hours as needed for moderate pain.  Marland Kitchen levothyroxine (SYNTHROID) 50 MCG tablet Take 1 tablet (50 mcg total) by mouth daily before breakfast.  . losartan (COZAAR) 100 MG tablet Take 1/2 tab po daily  . Phendimetrazine Tartrate 105 MG CP24 Take 1 capsule (105 mg total) by mouth daily.  . predniSONE (STERAPRED UNI-PAK 21 TAB) 10 MG (21) TBPK tablet 6 day taper - take by mouth as directed for 6 days  . ciprofloxacin-dexamethasone (CIPRODEX) OTIC suspension Place 4 drops into both ears 2 (two) times daily.  . dapagliflozin propanediol (FARXIGA) 10 MG TABS tablet Take 10 mg by mouth daily  before breakfast.  . glucose blood (ONETOUCH VERIO) test strip Blood sugar testing QAM - E11.65  . Lancets (ONETOUCH ULTRASOFT) lancets Blood sugar testing daily - E11.65  . rosuvastatin (CRESTOR) 10 MG tablet Take 1 tablet (10 mg total) by mouth daily.   No facility-administered encounter medications on file as of 05/20/2019.     Surgical History: Past Surgical History:  Procedure Laterality Date  . stomach wrap and achalasia      Medical History: Past Medical History:  Diagnosis Date  . Hypertension   . Thyroid disease     Family History: Family History  Problem Relation Age of Onset  . Diabetes Mother   . Hypertension Mother   . Hyperlipidemia Mother   . Stroke Mother   . Osteoarthritis Mother   . Diabetes Father   . Osteoarthritis Father   . Glaucoma Father     Social History   Socioeconomic History  . Marital status: Married    Spouse name: Not on file  . Number of children: Not on file  . Years of education: Not on file  . Highest education level: Not on file  Occupational History  . Not on file  Social Needs  . Financial resource strain: Not on file  . Food insecurity    Worry: Not on file    Inability: Not on file  . Transportation needs    Medical: Not  on file    Non-medical: Not on file  Tobacco Use  . Smoking status: Never Smoker  . Smokeless tobacco: Never Used  Substance and Sexual Activity  . Alcohol use: No    Alcohol/week: 0.0 standard drinks  . Drug use: No  . Sexual activity: Not on file  Lifestyle  . Physical activity    Days per week: Not on file    Minutes per session: Not on file  . Stress: Not on file  Relationships  . Social Herbalist on phone: Not on file    Gets together: Not on file    Attends religious service: Not on file    Active member of club or organization: Not on file    Attends meetings of clubs or organizations: Not on file    Relationship status: Not on file  . Intimate partner violence    Fear  of current or ex partner: Not on file    Emotionally abused: Not on file    Physically abused: Not on file    Forced sexual activity: Not on file  Other Topics Concern  . Not on file  Social History Narrative  . Not on file      Review of Systems  Constitutional: Negative for chills, fatigue and unexpected weight change.  HENT: Positive for ear pain. Negative for congestion, rhinorrhea, sneezing and sore throat.   Respiratory: Negative for cough, chest tightness, shortness of breath and wheezing.   Cardiovascular: Negative for chest pain and palpitations.       Elevated blood pressure today.   Gastrointestinal: Positive for nausea. Negative for abdominal pain, constipation, diarrhea and vomiting.       Intermittent indigestion.   Endocrine: Positive for polydipsia and polyuria. Negative for cold intolerance and heat intolerance.       The patient's blood sugar was 309 on recent labs.   Genitourinary: Positive for frequency and urgency.  Musculoskeletal: Negative for arthralgias, back pain, joint swelling and neck pain.  Skin: Negative for rash.  Neurological: Positive for headaches. Negative for dizziness, tremors and numbness.  Hematological: Negative for adenopathy. Does not bruise/bleed easily.  Psychiatric/Behavioral: Negative for behavioral problems, sleep disturbance and suicidal ideas. The patient is not nervous/anxious.     Vital Signs: BP (!) 139/107   Pulse 71   Temp (!) 97.3 F (36.3 C)   Resp 16   Ht 5\' 11"  (1.803 m)   Wt 268 lb (121.6 kg)   SpO2 98%   BMI 37.38 kg/m    Physical Exam Vitals signs and nursing note reviewed.  Constitutional:      General: He is not in acute distress.    Appearance: Normal appearance. He is well-developed. He is obese. He is not diaphoretic.  HENT:     Head: Normocephalic and atraumatic.     Mouth/Throat:     Pharynx: No oropharyngeal exudate.  Eyes:     Pupils: Pupils are equal, round, and reactive to light.  Neck:      Musculoskeletal: Normal range of motion and neck supple.     Thyroid: No thyromegaly.     Vascular: No JVD.     Trachea: No tracheal deviation.  Cardiovascular:     Rate and Rhythm: Normal rate and regular rhythm.     Heart sounds: Normal heart sounds. No murmur. No friction rub. No gallop.   Pulmonary:     Effort: Pulmonary effort is normal. No respiratory distress.     Breath sounds:  Normal breath sounds. No wheezing or rales.  Chest:     Chest wall: No tenderness.  Abdominal:     Palpations: Abdomen is soft.  Musculoskeletal: Normal range of motion.     Left lower leg: No edema.  Lymphadenopathy:     Cervical: No cervical adenopathy.  Skin:    General: Skin is warm and dry.  Neurological:     Mental Status: He is alert and oriented to person, place, and time.     Cranial Nerves: No cranial nerve deficit.  Psychiatric:        Behavior: Behavior normal.        Thought Content: Thought content normal.        Judgment: Judgment normal.    Assessment/Plan: 1. Type 2 diabetes mellitus with hyperglycemia, unspecified whether long term insulin use (HCC) - POCT HgB A1C 8.5 today. Started Farxiga 10mg  tablets. Samples were provided along with copay card. Diabetic diet information reviewed with the patient and written information was provided. Demonstrated to patient how to use glucometer. Patient voiced understanding. Will bring blood sugar log to his next visit . - dapagliflozin propanediol (FARXIGA) 10 MG TABS tablet; Take 10 mg by mouth daily before breakfast.  Dispense: 30 tablet; Refill: 2 - glucose blood (ONETOUCH VERIO) test strip; Blood sugar testing QAM - E11.65  Dispense: 100 each; Refill: 12 - Lancets (ONETOUCH ULTRASOFT) lancets; Blood sugar testing daily - E11.65  Dispense: 100 each; Refill: 12  2. Acute otitis media, unspecified otitis media type ciprodex ear drops should be inserted into both ears twice daily.  - ciprofloxacin-dexamethasone (CIPRODEX) OTIC suspension;  Place 4 drops into both ears 2 (two) times daily.  Dispense: 7.5 mL; Refill: 0  3. Mixed hyperlipidemia Start crestor 10mg  daily. Will recheck lipid panel at next visit.  - rosuvastatin (CRESTOR) 10 MG tablet; Take 1 tablet (10 mg total) by mouth daily.  Dispense: 30 tablet; Refill: 3  4. Essential hypertension Stable. Continue bp medication as prescribed   General Counseling: Lyndall verbalizes understanding of the findings of todays visit and agrees with plan of treatment. I have discussed any further diagnostic evaluation that may be needed or ordered today. We also reviewed his medications today. he has been encouraged to call the office with any questions or concerns that should arise related to todays visit.  Diabetes Counseling:  1. Addition of ACE inh/ ARB'S for nephroprotection. Microalbumin is updated  2. Diabetic foot care, prevention of complications. Podiatry consult 3. Exercise and lose weight.  4. Diabetic eye examination, Diabetic eye exam is updated  5. Monitor blood sugar closlely. nutrition counseling.  6. Sign and symptoms of hypoglycemia including shaking sweating,confusion and headaches.  This patient was seen by Leretha Pol FNP Collaboration with Dr Lavera Guise as a part of collaborative care agreement  Orders Placed This Encounter  Procedures  . POCT HgB A1C    Meds ordered this encounter  Medications  . dapagliflozin propanediol (FARXIGA) 10 MG TABS tablet    Sig: Take 10 mg by mouth daily before breakfast.    Dispense:  30 tablet    Refill:  2    Patient given samples along with $0 copay card for this prescription.    Order Specific Question:   Supervising Provider    Answer:   Lavera Guise [id number]  . rosuvastatin (CRESTOR) 10 MG tablet    Sig: Take 1 tablet (10 mg total) by mouth daily.    Dispense:  30 tablet  Refill:  3    Order Specific Question:   Supervising Provider    Answer:   Lavera Guise [id number]  . glucose blood (ONETOUCH VERIO) test  strip    Sig: Blood sugar testing QAM - E11.65    Dispense:  100 each    Refill:  12    Order Specific Question:   Supervising Provider    Answer:   Lavera Guise [place]  . Lancets (ONETOUCH ULTRASOFT) lancets    Sig: Blood sugar testing daily - E11.65    Dispense:  100 each    Refill:  12    Order Specific Question:   Supervising Provider    Answer:   Lavera Guise [place]  . ciprofloxacin-dexamethasone (CIPRODEX) OTIC suspension    Sig: Place 4 drops into both ears 2 (two) times daily.    Dispense:  7.5 mL    Refill:  0    Order Specific Question:   Supervising Provider    Answer:   Lavera Guise [id number]    Time spent: 49 Minutes      Dr Lavera Guise Internal medicine

## 2019-06-12 ENCOUNTER — Telehealth: Payer: Self-pay

## 2019-06-12 NOTE — Telephone Encounter (Signed)
CONFIRMED AND SCREENED FOR COVID FOR 06-16-19. ADVISED PATIENT TO BRING BLOOD SUGAR LOG TO APPOINTMENT.

## 2019-06-16 ENCOUNTER — Other Ambulatory Visit: Payer: Self-pay

## 2019-06-16 ENCOUNTER — Ambulatory Visit: Payer: BC Managed Care – PPO | Admitting: Nurse Practitioner

## 2019-06-16 ENCOUNTER — Encounter: Payer: Self-pay | Admitting: Nurse Practitioner

## 2019-06-16 VITALS — BP 138/98 | HR 80 | Temp 97.5°F | Resp 16 | Ht 71.0 in | Wt 267.6 lb

## 2019-06-16 DIAGNOSIS — I10 Essential (primary) hypertension: Secondary | ICD-10-CM

## 2019-06-16 DIAGNOSIS — E782 Mixed hyperlipidemia: Secondary | ICD-10-CM | POA: Diagnosis not present

## 2019-06-16 DIAGNOSIS — E1165 Type 2 diabetes mellitus with hyperglycemia: Secondary | ICD-10-CM

## 2019-06-16 NOTE — Progress Notes (Signed)
[facility] [place], [place] [address]  Internal MEDICINE  Office Visit Note  Patient Name: Ronald Mata  [id number]  [id number]  Date of Service: 06/16/2019  Chief Complaint  Patient presents with  . Diabetes    The patient is here for routine follow up. He was started on farxiga 10mg  due to new onset of diabetes. States that his is checking his blood sugars daily, approximately four hours after eating at night. Blood sugars are generally running about 130 to 150. He is tolerating this medication very well. Reports no negative side effects. He was also started on crestor for high cholesterol did have some joint pain associated with this at first. Started taking CoQ10 when he takes the rosuvastatin. Joint pain has resolved since then. Blood pressure is more elevated today than it has been in some time. He states that both ankles have been hurting since Saturday. Was out in the park, celebrating his daughter's third birthday. Was on rough terrain in dress shoes. Really has caused some inflammation and pain. Believes this is contributing to the elevation in his blood pressure.       Current Medication: Outpatient Encounter Medications as of 06/16/2019  Medication Sig  . cetirizine (ZYRTEC) 10 MG tablet Take 1 tablet (10 mg total) by mouth daily.  . ciprofloxacin-dexamethasone (CIPRODEX) OTIC suspension Place 4 drops into both ears 2 (two) times daily.  . dapagliflozin propanediol (FARXIGA) 10 MG TABS tablet Take 10 mg by mouth daily before breakfast.  . febuxostat (ULORIC) 40 MG tablet Take 1 tablet (40 mg total) by mouth daily.  Marland Kitchen glucose blood (ONETOUCH VERIO) test strip Blood sugar testing QAM - E11.65  . hydrochlorothiazide (HYDRODIURIL) 25 MG tablet Take 1 tablet (25 mg total) by mouth daily.  Marland Kitchen ibuprofen (ADVIL) 800 MG tablet Take 1 tablet (800 mg total) by mouth every 8 (eight) hours as needed for moderate pain.  . Lancets (ONETOUCH ULTRASOFT) lancets  Blood sugar testing daily - E11.65  . levothyroxine (SYNTHROID) 50 MCG tablet Take 1 tablet (50 mcg total) by mouth daily before breakfast.  . losartan (COZAAR) 100 MG tablet Take 1/2 tab po daily  . Phendimetrazine Tartrate 105 MG CP24 Take 1 capsule (105 mg total) by mouth daily.  . rosuvastatin (CRESTOR) 10 MG tablet Take 1 tablet (10 mg total) by mouth daily.  . [DISCONTINUED] predniSONE (STERAPRED UNI-PAK 21 TAB) 10 MG (21) TBPK tablet 6 day taper - take by mouth as directed for 6 days (Patient not taking: Reported on 06/16/2019)   No facility-administered encounter medications on file as of 06/16/2019.     Surgical History: Past Surgical History:  Procedure Laterality Date  . stomach wrap and achalasia      Medical History: Past Medical History:  Diagnosis Date  . Hypertension   . Thyroid disease     Family History: Family History  Problem Relation Age of Onset  . Diabetes Mother   . Hypertension Mother   . Hyperlipidemia Mother   . Stroke Mother   . Osteoarthritis Mother   . Diabetes Father   . Osteoarthritis Father   . Glaucoma Father     Social History   Socioeconomic History  . Marital status: Married    Spouse name: Not on file  . Number of children: Not on file  . Years of education: Not on file  . Highest education level: Not on file  Occupational History  . Not on file  Social Needs  . Financial  resource strain: Not on file  . Food insecurity    Worry: Not on file    Inability: Not on file  . Transportation needs    Medical: Not on file    Non-medical: Not on file  Tobacco Use  . Smoking status: Never Smoker  . Smokeless tobacco: Never Used  Substance and Sexual Activity  . Alcohol use: No    Alcohol/week: 0.0 standard drinks  . Drug use: No  . Sexual activity: Not on file  Lifestyle  . Physical activity    Days per week: Not on file    Minutes per session: Not on file  . Stress: Not on file  Relationships  . Social Product manager on phone: Not on file    Gets together: Not on file    Attends religious service: Not on file    Active member of club or organization: Not on file    Attends meetings of clubs or organizations: Not on file    Relationship status: Not on file  . Intimate partner violence    Fear of current or ex partner: Not on file    Emotionally abused: Not on file    Physically abused: Not on file    Forced sexual activity: Not on file  Other Topics Concern  . Not on file  Social History Narrative  . Not on file      Review of Systems  Constitutional: Negative for chills, fatigue and unexpected weight change.  HENT: Negative for congestion, ear pain, rhinorrhea, sneezing and sore throat.   Respiratory: Negative for cough, chest tightness, shortness of breath and wheezing.   Cardiovascular: Negative for chest pain and palpitations.  Gastrointestinal: Negative for abdominal pain, constipation, diarrhea, nausea and vomiting.  Endocrine: Positive for polydipsia and polyuria. Negative for cold intolerance and heat intolerance.       Blood sugars are improved. Generally running around 130.   Genitourinary: Positive for frequency. Negative for urgency.  Musculoskeletal: Positive for arthralgias. Negative for back pain, joint swelling and neck pain.       Bilateral ankle pain.   Skin: Negative for rash.  Neurological: Positive for headaches. Negative for dizziness, tremors and numbness.  Hematological: Negative for adenopathy. Does not bruise/bleed easily.  Psychiatric/Behavioral: Negative for behavioral problems, sleep disturbance and suicidal ideas. The patient is not nervous/anxious.    Today's Vitals   06/16/19 0951  BP: (!) 138/98  Pulse: 80  Resp: 16  Temp: (!) 97.5 F (36.4 C)  SpO2: 98%  Weight: 267 lb 9.6 oz (121.4 kg)  Height: 5\' 11"  (1.803 m)   Body mass index is 37.32 kg/m.   Physical Exam Vitals signs and nursing note reviewed.  Constitutional:      General: He is  not in acute distress.    Appearance: Normal appearance. He is well-developed. He is not diaphoretic.  HENT:     Head: Normocephalic and atraumatic.     Mouth/Throat:     Pharynx: No oropharyngeal exudate.  Eyes:     Pupils: Pupils are equal, round, and reactive to light.  Neck:     Musculoskeletal: Normal range of motion and neck supple.     Thyroid: No thyromegaly.     Vascular: No JVD.     Trachea: No tracheal deviation.  Cardiovascular:     Rate and Rhythm: Normal rate and regular rhythm.     Heart sounds: Normal heart sounds. No murmur. No friction rub. No gallop.  Pulmonary:     Effort: Pulmonary effort is normal. No respiratory distress.     Breath sounds: Normal breath sounds. No wheezing or rales.  Chest:     Chest wall: No tenderness.  Abdominal:     Palpations: Abdomen is soft.  Musculoskeletal: Normal range of motion.     Left lower leg: No edema.     Comments: Bilateral ankle pain. No swelling or evidence of bony abnormality noted at this time.   Lymphadenopathy:     Cervical: No cervical adenopathy.  Skin:    General: Skin is warm and dry.  Neurological:     Mental Status: He is alert and oriented to person, place, and time.     Cranial Nerves: No cranial nerve deficit.  Psychiatric:        Behavior: Behavior normal.        Thought Content: Thought content normal.        Judgment: Judgment normal.   Assessment/Plan: 1. Type 2 diabetes mellitus with hyperglycemia, without long-term current use of insulin ([place]) Reviewed patient's blood sugar log. Running in the low to mid 100s. Doing well with farxiga. Continue daily. Monitor blood sugars daily. Recommend he limit late night snacking. Should improve blood sugars and blood pressures.   2. Mixed hyperlipidemia Tolerating crestor with CoQ10 well.   3. Essential hypertension Slightly elevated today, no changes made to bp medication. Monitor closely.  General Counseling: Ronald Mata verbalizes understanding of the  findings of todays visit and agrees with plan of treatment. I have discussed any further diagnostic evaluation that may be needed or ordered today. We also reviewed his medications today. he has been encouraged to call the office with any questions or concerns that should arise related to todays visit.  Diabetes Counseling:  1. Addition of ACE inh/ ARB'S for nephroprotection. Microalbumin is updated  2. Diabetic foot care, prevention of complications. Podiatry consult 3. Exercise and lose weight.  4. Diabetic eye examination, Diabetic eye exam is updated  5. Monitor blood sugar closlely. nutrition counseling.  6. Sign and symptoms of hypoglycemia including shaking sweating,confusion and headaches.  This patient was seen by Leretha Pol FNP Collaboration with Dr Lavera Guise as a part of collaborative care agreement   Time spent: 25 Minutes      Dr Lavera Guise Internal medicine

## 2019-06-22 IMAGING — MR MR LUMBAR SPINE W/O CM
4 of 5 series · 18 of 48 positions shown · non-contrast
Comparison: None.

CLINICAL DATA: Lower back pain with stiffness. Pain radiates down
the legs.

EXAM:
MRI LUMBAR SPINE WITHOUT CONTRAST
TECHNIQUE: Multiplanar, multisequence MR imaging of the lumbar spine was
performed. No intravenous contrast was administered.

[Series 5: T2 · sagittal · 4.0mm · 0.73mm/px · 5 of 15 slices shown (1 of 2)]
[im 1/15]
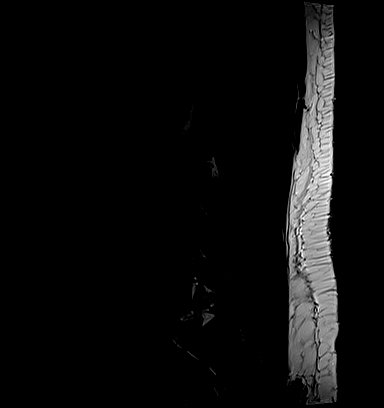
[im 4/15]
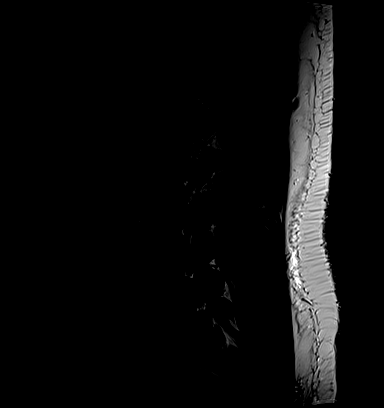
[im 8/15]
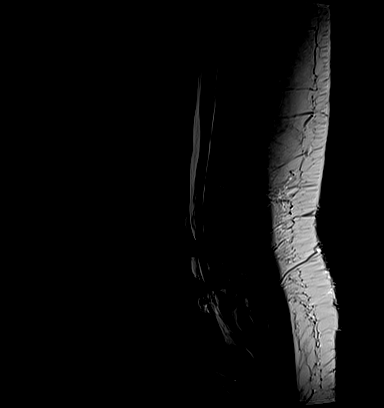
[im 11/15]
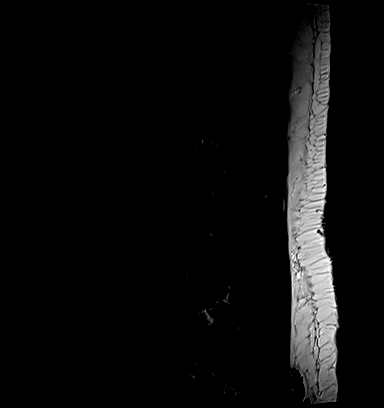
[im 15/15]
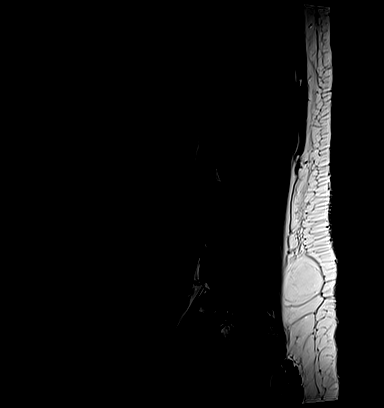

[Series 7: T1 · sagittal · 4.0mm · 0.73mm/px · 3 of 15 slices shown (1 of 2)]
[im 3/15]
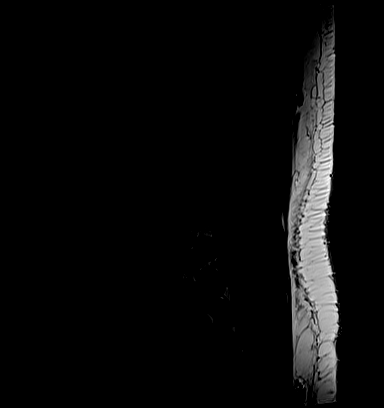
[im 9/15]
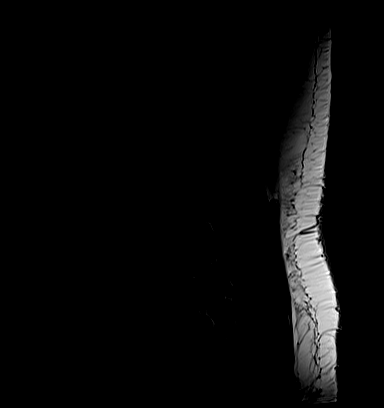
[im 15/15]
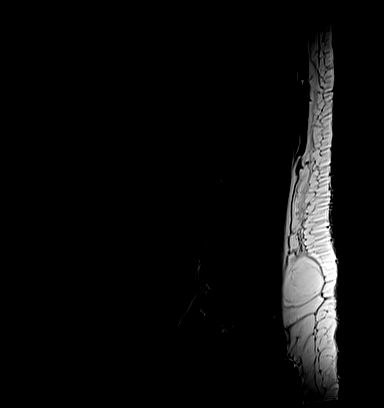

[Series 10: T1 · axial · 4.0mm · 0.28mm/px · z∈[-136,+35]mm · 3 of 42 slices shown (2 of 2)]
[im 6/42]
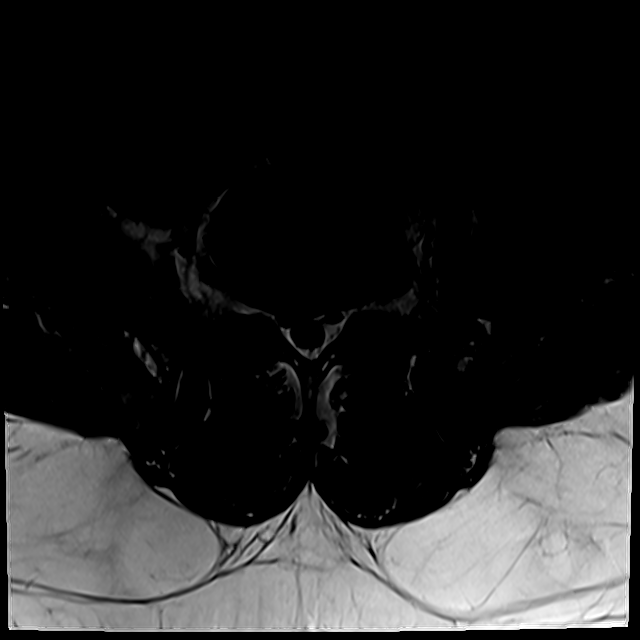
[im 22/42]
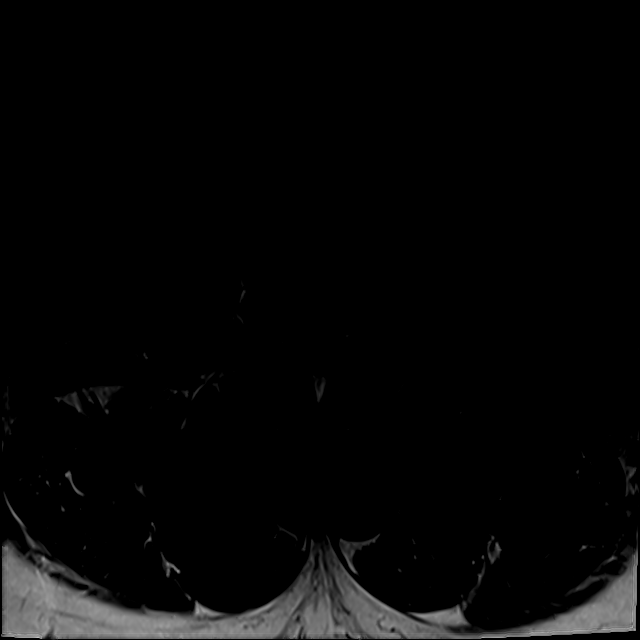
[im 36/42]
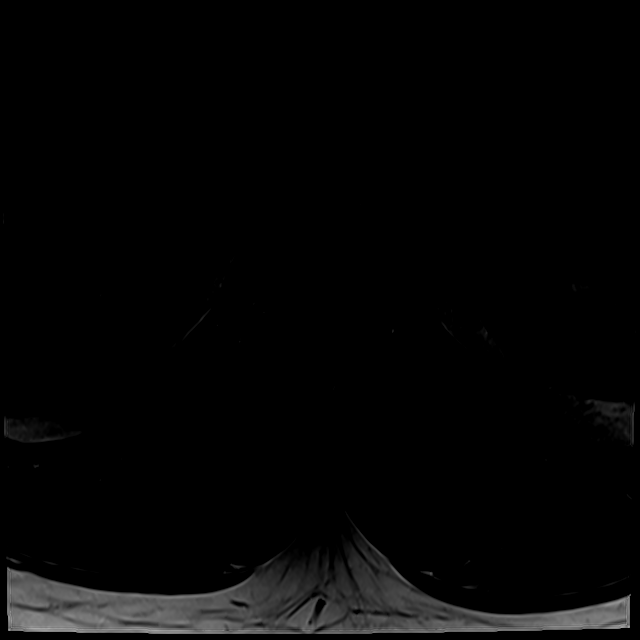

[Series 13: T2 · axial · 4.0mm · 0.28mm/px · z∈[-150,+35]mm · 7 of 42 slices shown (2 of 2)]
[im 3/42]
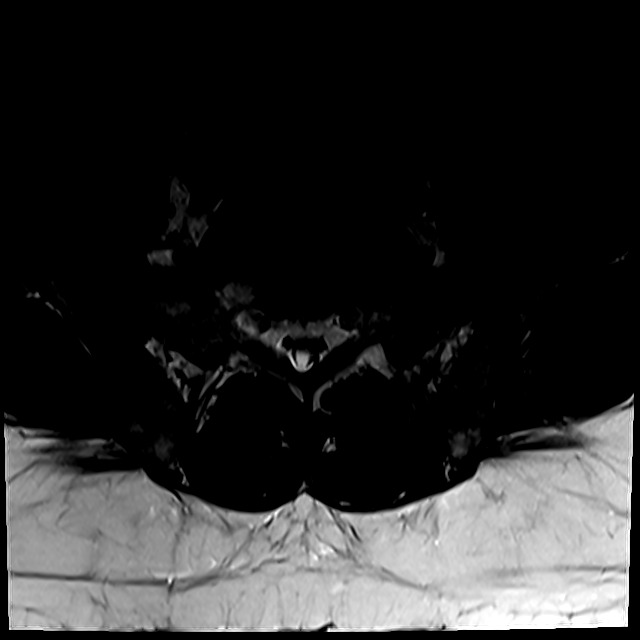
[im 6/42]
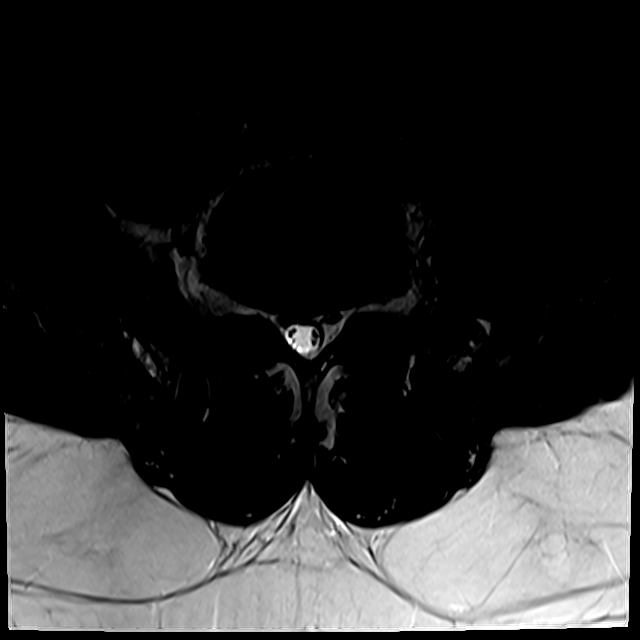
[im 9/42]
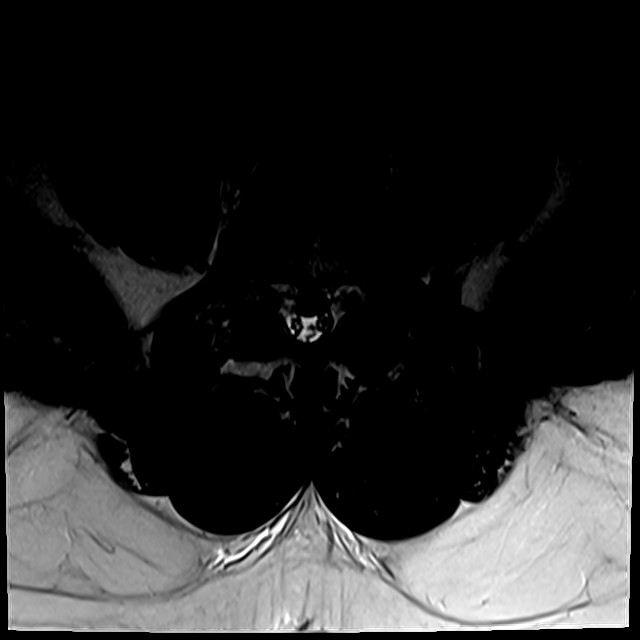
[im 14/42]
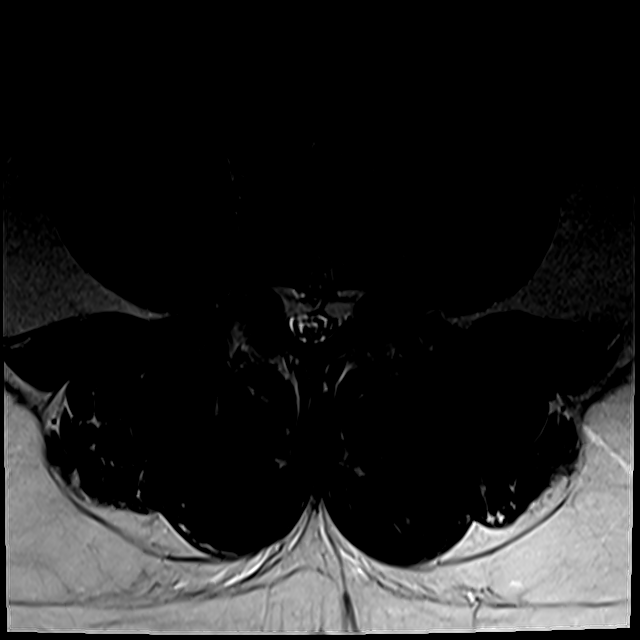
[im 20/42]
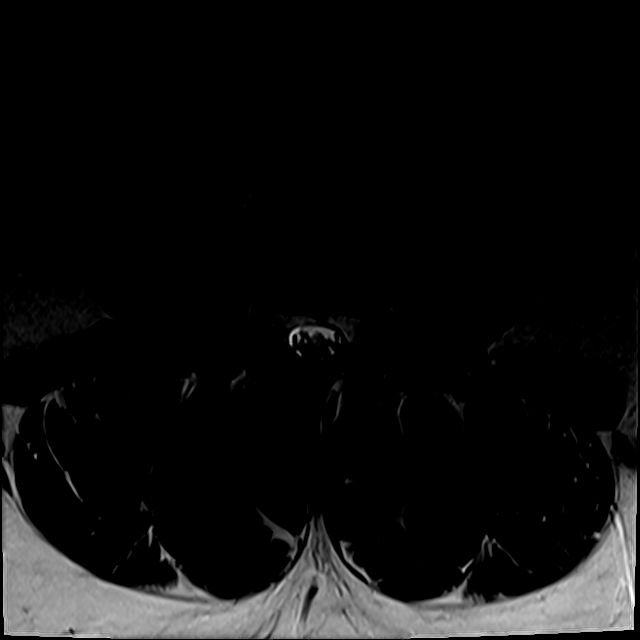
[im 22/42]
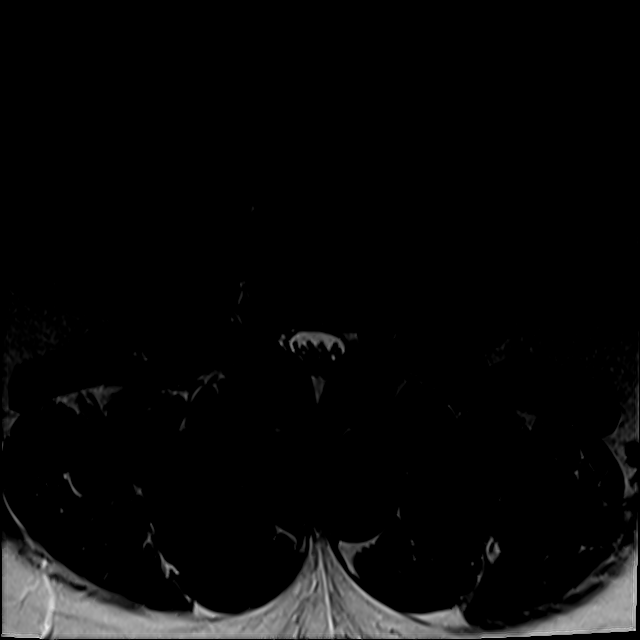
[im 36/42]
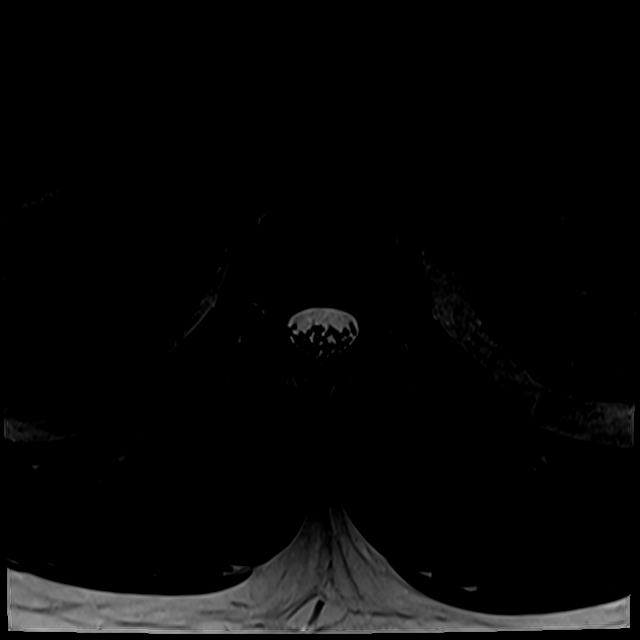

[18 of 48 positions shown; findings below may reference images not displayed]

FINDINGS: Segmentation:  Standard based on the available coverage

Alignment:  Normal

Vertebrae: Degenerative fatty marrow conversion about the L4-5 disc
space.

Conus medullaris and cauda equina: Conus extends to the T12-L1
level. Conus and cauda equina appear normal.

Paraspinal and other soft tissues: Negative

Disc levels:

T12- L1: Unremarkable.

L1-L2: Unremarkable.

L2-L3: Unremarkable.

L3-L4: Narrow disc with central protrusion and buttressing
osteophyte. There is high-grade spinal stenosis. The foramina are
patent

L4-L5: Advanced disc narrowing with endplate degeneration. There is
a downward pointing disc herniation with buttressing osteophyte.
High-grade spinal stenosis. The foramina are patent

L5-S1:Disc narrowing and bulge with central protrusion and
buttressing osteophyte. This herniation contacts the right S1 nerve
root with mild mass-effect. The foramina are patent.
IMPRESSION: 1. Disc degeneration with herniations at L3-4 to L5-S1.
2. L3-4 and L4-5 high-grade spinal stenosis.
3. The L5-S1 protrusion exerts mild mass effect on the right S1
nerve root.

## 2019-06-26 ENCOUNTER — Other Ambulatory Visit: Payer: Self-pay

## 2019-06-26 DIAGNOSIS — M1A29X1 Drug-induced chronic gout, multiple sites, with tophus (tophi): Secondary | ICD-10-CM

## 2019-06-26 MED ORDER — FEBUXOSTAT 40 MG PO TABS: 40.0000 mg | ORAL_TABLET | Freq: Every day | ORAL | 3 refills | Status: DC

## 2019-07-16 ENCOUNTER — Other Ambulatory Visit: Payer: Self-pay

## 2019-07-16 DIAGNOSIS — E1165 Type 2 diabetes mellitus with hyperglycemia: Secondary | ICD-10-CM

## 2019-07-16 MED ORDER — FARXIGA 10 MG PO TABS: 10.0000 mg | ORAL_TABLET | Freq: Every day | ORAL | 1 refills | Status: DC

## 2019-08-09 ENCOUNTER — Other Ambulatory Visit: Payer: Self-pay | Admitting: Adult Health

## 2019-08-09 DIAGNOSIS — I10 Essential (primary) hypertension: Secondary | ICD-10-CM

## 2019-08-13 ENCOUNTER — Other Ambulatory Visit: Payer: Self-pay

## 2019-08-13 DIAGNOSIS — I10 Essential (primary) hypertension: Secondary | ICD-10-CM

## 2019-08-13 MED ORDER — HYDROCHLOROTHIAZIDE 25 MG PO TABS: ORAL_TABLET | ORAL | 1 refills | Status: DC

## 2019-08-15 ENCOUNTER — Telehealth: Payer: Self-pay

## 2019-08-15 NOTE — Telephone Encounter (Signed)
Called lmom informing patient of virtual visit. klh 

## 2019-08-19 ENCOUNTER — Ambulatory Visit: Payer: BC Managed Care – PPO | Admitting: Adult Health

## 2019-08-19 ENCOUNTER — Other Ambulatory Visit: Payer: Self-pay

## 2019-08-19 ENCOUNTER — Encounter: Payer: Self-pay | Admitting: Adult Health

## 2019-08-19 VITALS — BP 145/94 | HR 71 | Temp 97.9°F | Resp 16 | Ht 71.0 in | Wt 270.0 lb

## 2019-08-19 DIAGNOSIS — I10 Essential (primary) hypertension: Secondary | ICD-10-CM | POA: Diagnosis not present

## 2019-08-19 DIAGNOSIS — J309 Allergic rhinitis, unspecified: Secondary | ICD-10-CM | POA: Diagnosis not present

## 2019-08-19 DIAGNOSIS — E782 Mixed hyperlipidemia: Secondary | ICD-10-CM | POA: Diagnosis not present

## 2019-08-19 DIAGNOSIS — M1A29X1 Drug-induced chronic gout, multiple sites, with tophus (tophi): Secondary | ICD-10-CM

## 2019-08-19 DIAGNOSIS — E039 Hypothyroidism, unspecified: Secondary | ICD-10-CM

## 2019-08-19 DIAGNOSIS — H6063 Unspecified chronic otitis externa, bilateral: Secondary | ICD-10-CM

## 2019-08-19 DIAGNOSIS — E1165 Type 2 diabetes mellitus with hyperglycemia: Secondary | ICD-10-CM | POA: Diagnosis not present

## 2019-08-19 LAB — POCT GLYCOSYLATED HEMOGLOBIN (HGB A1C): Hemoglobin A1C: 7.8 % — AB (ref 4.0–5.6)

## 2019-08-19 MED ORDER — FLUTICASONE PROPIONATE 50 MCG/ACT NA SUSP: 2.0000 | Freq: Every day | NASAL | 6 refills | Status: DC

## 2019-08-19 MED ORDER — CETIRIZINE HCL 10 MG PO TABS: 10.0000 mg | ORAL_TABLET | Freq: Every day | ORAL | 2 refills | Status: DC

## 2019-08-19 MED ORDER — LEVOTHYROXINE SODIUM 50 MCG PO TABS: 50.0000 ug | ORAL_TABLET | Freq: Every day | ORAL | 2 refills | Status: DC

## 2019-08-19 MED ORDER — FEBUXOSTAT 40 MG PO TABS: 40.0000 mg | ORAL_TABLET | Freq: Every day | ORAL | 3 refills | Status: DC

## 2019-08-19 MED ORDER — HYDROCHLOROTHIAZIDE 25 MG PO TABS: ORAL_TABLET | ORAL | 1 refills | Status: DC

## 2019-08-19 MED ORDER — FARXIGA 10 MG PO TABS: 10.0000 mg | ORAL_TABLET | Freq: Every day | ORAL | 1 refills | Status: DC

## 2019-08-19 MED ORDER — LOSARTAN POTASSIUM 100 MG PO TABS: ORAL_TABLET | ORAL | 1 refills | Status: DC

## 2019-08-19 NOTE — Progress Notes (Signed)
[facility] [place], [place] [address]  Internal MEDICINE  Office Visit Note  Patient Name: Ronald Mata  [id number]  [id number]  Date of Service: 08/19/2019  Chief Complaint  Patient presents with  . Diabetes  . Hypertension    HPI Patient is a newly diagnosed diabetic as he had an impaired fasting blood glucose 04/2019 followed by an elevated A1C of 8.5. He was started on Farxiga 10 mg daily at that time. Repeat A1C today is improved at 7.8, he has been checking his blood sugars every morning before eating and reports they have been averaging 150-160 each morning. He has a great support system with his wife and they have been experimenting with their diet to see what helps improve his blood sugars. Due to his recent diagnosis of gout he avoids many foods that is known to trigger his gout flares. He has been attempting to exercise daily, struggling with balancing his schedule as he is working full time and in school full time as well as having a family. He has been experiencing recent muscle cramping in his legs and symptoms of pelvic-girdle weakness, he was also started on a statin in October due to impaired lipid panel as well as new diagnosis of diabetes. Discussed a trial of stopping his statin for 3 weeks to see if symptoms resolved.    Current Medication: Outpatient Encounter Medications as of 08/19/2019  Medication Sig  . cetirizine (ZYRTEC) 10 MG tablet Take 1 tablet (10 mg total) by mouth daily.  . ciprofloxacin-dexamethasone (CIPRODEX) OTIC suspension Place 4 drops into both ears 2 (two) times daily.  . dapagliflozin propanediol (FARXIGA) 10 MG TABS tablet Take 10 mg by mouth daily before breakfast.  . febuxostat (ULORIC) 40 MG tablet Take 1 tablet (40 mg total) by mouth daily.  Marland Kitchen glucose blood (ONETOUCH VERIO) test strip Blood sugar testing QAM - E11.65  . hydrochlorothiazide (HYDRODIURIL) 25 MG tablet TAKE 1 TABLET(25 MG) BY MOUTH DAILY  . ibuprofen  (ADVIL) 800 MG tablet Take 1 tablet (800 mg total) by mouth every 8 (eight) hours as needed for moderate pain.  . Lancets (ONETOUCH ULTRASOFT) lancets Blood sugar testing daily - E11.65  . levothyroxine (SYNTHROID) 50 MCG tablet Take 1 tablet (50 mcg total) by mouth daily before breakfast.  . losartan (COZAAR) 100 MG tablet Take 1/2 tab po daily  . Phendimetrazine Tartrate 105 MG CP24 Take 1 capsule (105 mg total) by mouth daily.  . rosuvastatin (CRESTOR) 10 MG tablet Take 1 tablet (10 mg total) by mouth daily.   No facility-administered encounter medications on file as of 08/19/2019.    Surgical History: Past Surgical History:  Procedure Laterality Date  . stomach wrap and achalasia      Medical History: Past Medical History:  Diagnosis Date  . Hypertension   . Thyroid disease     Family History: Family History  Problem Relation Age of Onset  . Diabetes Mother   . Hypertension Mother   . Hyperlipidemia Mother   . Stroke Mother   . Osteoarthritis Mother   . Diabetes Father   . Osteoarthritis Father   . Glaucoma Father     Social History   Socioeconomic History  . Marital status: Married    Spouse name: Not on file  . Number of children: Not on file  . Years of education: Not on file  . Highest education level: Not on file  Occupational History  . Not on file  Tobacco Use  .  Smoking status: Never Smoker  . Smokeless tobacco: Never Used  Substance and Sexual Activity  . Alcohol use: No    Alcohol/week: 0.0 standard drinks  . Drug use: No  . Sexual activity: Not on file  Other Topics Concern  . Not on file  Social History Narrative  . Not on file   Social Determinants of Health   Financial Resource Strain:   . Difficulty of Paying Living Expenses: Not on file  Food Insecurity:   . Worried About Charity fundraiser in the Last Year: Not on file  . Ran Out of Food in the Last Year: Not on file  Transportation Needs:   . Lack of Transportation (Medical):  Not on file  . Lack of Transportation (Non-Medical): Not on file  Physical Activity:   . Days of Exercise per Week: Not on file  . Minutes of Exercise per Session: Not on file  Stress:   . Feeling of Stress : Not on file  Social Connections:   . Frequency of Communication with Friends and Family: Not on file  . Frequency of Social Gatherings with Friends and Family: Not on file  . Attends Religious Services: Not on file  . Active Member of Clubs or Organizations: Not on file  . Attends Archivist Meetings: Not on file  . Marital Status: Not on file  Intimate Partner Violence:   . Fear of Current or Ex-Partner: Not on file  . Emotionally Abused: Not on file  . Physically Abused: Not on file  . Sexually Abused: Not on file      Review of Systems  Constitutional: Negative.  Negative for chills, fatigue and unexpected weight change.  HENT: Negative.  Negative for congestion, rhinorrhea, sneezing and sore throat.        Ear fullness and pressure  Eyes: Negative for redness.  Respiratory: Negative.  Negative for cough, chest tightness and shortness of breath.   Cardiovascular: Negative.  Negative for chest pain and palpitations.  Gastrointestinal: Negative.  Negative for abdominal pain, constipation, diarrhea, nausea and vomiting.  Endocrine: Negative.   Genitourinary: Negative.  Negative for dysuria and frequency.  Musculoskeletal: Negative.  Negative for arthralgias, back pain, joint swelling and neck pain.  Skin: Negative.  Negative for rash.  Allergic/Immunologic: Negative.   Neurological: Negative.  Negative for tremors and numbness.  Hematological: Negative for adenopathy. Does not bruise/bleed easily.  Psychiatric/Behavioral: Negative.  Negative for behavioral problems, sleep disturbance and suicidal ideas. The patient is not nervous/anxious.     Vital Signs: BP (!) 145/94   Pulse 71   Temp 97.9 F (36.6 C)   Resp 16   Ht 5\' 11"  (1.803 m)   Wt 270 lb (122.5  kg)   SpO2 97%   BMI 37.66 kg/m    Physical Exam Vitals and nursing note reviewed.  Constitutional:      General: He is not in acute distress.    Appearance: He is well-developed. He is not diaphoretic.  HENT:     Head: Normocephalic and atraumatic.     Right Ear: Swelling and tenderness present.     Left Ear: Tenderness present. There is impacted cerumen.     Mouth/Throat:     Pharynx: No oropharyngeal exudate.  Eyes:     Pupils: Pupils are equal, round, and reactive to light.  Neck:     Thyroid: No thyromegaly.     Vascular: No JVD.     Trachea: No tracheal deviation.  Cardiovascular:  Rate and Rhythm: Normal rate and regular rhythm.     Heart sounds: Normal heart sounds. No murmur. No friction rub. No gallop.   Pulmonary:     Effort: Pulmonary effort is normal. No respiratory distress.     Breath sounds: Normal breath sounds. No wheezing or rales.  Chest:     Chest wall: No tenderness.  Abdominal:     Palpations: Abdomen is soft.  Musculoskeletal:        General: Normal range of motion.     Cervical back: Normal range of motion and neck supple.  Lymphadenopathy:     Cervical: No cervical adenopathy.  Skin:    General: Skin is warm and dry.  Neurological:     Mental Status: He is alert and oriented to person, place, and time.     Cranial Nerves: No cranial nerve deficit.  Psychiatric:        Behavior: Behavior normal.        Thought Content: Thought content normal.        Judgment: Judgment normal.     Assessment/Plan: 1. Type 2 diabetes mellitus with hyperglycemia, unspecified whether long term insulin use (HCC) With improvement in A1C after starting Farxiga 3 months prior will continue with current plan and reassess A1C in 3 months. Encouraged to continue with improving his diet and increasing his daily exercise. Encouraged to continue monitoring his fasting blood glucose levels each morning. - POCT HgB A1C - dapagliflozin propanediol (FARXIGA) 10 MG TABS  tablet; Take 10 mg by mouth daily before breakfast.  Dispense: 90 tablet; Refill: 1  2. Mixed hyperlipidemia Trial of stopping Crestor for 3 weeks to see if there is improvement in his muscle cramping. Discussed calling office after 3 week trial, if symptoms resolved will start on non-statin therapy.  3. Allergic rhinitis, unspecified seasonality, unspecified trigger Chronic rhinitis, start flonase daily to see if symptoms improve. Followed by ENT. - fluticasone (FLONASE) 50 MCG/ACT nasal spray; Place 2 sprays into both nostrils daily.  Dispense: 16 g; Refill: 6 - cetirizine (ZYRTEC) 10 MG tablet; Take 1 tablet (10 mg total) by mouth daily.  Dispense: 90 tablet; Refill: 2  4. Essential hypertension Slightly elevated blood pressure in office today, reports he feels this is due to the muscle cramping and pain he has been experiencing. Reports he routinely monitors his blood pressure at home with readings 130/80's. - losartan (COZAAR) 100 MG tablet; Take 1/2 tab po daily  Dispense: 45 tablet; Refill: 1 - hydrochlorothiazide (HYDRODIURIL) 25 MG tablet; TAKE 1 TABLET(25 MG) BY MOUTH DAILY  Dispense: 90 tablet; Refill: 1  5. Chronic otitis externa of both ears, unspecified type Followed by ENT, has been prescribed ear drops for dry ears and wax build up. With exam findings today, encouraged him to start those ear drops. Will follow up.  6. Acquired hypothyroidism Stable on current therapy, continue to monitor. - levothyroxine (SYNTHROID) 50 MCG tablet; Take 1 tablet (50 mcg total) by mouth daily before breakfast.  Dispense: 90 tablet; Refill: 2  7. 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 - febuxostat (ULORIC) 40 MG tablet; Take 1 tablet (40 mg total) by mouth daily.  Dispense: 30 tablet; Refill: 3  General Counseling: Giancarlo verbalizes understanding of the findings of todays visit and agrees with plan of treatment. I have discussed any  further diagnostic evaluation that may be needed or ordered today. We also reviewed his medications today. he has been encouraged to  call the office with any questions or concerns that should arise related to todays visit.    Orders Placed This Encounter  Procedures  . POCT HgB A1C    No orders of the defined types were placed in this encounter.   Time spent: 30 Minutes   This patient was seen by Orson Gear AGNP-C in Collaboration with Dr Lavera Guise as a part of collaborative care agreement     Kendell Bane AGNP-C Internal medicine

## 2019-09-09 ENCOUNTER — Other Ambulatory Visit: Payer: Self-pay

## 2019-09-09 DIAGNOSIS — E782 Mixed hyperlipidemia: Secondary | ICD-10-CM

## 2019-09-09 MED ORDER — ROSUVASTATIN CALCIUM 10 MG PO TABS: 10.0000 mg | ORAL_TABLET | Freq: Every day | ORAL | 3 refills | Status: DC

## 2019-10-21 ENCOUNTER — Other Ambulatory Visit: Payer: Self-pay

## 2019-10-21 DIAGNOSIS — M1A29X1 Drug-induced chronic gout, multiple sites, with tophus (tophi): Secondary | ICD-10-CM

## 2019-10-21 MED ORDER — FEBUXOSTAT 40 MG PO TABS: 40.0000 mg | ORAL_TABLET | Freq: Every day | ORAL | 1 refills | Status: DC

## 2019-11-13 ENCOUNTER — Telehealth: Payer: Self-pay

## 2019-11-13 NOTE — Telephone Encounter (Signed)
Confirmed appointment on 11/17/2019 and screened for covid. klh 

## 2019-11-17 ENCOUNTER — Other Ambulatory Visit: Payer: Self-pay

## 2019-11-17 ENCOUNTER — Ambulatory Visit: Payer: BC Managed Care – PPO | Admitting: Adult Health

## 2019-11-17 ENCOUNTER — Encounter: Payer: Self-pay | Admitting: Adult Health

## 2019-11-17 VITALS — BP 140/92 | HR 71 | Temp 97.4°F | Resp 16 | Ht 71.0 in | Wt 270.6 lb

## 2019-11-17 DIAGNOSIS — H9203 Otalgia, bilateral: Secondary | ICD-10-CM

## 2019-11-17 DIAGNOSIS — E039 Hypothyroidism, unspecified: Secondary | ICD-10-CM | POA: Diagnosis not present

## 2019-11-17 DIAGNOSIS — I10 Essential (primary) hypertension: Secondary | ICD-10-CM

## 2019-11-17 DIAGNOSIS — E1165 Type 2 diabetes mellitus with hyperglycemia: Secondary | ICD-10-CM

## 2019-11-17 DIAGNOSIS — E782 Mixed hyperlipidemia: Secondary | ICD-10-CM

## 2019-11-17 DIAGNOSIS — M1A09X0 Idiopathic chronic gout, multiple sites, without tophus (tophi): Secondary | ICD-10-CM

## 2019-11-17 DIAGNOSIS — Z6837 Body mass index (BMI) 37.0-37.9, adult: Secondary | ICD-10-CM

## 2019-11-17 DIAGNOSIS — H6123 Impacted cerumen, bilateral: Secondary | ICD-10-CM

## 2019-11-17 LAB — POCT GLYCOSYLATED HEMOGLOBIN (HGB A1C): Hemoglobin A1C: 7.4 % — AB (ref 4.0–5.6)

## 2019-11-17 MED ORDER — DEBROX 6.5 % OT SOLN: 5.0000 [drp] | Freq: Two times a day (BID) | OTIC | 0 refills | Status: DC

## 2019-11-17 MED ORDER — FARXIGA 10 MG PO TABS: 10.0000 mg | ORAL_TABLET | Freq: Every day | ORAL | 1 refills | Status: DC

## 2019-11-17 MED ORDER — LOSARTAN POTASSIUM 100 MG PO TABS: 100.0000 mg | ORAL_TABLET | Freq: Every day | ORAL | 2 refills | Status: DC

## 2019-11-17 MED ORDER — HYDROCHLOROTHIAZIDE 25 MG PO TABS: ORAL_TABLET | ORAL | 1 refills | Status: DC

## 2019-11-17 MED ORDER — LOSARTAN POTASSIUM 100 MG PO TABS: ORAL_TABLET | ORAL | 2 refills | Status: DC

## 2019-11-17 NOTE — Progress Notes (Signed)
[facility] [place], [place] [address]  Internal MEDICINE  Office Visit Note  Patient Name: Ronald Mata  [id number]  OE:[id number]  Date of Service: 11/17/2019  Chief Complaint  Patient presents with  . Follow-up  . Hypertension  . Ear Pain    HPI  Pt is here for follow up on HTN, Dm, hypothyroid, and ear pain.  His blood pressure is slightly elevated today, on recheck 140/92.  His A1C today is slightly improved from 7.8 to 7.4.  He is taking [place], watching his diet and attempting to exercise. He takes synthroid for his thyroid.  Denies any complaints.  Patient stoped rosuvastatin due to leg pain and cramps.  He immediately noticed a resolution within 3 days of stopping the medication.  The cramps and pain have not returned. We discussed zetia, or taking statin a couple days a week.  He is willing to do this.    Current Medication: Outpatient Encounter Medications as of 11/17/2019  Medication Sig  . cetirizine (ZYRTEC) 10 MG tablet Take 1 tablet (10 mg total) by mouth daily.  . ciprofloxacin-dexamethasone (CIPRODEX) OTIC suspension Place 4 drops into both ears 2 (two) times daily.  . dapagliflozin propanediol (FARXIGA) 10 MG TABS tablet Take 10 mg by mouth daily before breakfast.  . febuxostat (ULORIC) 40 MG tablet Take 1 tablet (40 mg total) by mouth daily.  . fluticasone (FLONASE) 50 MCG/ACT nasal spray Place 2 sprays into both nostrils daily.  Marland Kitchen glucose blood (ONETOUCH VERIO) test strip Blood sugar testing QAM - E11.65  . hydrochlorothiazide (HYDRODIURIL) 25 MG tablet TAKE 1 TABLET(25 MG) BY MOUTH DAILY  . ibuprofen (ADVIL) 800 MG tablet Take 1 tablet (800 mg total) by mouth every 8 (eight) hours as needed for moderate pain.  . Lancets (ONETOUCH ULTRASOFT) lancets Blood sugar testing daily - E11.65  . levothyroxine (SYNTHROID) 50 MCG tablet Take 1 tablet (50 mcg total) by mouth daily before breakfast.  . losartan (COZAAR) 100 MG tablet Take 1/2 tab po  daily  . [DISCONTINUED] Phendimetrazine Tartrate 105 MG CP24 Take 1 capsule (105 mg total) by mouth daily.  . [DISCONTINUED] rosuvastatin (CRESTOR) 10 MG tablet Take 1 tablet (10 mg total) by mouth daily.   No facility-administered encounter medications on file as of 11/17/2019.    Surgical History: Past Surgical History:  Procedure Laterality Date  . stomach wrap and achalasia      Medical History: Past Medical History:  Diagnosis Date  . Hypertension   . Thyroid disease     Family History: Family History  Problem Relation Age of Onset  . Diabetes Mother   . Hypertension Mother   . Hyperlipidemia Mother   . Stroke Mother   . Osteoarthritis Mother   . Diabetes Father   . Osteoarthritis Father   . Glaucoma Father     Social History   Socioeconomic History  . Marital status: Married    Spouse name: Not on file  . Number of children: Not on file  . Years of education: Not on file  . Highest education level: Not on file  Occupational History  . Not on file  Tobacco Use  . Smoking status: Never Smoker  . Smokeless tobacco: Never Used  Substance and Sexual Activity  . Alcohol use: No    Alcohol/week: 0.0 standard drinks  . Drug use: No  . Sexual activity: Not on file  Other Topics Concern  . Not on file  Social History Narrative  .  Not on file   Social Determinants of Health   Financial Resource Strain:   . Difficulty of Paying Living Expenses:   Food Insecurity:   . Worried About Charity fundraiser in the Last Year:   . Arboriculturist in the Last Year:   Transportation Needs:   . Film/video editor (Medical):   Marland Kitchen Lack of Transportation (Non-Medical):   Physical Activity:   . Days of Exercise per Week:   . Minutes of Exercise per Session:   Stress:   . Feeling of Stress :   Social Connections:   . Frequency of Communication with Friends and Family:   . Frequency of Social Gatherings with Friends and Family:   . Attends Religious Services:   .  Active Member of Clubs or Organizations:   . Attends Archivist Meetings:   Marland Kitchen Marital Status:   Intimate Partner Violence:   . Fear of Current or Ex-Partner:   . Emotionally Abused:   Marland Kitchen Physically Abused:   . Sexually Abused:       Review of Systems  Constitutional: Negative.  Negative for chills, fatigue and unexpected weight change.  HENT: Negative.  Negative for congestion, rhinorrhea, sneezing and sore throat.   Eyes: Negative for redness.  Respiratory: Negative.  Negative for cough, chest tightness and shortness of breath.   Cardiovascular: Negative.  Negative for chest pain and palpitations.  Gastrointestinal: Negative.  Negative for abdominal pain, constipation, diarrhea, nausea and vomiting.  Endocrine: Negative.   Genitourinary: Negative.  Negative for dysuria and frequency.  Musculoskeletal: Negative.  Negative for arthralgias, back pain, joint swelling and neck pain.  Skin: Negative.  Negative for rash.  Allergic/Immunologic: Negative.   Neurological: Negative.  Negative for tremors and numbness.  Hematological: Negative for adenopathy. Does not bruise/bleed easily.  Psychiatric/Behavioral: Negative.  Negative for behavioral problems, sleep disturbance and suicidal ideas. The patient is not nervous/anxious.     Vital Signs: BP (!) 141/98   Pulse 71   Temp (!) 97.4 F (36.3 C)   Resp 16   Ht 5\' 11"  (1.803 m)   Wt 270 lb 9.6 oz (122.7 kg)   SpO2 97%   BMI 37.74 kg/m    Physical Exam Vitals and nursing note reviewed.  Constitutional:      General: He is not in acute distress.    Appearance: He is well-developed. He is not diaphoretic.  HENT:     Head: Normocephalic and atraumatic.     Mouth/Throat:     Pharynx: No oropharyngeal exudate.  Eyes:     Pupils: Pupils are equal, round, and reactive to light.  Neck:     Thyroid: No thyromegaly.     Vascular: No JVD.     Trachea: No tracheal deviation.  Cardiovascular:     Rate and Rhythm: Normal  rate and regular rhythm.     Heart sounds: Normal heart sounds. No murmur. No friction rub. No gallop.   Pulmonary:     Effort: Pulmonary effort is normal. No respiratory distress.     Breath sounds: Normal breath sounds. No wheezing or rales.  Chest:     Chest wall: No tenderness.  Abdominal:     Palpations: Abdomen is soft.     Tenderness: There is no abdominal tenderness. There is no guarding.  Musculoskeletal:        General: Normal range of motion.     Cervical back: Normal range of motion and neck supple.  Lymphadenopathy:  Cervical: No cervical adenopathy.  Skin:    General: Skin is warm and dry.  Neurological:     Mental Status: He is alert and oriented to person, place, and time.     Cranial Nerves: No cranial nerve deficit.  Psychiatric:        Behavior: Behavior normal.        Thought Content: Thought content normal.        Judgment: Judgment normal.    Assessment/Plan: 1. Type 2 diabetes mellitus with hyperglycemia, unspecified whether long term insulin use (HCC) Slowly improving A1C, continue lifestyle modifications as discussed.  - POCT HgB A1C - dapagliflozin propanediol (FARXIGA) 10 MG TABS tablet; Take 10 mg by mouth daily before breakfast.  Dispense: 90 tablet; Refill: 1  2. Essential hypertension Increase Losartan to 100mg  daily, follow up at next visit.  Check bp daily.   - losartan (COZAAR) 100 MG tablet; Take 1 tab po daily  Dispense: 30 tablet; Refill: 2 - hydrochlorothiazide (HYDRODIURIL) 25 MG tablet; TAKE 1 TABLET(25 MG) BY MOUTH DAILY  Dispense: 90 tablet; Refill: 1  3. Mixed hyperlipidemia Continue current management. Patient will restart statin at 3 times weekly.   4. Acquired hypothyroidism Continue synthroid  5. Chronic gout of multiple sites, unspecified cause Continue present management.   6. BMI 37.0-37.9, adult comorbidity include HTN, HLD.  7. Ear pain, bilateral - Ear Lavage - Ambulatory referral to ENT  8. Impacted cerumen  of both ears Use Debrox until you can see ENT.  - carbamide peroxide (DEBROX) 6.5 % OTIC solution; Place 5 drops into both ears 2 (two) times daily.  Dispense: 15 mL; Refill: 0  General Counseling: Ronald Mata verbalizes understanding of the findings of todays visit and agrees with plan of treatment. I have discussed any further diagnostic evaluation that may be needed or ordered today. We also reviewed his medications today. he has been encouraged to call the office with any questions or concerns that should arise related to todays visit.    Orders Placed This Encounter  Procedures  . POCT HgB A1C    No orders of the defined types were placed in this encounter.   Time spent: 30 Minutes   This patient was seen by Orson Gear AGNP-C in Collaboration with Dr Lavera Guise as a part of collaborative care agreement     Kendell Bane AGNP-C Internal medicine

## 2019-11-25 DIAGNOSIS — H6123 Impacted cerumen, bilateral: Secondary | ICD-10-CM | POA: Diagnosis not present

## 2019-11-25 DIAGNOSIS — J301 Allergic rhinitis due to pollen: Secondary | ICD-10-CM | POA: Diagnosis not present

## 2019-11-25 DIAGNOSIS — H6063 Unspecified chronic otitis externa, bilateral: Secondary | ICD-10-CM | POA: Diagnosis not present

## 2019-11-25 DIAGNOSIS — R42 Dizziness and giddiness: Secondary | ICD-10-CM | POA: Diagnosis not present

## 2019-11-25 DIAGNOSIS — H6980 Other specified disorders of Eustachian tube, unspecified ear: Secondary | ICD-10-CM | POA: Diagnosis not present

## 2020-02-02 ENCOUNTER — Other Ambulatory Visit: Payer: Self-pay | Admitting: Adult Health

## 2020-02-02 DIAGNOSIS — I10 Essential (primary) hypertension: Secondary | ICD-10-CM

## 2020-02-12 ENCOUNTER — Other Ambulatory Visit: Payer: Self-pay

## 2020-02-12 DIAGNOSIS — I10 Essential (primary) hypertension: Secondary | ICD-10-CM

## 2020-02-12 MED ORDER — LOSARTAN POTASSIUM 100 MG PO TABS: 100.0000 mg | ORAL_TABLET | Freq: Every day | ORAL | 2 refills | Status: DC

## 2020-02-13 ENCOUNTER — Telehealth: Payer: Self-pay

## 2020-02-13 NOTE — Telephone Encounter (Signed)
Confirmed appointment on 02/16/2020 and screened for covid. klh ° °

## 2020-02-16 ENCOUNTER — Ambulatory Visit: Payer: BC Managed Care – PPO | Admitting: Adult Health

## 2020-02-23 ENCOUNTER — Telehealth: Payer: Self-pay

## 2020-02-23 NOTE — Telephone Encounter (Signed)
BILLED MISSED APPOINTMENT FEE 02/16/20. BETH

## 2020-03-03 ENCOUNTER — Other Ambulatory Visit: Payer: Self-pay

## 2020-03-03 DIAGNOSIS — I10 Essential (primary) hypertension: Secondary | ICD-10-CM

## 2020-03-03 MED ORDER — LOSARTAN POTASSIUM 100 MG PO TABS: 100.0000 mg | ORAL_TABLET | Freq: Every day | ORAL | 2 refills | Status: DC

## 2020-04-13 ENCOUNTER — Other Ambulatory Visit: Payer: Self-pay | Admitting: Adult Health

## 2020-04-13 DIAGNOSIS — J309 Allergic rhinitis, unspecified: Secondary | ICD-10-CM

## 2020-05-01 ENCOUNTER — Other Ambulatory Visit: Payer: Self-pay | Admitting: Adult Health

## 2020-05-01 DIAGNOSIS — M1A29X1 Drug-induced chronic gout, multiple sites, with tophus (tophi): Secondary | ICD-10-CM

## 2020-05-04 ENCOUNTER — Other Ambulatory Visit: Payer: Self-pay

## 2020-05-04 DIAGNOSIS — M1A29X1 Drug-induced chronic gout, multiple sites, with tophus (tophi): Secondary | ICD-10-CM

## 2020-05-04 MED ORDER — FEBUXOSTAT 40 MG PO TABS: 40.0000 mg | ORAL_TABLET | Freq: Every day | ORAL | 0 refills | Status: DC

## 2020-05-11 ENCOUNTER — Encounter: Payer: Self-pay | Admitting: Nurse Practitioner

## 2020-05-11 ENCOUNTER — Ambulatory Visit: Payer: BC Managed Care – PPO | Admitting: Nurse Practitioner

## 2020-05-11 ENCOUNTER — Other Ambulatory Visit: Payer: Self-pay

## 2020-05-11 VITALS — BP 151/99 | HR 89 | Temp 97.5°F | Resp 16 | Ht 71.0 in | Wt 270.0 lb

## 2020-05-11 DIAGNOSIS — E782 Mixed hyperlipidemia: Secondary | ICD-10-CM | POA: Diagnosis not present

## 2020-05-11 DIAGNOSIS — I10 Essential (primary) hypertension: Secondary | ICD-10-CM

## 2020-05-11 DIAGNOSIS — Z6837 Body mass index (BMI) 37.0-37.9, adult: Secondary | ICD-10-CM | POA: Diagnosis not present

## 2020-05-11 DIAGNOSIS — Z23 Encounter for immunization: Secondary | ICD-10-CM | POA: Diagnosis not present

## 2020-05-11 DIAGNOSIS — E1165 Type 2 diabetes mellitus with hyperglycemia: Secondary | ICD-10-CM | POA: Diagnosis not present

## 2020-05-11 DIAGNOSIS — Z125 Encounter for screening for malignant neoplasm of prostate: Secondary | ICD-10-CM

## 2020-05-11 LAB — POCT GLYCOSYLATED HEMOGLOBIN (HGB A1C): Hemoglobin A1C: 7.2 % — AB (ref 4.0–5.6)

## 2020-05-11 MED ORDER — ROSUVASTATIN CALCIUM 5 MG PO TABS: 5.0000 mg | ORAL_TABLET | Freq: Every day | ORAL | 1 refills | Status: DC

## 2020-05-11 MED ORDER — HYDROCHLOROTHIAZIDE 25 MG PO TABS: ORAL_TABLET | ORAL | 1 refills | Status: DC

## 2020-05-11 MED ORDER — LOSARTAN POTASSIUM 100 MG PO TABS: 100.0000 mg | ORAL_TABLET | Freq: Every day | ORAL | 1 refills | Status: DC

## 2020-05-11 NOTE — Progress Notes (Signed)
[facility] [place], [place] [address]  Internal MEDICINE  Office Visit Note  Patient Name: Ronald Mata  [id number]  [id number]  Date of Service: 05/30/2020  Chief Complaint  Patient presents with  . Follow-up    bp has been running higher than normal  . Diabetes  . controlled substance form    reviewed with PT  . Quality Metric Gaps    flu,tetnaus,covid,hep C  . Medication Refill  . Leg Pain    sharp pain through legs    The patient is here for follow up visit. Blood sugars continue to improve. He is taking [place] every day. His HgbA1c is 7.2 today, down from 7.6 at his most recent check. Blood pressure is mildly elevated today. He is currently taking only 50mg  losartan every day. He is due to have routine, fasting labs. His BMI is 37.66. he has brought biometric screening form from his employer. He must enter into medically managed program for weight loss in order for him to be exempt from increase in insurance premium due to his BMI. He will limit his calorie intake to 1500 to 200 caloiries per day and gradually incorporate exercise into his daily routine. He would also like to get his flu shot while in the office today.       Current Medication: Outpatient Encounter Medications as of 05/11/2020  Medication Sig  . carbamide peroxide (DEBROX) 6.5 % OTIC solution Place 5 drops into both ears 2 (two) times daily.  . cetirizine (ZYRTEC) 10 MG tablet Take 1 tablet (10 mg total) by mouth daily.  . ciprofloxacin-dexamethasone (CIPRODEX) OTIC suspension Place 4 drops into both ears 2 (two) times daily.  . dapagliflozin propanediol (FARXIGA) 10 MG TABS tablet Take 10 mg by mouth daily before breakfast.  . fluticasone (FLONASE) 50 MCG/ACT nasal spray SHAKE LIQUID AND USE 2 SPRAYS IN EACH NOSTRIL DAILY  . glucose blood (ONETOUCH VERIO) test strip Blood sugar testing QAM - E11.65  . hydrochlorothiazide (HYDRODIURIL) 25 MG tablet Take 1 tablet po QD  .  ibuprofen (ADVIL) 800 MG tablet Take 1 tablet (800 mg total) by mouth every 8 (eight) hours as needed for moderate pain.  . Lancets (ONETOUCH ULTRASOFT) lancets Blood sugar testing daily - E11.65  . levothyroxine (SYNTHROID) 50 MCG tablet Take 1 tablet (50 mcg total) by mouth daily before breakfast.  . losartan (COZAAR) 100 MG tablet Take 1 tablet (100 mg total) by mouth daily.  . [DISCONTINUED] febuxostat (ULORIC) 40 MG tablet Take 1 tablet (40 mg total) by mouth daily.  . [DISCONTINUED] hydrochlorothiazide (HYDRODIURIL) 25 MG tablet TAKE 1 TABLET(25 MG) BY MOUTH DAILY  . [DISCONTINUED] losartan (COZAAR) 100 MG tablet Take 1 tablet (100 mg total) by mouth daily.  . rosuvastatin (CRESTOR) 5 MG tablet Take 1 tablet (5 mg total) by mouth daily.   No facility-administered encounter medications on file as of 05/11/2020.    Surgical History: Past Surgical History:  Procedure Laterality Date  . stomach wrap and achalasia      Medical History: Past Medical History:  Diagnosis Date  . Diabetes mellitus without complication ([place])   . Hypertension   . Thyroid disease     Family History: Family History  Problem Relation Age of Onset  . Diabetes Mother   . Hypertension Mother   . Hyperlipidemia Mother   . Stroke Mother   . Osteoarthritis Mother   . Diabetes Father   . Osteoarthritis Father   . Glaucoma Father  Social History   Socioeconomic History  . Marital status: Married    Spouse name: Not on file  . Number of children: Not on file  . Years of education: Not on file  . Highest education level: Not on file  Occupational History  . Not on file  Tobacco Use  . Smoking status: Never Smoker  . Smokeless tobacco: Never Used  Vaping Use  . Vaping Use: Never used  Substance and Sexual Activity  . Alcohol use: No    Alcohol/week: 0.0 standard drinks  . Drug use: No  . Sexual activity: Not on file  Other Topics Concern  . Not on file  Social History Narrative  . Not on  file   Social Determinants of Health   Financial Resource Strain:   . Difficulty of Paying Living Expenses: Not on file  Food Insecurity:   . Worried About Charity fundraiser in the Last Year: Not on file  . Ran Out of Food in the Last Year: Not on file  Transportation Needs:   . Lack of Transportation (Medical): Not on file  . Lack of Transportation (Non-Medical): Not on file  Physical Activity:   . Days of Exercise per Week: Not on file  . Minutes of Exercise per Session: Not on file  Stress:   . Feeling of Stress : Not on file  Social Connections:   . Frequency of Communication with Friends and Family: Not on file  . Frequency of Social Gatherings with Friends and Family: Not on file  . Attends Religious Services: Not on file  . Active Member of Clubs or Organizations: Not on file  . Attends Archivist Meetings: Not on file  . Marital Status: Not on file  Intimate Partner Violence:   . Fear of Current or Ex-Partner: Not on file  . Emotionally Abused: Not on file  . Physically Abused: Not on file  . Sexually Abused: Not on file      Review of Systems  Constitutional: Negative for chills, fatigue and unexpected weight change.       [place] weight since most recent visit.   HENT: Negative for congestion, ear pain, rhinorrhea, sneezing and sore throat.   Respiratory: Negative for cough, chest tightness, shortness of breath and wheezing.   Cardiovascular: Negative for chest pain and palpitations.       Blood pressure slightly elevated today.   Gastrointestinal: Negative for abdominal pain, constipation, diarrhea, nausea and vomiting.  Endocrine: Negative for cold intolerance, heat intolerance, polydipsia and polyuria.       Blood sugars continue to improve some.   Musculoskeletal: Negative for arthralgias, back pain, joint swelling and neck pain.  Skin: Negative for rash.  Neurological: Positive for headaches. Negative for dizziness, tremors and numbness.   Hematological: Negative for adenopathy. Does not bruise/bleed easily.  Psychiatric/Behavioral: Negative for behavioral problems, sleep disturbance and suicidal ideas. The patient is not nervous/anxious.     Today's Vitals   05/11/20 1114  BP: (!) 151/99  Pulse: 89  Resp: 16  Temp: (!) 97.5 F (36.4 C)  SpO2: 98%  Weight: 270 lb (122.5 kg)  Height: 5\' 11"  (1.803 m)   Body mass index is 37.66 kg/m.  Physical Exam Vitals and nursing note reviewed.  Constitutional:      General: He is not in acute distress.    Appearance: Normal appearance. He is well-developed. He is obese. He is not diaphoretic.  HENT:     Head: Normocephalic and atraumatic.  Nose: Nose normal.     Mouth/Throat:     Pharynx: No oropharyngeal exudate.  Eyes:     Pupils: Pupils are equal, round, and reactive to light.  Neck:     Thyroid: No thyromegaly.     Vascular: No carotid bruit or JVD.     Trachea: No tracheal deviation.  Cardiovascular:     Rate and Rhythm: Normal rate and regular rhythm.     Heart sounds: Normal heart sounds. No murmur heard.  No friction rub. No gallop.   Pulmonary:     Effort: Pulmonary effort is normal. No respiratory distress.     Breath sounds: Normal breath sounds. No wheezing or rales.  Chest:     Chest wall: No tenderness.  Abdominal:     Palpations: Abdomen is soft.  Musculoskeletal:        General: Normal range of motion.     Cervical back: Normal range of motion and neck supple.  Lymphadenopathy:     Cervical: No cervical adenopathy.  Skin:    General: Skin is warm and dry.  Neurological:     General: No focal deficit present.     Mental Status: He is alert and oriented to person, place, and time.     Cranial Nerves: No cranial nerve deficit.  Psychiatric:        Mood and Affect: Mood normal.        Behavior: Behavior normal.        Thought Content: Thought content normal.        Judgment: Judgment normal.    Assessment/Plan: 1. Type 2 diabetes  mellitus with hyperglycemia, without long-term current use of insulin (HCC) - POCT HgB A1C 7.2, improved from 7.6 at his most recent check. Continue farixga 10mg  daily. Add back rosuvastatin 5mg  daily with dinner. Check routine, fasting labs. - rosuvastatin (CRESTOR) 5 MG tablet; Take 1 tablet (5 mg total) by mouth daily.  Dispense: 90 tablet; Refill: 1  2. Essential hypertension Increase losartan to full 100mg  tablet daily. Continue HCTZ 25mg  daily.  - losartan (COZAAR) 100 MG tablet; Take 1 tablet (100 mg total) by mouth daily.  Dispense: 90 tablet; Refill: 1 - hydrochlorothiazide (HYDRODIURIL) 25 MG tablet; Take 1 tablet po QD  Dispense: 90 tablet; Refill: 1  3. Mixed hyperlipidemia Add back rosuvastatin 5mg  daily with supper.  - rosuvastatin (CRESTOR) 5 MG tablet; Take 1 tablet (5 mg total) by mouth daily.  Dispense: 90 tablet; Refill: 1  4. Needs flu shot Flu vaccine administered today.  - Flu Vaccine MDCK QUAD PF  5. BMI 37.0-37.9, adult Discussed limiting calorie intake to 1500 to 2000 calories per day and incorporating exercise into daily routine. Will consider treatment with appetite suppressant if blood pressure improved. Biometric screening form was filled out and returned to the patient so he can be exempt from increased insurance premiums based on his BMI.   6. Screening for prostate cancer PSA ordered with routine, fasting labs.   General Counseling: Thi verbalizes understanding of the findings of todays visit and agrees with plan of treatment. I have discussed any further diagnostic evaluation that may be needed or ordered today. We also reviewed his medications today. he has been encouraged to call the office with any questions or concerns that should arise related to todays visit.  Obesity Counseling: Risk Assessment: An assessment of behavioral risk factors was made today and includes lack of exercise sedentary lifestyle, lack of portion control and poor dietary  habits.  Risk  Modification Advice: She was counseled on portion control guidelines. Restricting daily caloric intake to 1500 to 2000 calories per day. The detrimental long term effects of obesity on her health and ongoing poor compliance was also discussed with the patient.  Hypertension Counseling:   The following hypertensive lifestyle modification were recommended and discussed:  1. Limiting alcohol intake to less than 1 oz/day of ethanol:(24 oz of beer or 8 oz of wine or 2 oz of 100-proof whiskey). 2. Take baby ASA 81 mg daily. 3. Importance of regular aerobic exercise and losing weight. 4. Reduce dietary saturated fat and cholesterol intake for overall cardiovascular health. 5. Maintaining adequate dietary potassium, calcium, and magnesium intake. 6. Regular monitoring of the blood pressure. 7. Reduce sodium intake to less than 100 mmol/day (less than 2.3 gm of sodium or less than 6 gm of sodium choride)   This patient was seen by [place] with Dr Lavera Guise as a part of collaborative care agreement  Orders Placed This Encounter  Procedures  . Flu Vaccine MDCK QUAD PF  . POCT HgB A1C    Meds ordered this encounter  Medications  . losartan (COZAAR) 100 MG tablet    Sig: Take 1 tablet (100 mg total) by mouth daily.    Dispense:  90 tablet    Refill:  1    Please fill as 90 day prescription    Order Specific Question:   Supervising Provider    Answer:   Lavera Guise [place]  . hydrochlorothiazide (HYDRODIURIL) 25 MG tablet    Sig: Take 1 tablet po QD    Dispense:  90 tablet    Refill:  1    Please fill as 90 day prescription.    Order Specific Question:   Supervising Provider    Answer:   Lavera Guise [[id number]]  . rosuvastatin (CRESTOR) 5 MG tablet    Sig: Take 1 tablet (5 mg total) by mouth daily.    Dispense:  90 tablet    Refill:  1    Order Specific Question:   Supervising Provider    Answer:   Lavera Guise [[id number]]    Total time spent: 31  Minutes   Time spent includes review of chart, medications, test results, and follow up plan with the patient.      Dr Lavera Guise Internal medicine

## 2020-05-18 ENCOUNTER — Ambulatory Visit: Payer: BC Managed Care – PPO | Admitting: Hospice and Palliative Medicine

## 2020-05-21 ENCOUNTER — Other Ambulatory Visit: Payer: Self-pay

## 2020-05-21 DIAGNOSIS — M1A29X1 Drug-induced chronic gout, multiple sites, with tophus (tophi): Secondary | ICD-10-CM

## 2020-05-21 MED ORDER — FEBUXOSTAT 40 MG PO TABS: 40.0000 mg | ORAL_TABLET | Freq: Every day | ORAL | 0 refills | Status: DC

## 2020-05-30 DIAGNOSIS — Z23 Encounter for immunization: Secondary | ICD-10-CM | POA: Insufficient documentation

## 2020-05-30 DIAGNOSIS — Z125 Encounter for screening for malignant neoplasm of prostate: Secondary | ICD-10-CM | POA: Insufficient documentation

## 2020-06-07 DIAGNOSIS — H6123 Impacted cerumen, bilateral: Secondary | ICD-10-CM | POA: Diagnosis not present

## 2020-06-07 DIAGNOSIS — H6063 Unspecified chronic otitis externa, bilateral: Secondary | ICD-10-CM | POA: Diagnosis not present

## 2020-06-14 ENCOUNTER — Other Ambulatory Visit: Payer: Self-pay

## 2020-06-14 DIAGNOSIS — J309 Allergic rhinitis, unspecified: Secondary | ICD-10-CM

## 2020-06-14 DIAGNOSIS — E039 Hypothyroidism, unspecified: Secondary | ICD-10-CM

## 2020-06-14 MED ORDER — LEVOTHYROXINE SODIUM 50 MCG PO TABS: 50.0000 ug | ORAL_TABLET | Freq: Every day | ORAL | 2 refills | Status: DC

## 2020-06-14 MED ORDER — CETIRIZINE HCL 10 MG PO TABS: 10.0000 mg | ORAL_TABLET | Freq: Every day | ORAL | 2 refills | Status: DC

## 2020-07-09 ENCOUNTER — Other Ambulatory Visit: Payer: Self-pay

## 2020-07-09 DIAGNOSIS — E1165 Type 2 diabetes mellitus with hyperglycemia: Secondary | ICD-10-CM

## 2020-07-09 MED ORDER — ONETOUCH VERIO VI STRP: ORAL_STRIP | 12 refills | Status: DC

## 2020-08-12 ENCOUNTER — Ambulatory Visit: Payer: BC Managed Care – PPO | Admitting: Hospice and Palliative Medicine

## 2020-08-13 DIAGNOSIS — Z20822 Contact with and (suspected) exposure to covid-19: Secondary | ICD-10-CM | POA: Diagnosis not present

## 2020-08-14 ENCOUNTER — Other Ambulatory Visit: Payer: Self-pay | Admitting: Adult Health

## 2020-08-14 DIAGNOSIS — E1165 Type 2 diabetes mellitus with hyperglycemia: Secondary | ICD-10-CM

## 2020-08-24 ENCOUNTER — Ambulatory Visit: Payer: BC Managed Care – PPO | Admitting: Hospice and Palliative Medicine

## 2020-08-27 ENCOUNTER — Other Ambulatory Visit: Payer: Self-pay

## 2020-08-27 ENCOUNTER — Ambulatory Visit: Payer: BC Managed Care – PPO | Admitting: Internal Medicine

## 2020-08-27 ENCOUNTER — Encounter: Payer: Self-pay | Admitting: Physician Assistant

## 2020-08-27 VITALS — BP 136/88 | HR 81 | Temp 97.6°F | Resp 16 | Ht 71.0 in | Wt 270.4 lb

## 2020-08-27 DIAGNOSIS — E1165 Type 2 diabetes mellitus with hyperglycemia: Secondary | ICD-10-CM

## 2020-08-27 DIAGNOSIS — M1A29X1 Drug-induced chronic gout, multiple sites, with tophus (tophi): Secondary | ICD-10-CM

## 2020-08-27 DIAGNOSIS — J301 Allergic rhinitis due to pollen: Secondary | ICD-10-CM

## 2020-08-27 DIAGNOSIS — E782 Mixed hyperlipidemia: Secondary | ICD-10-CM

## 2020-08-27 DIAGNOSIS — I10 Essential (primary) hypertension: Secondary | ICD-10-CM | POA: Diagnosis not present

## 2020-08-27 DIAGNOSIS — M1A0720 Idiopathic chronic gout, left ankle and foot, without tophus (tophi): Secondary | ICD-10-CM

## 2020-08-27 DIAGNOSIS — Z125 Encounter for screening for malignant neoplasm of prostate: Secondary | ICD-10-CM

## 2020-08-27 DIAGNOSIS — Z1211 Encounter for screening for malignant neoplasm of colon: Secondary | ICD-10-CM

## 2020-08-27 DIAGNOSIS — K220 Achalasia of cardia: Secondary | ICD-10-CM

## 2020-08-27 DIAGNOSIS — E039 Hypothyroidism, unspecified: Secondary | ICD-10-CM

## 2020-08-27 LAB — POCT GLYCOSYLATED HEMOGLOBIN (HGB A1C): Hemoglobin A1C: 7.5 % — AB (ref 4.0–5.6)

## 2020-08-27 MED ORDER — FEBUXOSTAT 40 MG PO TABS: 40.0000 mg | ORAL_TABLET | Freq: Every day | ORAL | 1 refills | Status: DC

## 2020-08-27 MED ORDER — DAPAGLIFLOZIN PROPANEDIOL 10 MG PO TABS: 10.0000 mg | ORAL_TABLET | Freq: Every day | ORAL | 1 refills | Status: DC

## 2020-08-27 MED ORDER — MONTELUKAST SODIUM 10 MG PO TABS: ORAL_TABLET | ORAL | 1 refills | Status: DC

## 2020-08-27 NOTE — Progress Notes (Signed)
[facility] [place], [place] [address]  Internal MEDICINE  Office Visit Note  Patient Name: Ronald Mata  [id number]  [id number]  Date of Service: 09/02/2020  Chief Complaint  Patient presents with  . Follow-up  . Diabetes  . Hypertension    HPI Pt is here for routine follow up C/O allergies and nasal congestion. Diabetes is improved, recent hg a1c is 7.5, slightly worse than previous visit. Takes Farxiga 10 mg, but has been out.  BP is under good control.  Has not had a flare up with gout, on maintainence therapy with Uloric( might need caution with HCTZ)   Other medical problems include hypothyroidism and HLD both controlled with meds  Previous LS spine shows the following in 2019 Severe disc space narrowing at L4-5, stable. Facet osteoarthritic change noted at L5-S1. No fracture or spondylolisthesis. No colonoscopy on file  UGI in 2009 showed Dilated esophagus with distal tapering with relative narrowing which does not permit the passage of a 12.5 mm barium pill. The narrowing is smooth without evidence of ulceration or mass. Recommend further evaluation with upper endoscopy.( Achalasia ) seen by [facility]   Current Medication: Outpatient Encounter Medications as of 08/27/2020  Medication Sig  . montelukast (SINGULAIR) 10 MG tablet Take one tab po qd for sinus congestion  . carbamide peroxide (DEBROX) 6.5 % OTIC solution Place 5 drops into both ears 2 (two) times daily.  . cetirizine (ZYRTEC) 10 MG tablet Take 1 tablet (10 mg total) by mouth daily.  . ciprofloxacin-dexamethasone (CIPRODEX) OTIC suspension Place 4 drops into both ears 2 (two) times daily.  . dapagliflozin propanediol (FARXIGA) 10 MG TABS tablet Take 1 tablet (10 mg total) by mouth daily before breakfast.  . febuxostat (ULORIC) 40 MG tablet Take 1 tablet (40 mg total) by mouth daily.  . fluticasone (FLONASE) 50 MCG/ACT nasal spray SHAKE LIQUID AND USE 2 SPRAYS IN EACH NOSTRIL DAILY  .  glucose blood (ONETOUCH VERIO) test strip Blood sugar testing QAM - E11.65  . hydrochlorothiazide (HYDRODIURIL) 25 MG tablet Take 1 tablet po QD  . Lancets (ONETOUCH ULTRASOFT) lancets Blood sugar testing daily - E11.65  . levothyroxine (SYNTHROID) 50 MCG tablet Take 1 tablet (50 mcg total) by mouth daily before breakfast.  . losartan (COZAAR) 100 MG tablet Take 1 tablet (100 mg total) by mouth daily.  . rosuvastatin (CRESTOR) 5 MG tablet Take 1 tablet (5 mg total) by mouth daily.  . [DISCONTINUED] dapagliflozin propanediol (FARXIGA) 10 MG TABS tablet Take 10 mg by mouth daily before breakfast.  . [DISCONTINUED] febuxostat (ULORIC) 40 MG tablet Take 1 tablet (40 mg total) by mouth daily.  . [DISCONTINUED] ibuprofen (ADVIL) 800 MG tablet Take 1 tablet (800 mg total) by mouth every 8 (eight) hours as needed for moderate pain.   No facility-administered encounter medications on file as of 08/27/2020.    Surgical History: Past Surgical History:  Procedure Laterality Date  . stomach wrap and achalasia      Medical History: Past Medical History:  Diagnosis Date  . Diabetes mellitus without complication ([place])   . Hypertension   . Thyroid disease     Family History: Family History  Problem Relation Age of Onset  . Diabetes Mother   . Hypertension Mother   . Hyperlipidemia Mother   . Stroke Mother   . Osteoarthritis Mother   . Diabetes Father   . Osteoarthritis Father   . Glaucoma Father     Social History  Socioeconomic History  . Marital status: Married    Spouse name: Not on file  . Number of children: Not on file  . Years of education: Not on file  . Highest education level: Not on file  Occupational History  . Not on file  Tobacco Use  . Smoking status: Never Smoker  . Smokeless tobacco: Never Used  Vaping Use  . Vaping Use: Never used  Substance and Sexual Activity  . Alcohol use: No    Alcohol/week: 0.0 standard drinks  . Drug use: No  . Sexual activity: Not on  file  Other Topics Concern  . Not on file  Social History Narrative  . Not on file   Social Determinants of Health   Financial Resource Strain: Not on file  Food Insecurity: Not on file  Transportation Needs: Not on file  Physical Activity: Not on file  Stress: Not on file  Social Connections: Not on file  Intimate Partner Violence: Not on file      Review of Systems  Vital Signs: BP 136/88   Pulse 81   Temp 97.6 F (36.4 C)   Resp 16   Ht 5\' 11"  (1.803 m)   Wt 270 lb 6.4 oz (122.7 kg)   SpO2 98%   BMI 37.71 kg/m    Physical Exam Constitutional:      General: He is not in acute distress.    Appearance: He is well-developed. He is not diaphoretic.  HENT:     Head: Normocephalic and atraumatic.     Mouth/Throat:     Pharynx: No oropharyngeal exudate.  Eyes:     Pupils: Pupils are equal, round, and reactive to light.  Neck:     Thyroid: No thyromegaly.     Vascular: No JVD.     Trachea: No tracheal deviation.  Cardiovascular:     Rate and Rhythm: Normal rate and regular rhythm.     Heart sounds: Normal heart sounds. No murmur heard. No friction rub. No gallop.   Pulmonary:     Effort: Pulmonary effort is normal. No respiratory distress.     Breath sounds: No wheezing or rales.  Chest:     Chest wall: No tenderness.  Abdominal:     General: Bowel sounds are normal.     Palpations: Abdomen is soft.  Musculoskeletal:        General: Normal range of motion.     Cervical back: Normal range of motion and neck supple.  Lymphadenopathy:     Cervical: No cervical adenopathy.  Skin:    General: Skin is warm and dry.  Neurological:     Mental Status: He is alert and oriented to person, place, and time.     Cranial Nerves: No cranial nerve deficit.  Psychiatric:        Behavior: Behavior normal.        Thought Content: Thought content normal.        Judgment: Judgment normal.        Assessment/Plan: 1. Type 2 diabetes mellitus with hyperglycemia,  without long-term current use of insulin (HCC) Will continue Farxiga 10 mg qd, will need to modify diet, lose wt.  - POCT HgB A1C - Lipid Panel With LDL/HDL Ratio - Comprehensive metabolic panel - Urinalysis - dapagliflozin propanediol (FARXIGA) 10 MG TABS tablet; Take 1 tablet (10 mg total) by mouth daily before breakfast.  Dispense: 90 tablet; Refill: 1  2. Essential hypertension controlled with meds.  - CBC with Differential/Platelet - Comprehensive  metabolic panel  3. Mixed hyperlipidemia Continue Crestor as before  - Lipid Panel With LDL/HDL Ratio - Comprehensive metabolic panel  4. Special screening for malignant neoplasm of prostate - PSA  5. Chronic idiopathic gout involving toe of left foot without tophus No recent flare ups, will continue Uloric as before  - Comprehensive metabolic panel - febuxostat (ULORIC) 40 MG tablet; Take 1 tablet (40 mg total) by mouth daily.  Dispense: 90 tablet; Refill: 1  6. Achalasia of esophagus Pt had UGI, was diagnosed Achalasia.   7. Screening for colon cancer Need to have screening colonoscopy  - CBC with Differential/Platelet - Ambulatory referral to Gastroenterology  8. Non-seasonal allergic rhinitis due to pollen Continue Flonase OTC, add  - montelukast (SINGULAIR) 10 MG tablet; Take one tab po qd for sinus congestion  Dispense: 90 tablet; Refill: 1  9. Hypothyroidism, unspecified type Continue Synthroid  - TSH - T4, free  General Counseling: Javone verbalizes understanding of the findings of todays visit and agrees with plan of treatment. I have discussed any further diagnostic evaluation that may be needed or ordered today. We also reviewed his medications today. he has been encouraged to call the office with any questions or concerns that should arise related to todays visit.    Orders Placed This Encounter  Procedures  . CBC with Differential/Platelet  . Lipid Panel With LDL/HDL Ratio  . TSH  . T4, free  .  Comprehensive metabolic panel  . Urinalysis  . PSA  . POCT HgB A1C    Meds ordered this encounter  Medications  . dapagliflozin propanediol (FARXIGA) 10 MG TABS tablet    Sig: Take 1 tablet (10 mg total) by mouth daily before breakfast.    Dispense:  90 tablet    Refill:  1    Patient given samples along with $0 copay card for this prescription.  . febuxostat (ULORIC) 40 MG tablet    Sig: Take 1 tablet (40 mg total) by mouth daily.    Dispense:  90 tablet    Refill:  1    Pt needs appt for future refills  . montelukast (SINGULAIR) 10 MG tablet    Sig: Take one tab po qd for sinus congestion    Dispense:  90 tablet    Refill:  1    Total time spent:30 Minutes Time spent includes review of chart, medications, test results, and follow up plan with the patient. . Dr Lavera Guise Internal medicine

## 2020-09-16 ENCOUNTER — Other Ambulatory Visit: Payer: Self-pay

## 2020-09-16 ENCOUNTER — Telehealth (INDEPENDENT_AMBULATORY_CARE_PROVIDER_SITE_OTHER): Payer: Self-pay | Admitting: Gastroenterology

## 2020-09-16 DIAGNOSIS — Z1211 Encounter for screening for malignant neoplasm of colon: Secondary | ICD-10-CM

## 2020-09-16 MED ORDER — NA SULFATE-K SULFATE-MG SULF 17.5-3.13-1.6 GM/177ML PO SOLN: 354.0000 mL | Freq: Once | ORAL | 0 refills | Status: AC

## 2020-09-16 NOTE — Progress Notes (Signed)
Gastroenterology Pre-Procedure Review  Request Date: 10/11/2020 Requesting Physician: Dr. Allen Norris   PATIENT REVIEW QUESTIONS: The patient responded to the following health history questions as indicated:    1. Are you having any GI issues? no 2. Do you have a personal history of Polyps? no 3. Do you have a family history of Colon Cancer or Polyps? no 4. Diabetes Mellitus? yes 5. Joint replacements in the past 12 months?no 6. Major health problems in the past 3 months?no 7. Any artificial heart valves, MVP, or defibrillator?no    MEDICATIONS & ALLERGIES:    Patient reports the following regarding taking any anticoagulation/antiplatelet therapy:   Plavix, Coumadin, Eliquis, Xarelto, Lovenox, Pradaxa, Brilinta, or Effient? no Aspirin? no  Patient confirms/reports the following medications:  Current Outpatient Medications  Medication Sig Dispense Refill  . cetirizine (ZYRTEC) 10 MG tablet Take 1 tablet (10 mg total) by mouth daily. 90 tablet 2  . dapagliflozin propanediol (FARXIGA) 10 MG TABS tablet Take 1 tablet (10 mg total) by mouth daily before breakfast. 90 tablet 1  . febuxostat (ULORIC) 40 MG tablet Take 1 tablet (40 mg total) by mouth daily. 90 tablet 1  . fluticasone (FLONASE) 50 MCG/ACT nasal spray SHAKE LIQUID AND USE 2 SPRAYS IN EACH NOSTRIL DAILY 16 g 6  . glucose blood (ONETOUCH VERIO) test strip Blood sugar testing QAM - E11.65 100 each 12  . hydrochlorothiazide (HYDRODIURIL) 25 MG tablet Take 1 tablet po QD 90 tablet 1  . Lancets (ONETOUCH ULTRASOFT) lancets Blood sugar testing daily - E11.65 100 each 12  . levothyroxine (SYNTHROID) 50 MCG tablet Take 1 tablet (50 mcg total) by mouth daily before breakfast. 90 tablet 2  . losartan (COZAAR) 100 MG tablet Take 1 tablet (100 mg total) by mouth daily. 90 tablet 1  . montelukast (SINGULAIR) 10 MG tablet Take one tab po qd for sinus congestion 90 tablet 1  . rosuvastatin (CRESTOR) 5 MG tablet Take 1 tablet (5 mg total) by mouth  daily. 90 tablet 1   No current facility-administered medications for this visit.    Patient confirms/reports the following allergies:  No Known Allergies  No orders of the defined types were placed in this encounter.   AUTHORIZATION INFORMATION Primary Insurance: 1D#: Group #:  Secondary Insurance: 1D#: Group #:  SCHEDULE INFORMATION: Date:  Time: Location:

## 2020-10-06 ENCOUNTER — Encounter: Payer: Self-pay | Admitting: Gastroenterology

## 2020-10-13 ENCOUNTER — Other Ambulatory Visit
Admission: RE | Admit: 2020-10-13 | Discharge: 2020-10-13 | Disposition: A | Discharge status: Home / Self Care | Payer: BC Managed Care – PPO | Source: Ambulatory Visit | Attending: Gastroenterology | Admitting: Gastroenterology

## 2020-10-13 ENCOUNTER — Other Ambulatory Visit: Payer: Self-pay

## 2020-10-13 DIAGNOSIS — Z8249 Family history of ischemic heart disease and other diseases of the circulatory system: Secondary | ICD-10-CM | POA: Diagnosis not present

## 2020-10-13 DIAGNOSIS — Z823 Family history of stroke: Secondary | ICD-10-CM | POA: Diagnosis not present

## 2020-10-13 DIAGNOSIS — Z01812 Encounter for preprocedural laboratory examination: Secondary | ICD-10-CM | POA: Insufficient documentation

## 2020-10-13 DIAGNOSIS — Z20822 Contact with and (suspected) exposure to covid-19: Secondary | ICD-10-CM | POA: Insufficient documentation

## 2020-10-13 DIAGNOSIS — D125 Benign neoplasm of sigmoid colon: Secondary | ICD-10-CM | POA: Diagnosis not present

## 2020-10-13 DIAGNOSIS — Z833 Family history of diabetes mellitus: Secondary | ICD-10-CM | POA: Diagnosis not present

## 2020-10-13 DIAGNOSIS — Z1211 Encounter for screening for malignant neoplasm of colon: Secondary | ICD-10-CM | POA: Diagnosis not present

## 2020-10-13 DIAGNOSIS — K5730 Diverticulosis of large intestine without perforation or abscess without bleeding: Secondary | ICD-10-CM | POA: Diagnosis not present

## 2020-10-13 DIAGNOSIS — Z7984 Long term (current) use of oral hypoglycemic drugs: Secondary | ICD-10-CM | POA: Diagnosis not present

## 2020-10-13 DIAGNOSIS — E119 Type 2 diabetes mellitus without complications: Secondary | ICD-10-CM | POA: Diagnosis not present

## 2020-10-13 DIAGNOSIS — D122 Benign neoplasm of ascending colon: Secondary | ICD-10-CM | POA: Diagnosis not present

## 2020-10-13 DIAGNOSIS — Z79899 Other long term (current) drug therapy: Secondary | ICD-10-CM | POA: Diagnosis not present

## 2020-10-13 DIAGNOSIS — Z8349 Family history of other endocrine, nutritional and metabolic diseases: Secondary | ICD-10-CM | POA: Diagnosis not present

## 2020-10-13 DIAGNOSIS — Z83511 Family history of glaucoma: Secondary | ICD-10-CM | POA: Diagnosis not present

## 2020-10-13 LAB — SARS CORONAVIRUS 2 (TAT 6-24 HRS): SARS Coronavirus 2: NEGATIVE

## 2020-10-14 NOTE — Discharge Instructions (Signed)

## 2020-10-15 ENCOUNTER — Ambulatory Visit: Payer: BC Managed Care – PPO | Admitting: Anesthesiology

## 2020-10-15 ENCOUNTER — Other Ambulatory Visit: Payer: Self-pay

## 2020-10-15 ENCOUNTER — Encounter
Admission: RE | Disposition: A | Discharge status: Home / Self Care | Payer: Self-pay | Source: Home / Self Care | Attending: Gastroenterology

## 2020-10-15 ENCOUNTER — Encounter: Payer: Self-pay | Admitting: Gastroenterology

## 2020-10-15 ENCOUNTER — Ambulatory Visit
Admission: RE | Admit: 2020-10-15 | Discharge: 2020-10-15 | Disposition: A | Discharge status: Home / Self Care | Payer: BC Managed Care – PPO | Attending: Gastroenterology | Admitting: Gastroenterology

## 2020-10-15 DIAGNOSIS — Z83511 Family history of glaucoma: Secondary | ICD-10-CM | POA: Insufficient documentation

## 2020-10-15 DIAGNOSIS — E119 Type 2 diabetes mellitus without complications: Secondary | ICD-10-CM | POA: Diagnosis not present

## 2020-10-15 DIAGNOSIS — Z8249 Family history of ischemic heart disease and other diseases of the circulatory system: Secondary | ICD-10-CM | POA: Insufficient documentation

## 2020-10-15 DIAGNOSIS — K5730 Diverticulosis of large intestine without perforation or abscess without bleeding: Secondary | ICD-10-CM | POA: Insufficient documentation

## 2020-10-15 DIAGNOSIS — Z833 Family history of diabetes mellitus: Secondary | ICD-10-CM | POA: Insufficient documentation

## 2020-10-15 DIAGNOSIS — Z20822 Contact with and (suspected) exposure to covid-19: Secondary | ICD-10-CM | POA: Diagnosis not present

## 2020-10-15 DIAGNOSIS — Z8349 Family history of other endocrine, nutritional and metabolic diseases: Secondary | ICD-10-CM | POA: Insufficient documentation

## 2020-10-15 DIAGNOSIS — Z79899 Other long term (current) drug therapy: Secondary | ICD-10-CM | POA: Diagnosis not present

## 2020-10-15 DIAGNOSIS — Z823 Family history of stroke: Secondary | ICD-10-CM | POA: Diagnosis not present

## 2020-10-15 DIAGNOSIS — Z7984 Long term (current) use of oral hypoglycemic drugs: Secondary | ICD-10-CM | POA: Insufficient documentation

## 2020-10-15 DIAGNOSIS — D122 Benign neoplasm of ascending colon: Secondary | ICD-10-CM | POA: Insufficient documentation

## 2020-10-15 DIAGNOSIS — D125 Benign neoplasm of sigmoid colon: Secondary | ICD-10-CM | POA: Insufficient documentation

## 2020-10-15 DIAGNOSIS — K635 Polyp of colon: Secondary | ICD-10-CM

## 2020-10-15 DIAGNOSIS — Z1211 Encounter for screening for malignant neoplasm of colon: Secondary | ICD-10-CM

## 2020-10-15 HISTORY — PX: POLYPECTOMY: SHX5525

## 2020-10-15 HISTORY — DX: Unspecified osteoarthritis, unspecified site: M19.90

## 2020-10-15 HISTORY — PX: COLONOSCOPY WITH PROPOFOL: SHX5780

## 2020-10-15 HISTORY — DX: Cardiac murmur, unspecified: R01.1

## 2020-10-15 LAB — GLUCOSE, CAPILLARY: Glucose-Capillary: 123 mg/dL — ABNORMAL HIGH (ref 70–99)

## 2020-10-15 SURGERY — COLONOSCOPY WITH PROPOFOL
Anesthesia: General

## 2020-10-15 MED ORDER — STERILE WATER FOR IRRIGATION IR SOLN
Status: DC | PRN
  Administered 2020-10-15: 150 mL

## 2020-10-15 MED ORDER — PROPOFOL 10 MG/ML IV BOLUS
INTRAVENOUS | Status: DC | PRN
  Administered 2020-10-15: 40 mg via INTRAVENOUS
  Administered 2020-10-15 (×3): 30 mg via INTRAVENOUS
  Administered 2020-10-15: 40 mg via INTRAVENOUS
  Administered 2020-10-15: 30 mg via INTRAVENOUS
  Administered 2020-10-15: 120 mg via INTRAVENOUS
  Administered 2020-10-15 (×2): 30 mg via INTRAVENOUS

## 2020-10-15 MED ORDER — LACTATED RINGERS IV SOLN: INTRAVENOUS | Status: DC

## 2020-10-15 MED ORDER — LIDOCAINE HCL (CARDIAC) PF 100 MG/5ML IV SOSY
PREFILLED_SYRINGE | INTRAVENOUS | Status: DC | PRN
  Administered 2020-10-15: 50 mg via INTRAVENOUS

## 2020-10-15 MED ORDER — OXYCODONE HCL 5 MG/5ML PO SOLN: 5.0000 mg | Freq: Once | ORAL | Status: DC | PRN

## 2020-10-15 MED ORDER — OXYCODONE HCL 5 MG PO TABS: 5.0000 mg | ORAL_TABLET | Freq: Once | ORAL | Status: DC | PRN

## 2020-10-15 SURGICAL SUPPLY — 22 items
CLIP HMST 235XBRD CATH ROT (MISCELLANEOUS) IMPLANT
CLIP RESOLUTION 360 11X235 (MISCELLANEOUS)
ELECT REM PT RETURN 9FT ADLT (ELECTROSURGICAL)
ELECTRODE REM PT RTRN 9FT ADLT (ELECTROSURGICAL) IMPLANT
FORCEPS BIOP RAD 4 LRG CAP 4 (CUTTING FORCEPS) IMPLANT
GOWN CVR UNV OPN BCK APRN NK (MISCELLANEOUS) ×4 IMPLANT
GOWN ISOL THUMB LOOP REG UNIV (MISCELLANEOUS) ×6
INJECTOR VARIJECT VIN23 (MISCELLANEOUS) IMPLANT
KIT DEFENDO VALVE AND CONN (KITS) IMPLANT
KIT PRC NS LF DISP ENDO (KITS) ×2 IMPLANT
KIT PROCEDURE OLYMPUS (KITS) ×3
MANIFOLD NEPTUNE II (INSTRUMENTS) ×3 IMPLANT
MARKER SPOT ENDO TATTOO 5ML (MISCELLANEOUS) IMPLANT
PROBE APC STR FIRE (PROBE) IMPLANT
RETRIEVER NET ROTH 2.5X230 LF (MISCELLANEOUS) IMPLANT
SNARE COLD EXACTO (MISCELLANEOUS) ×3 IMPLANT
SNARE SHORT THROW 13M SML OVAL (MISCELLANEOUS) IMPLANT
SNARE SNG USE RND 15MM (INSTRUMENTS) IMPLANT
SPOT EX ENDOSCOPIC TATTOO (MISCELLANEOUS)
TRAP ETRAP POLY (MISCELLANEOUS) ×3 IMPLANT
VARIJECT INJECTOR VIN23 (MISCELLANEOUS)
WATER STERILE IRR 250ML POUR (IV SOLUTION) ×3 IMPLANT

## 2020-10-15 NOTE — Anesthesia Postprocedure Evaluation (Signed)
Anesthesia Post Note  Patient: Ronald Mata  Procedure(s) Performed: COLONOSCOPY WITH PROPOFOL (N/A ) POLYPECTOMY     Patient location during evaluation: PACU Anesthesia Type: General Level of consciousness: awake and alert Pain management: pain level controlled Vital Signs Assessment: post-procedure vital signs reviewed and stable Respiratory status: spontaneous breathing, nonlabored ventilation, respiratory function stable and patient connected to nasal cannula oxygen Cardiovascular status: blood pressure returned to baseline and stable Postop Assessment: no apparent nausea or vomiting Anesthetic complications: no   No complications documented.  Fidel Levy

## 2020-10-15 NOTE — H&P (Signed)
Ronald Lame, MD [address] [place], [place] [address] Phone: [phone] Fax : [phone]  Primary Care Physician:  Lavera Guise, MD Primary Gastroenterologist:  Dr. Allen Norris  Pre-Procedure History & Physical: HPI:  Ronald Mata is a 50 y.o. male is here for a screening colonoscopy.   Past Medical History:  Diagnosis Date  . Arthritis    feet  . Diabetes mellitus without complication ([place])   . Heart murmur   . Hypertension   . Thyroid disease     Past Surgical History:  Procedure Laterality Date  . CARDIAC CATHETERIZATION     15 years ago no stents  . stomach wrap and achalasia      Prior to Admission medications   Medication Sig Start Date End Date Taking? Authorizing Provider  cetirizine (ZYRTEC) 10 MG tablet Take 1 tablet (10 mg total) by mouth daily. 06/14/20  Yes Boscia, Greer Ee, NP  co-enzyme Q-10 30 MG capsule Take 30 mg by mouth daily.   Yes [provider]  dapagliflozin propanediol (FARXIGA) 10 MG TABS tablet Take 1 tablet (10 mg total) by mouth daily before breakfast. 08/27/20  Yes Lavera Guise, MD  ELDERBERRY PO Take by mouth daily.   Yes [provider]  febuxostat (ULORIC) 40 MG tablet Take 1 tablet (40 mg total) by mouth daily. 08/27/20  Yes Lavera Guise, MD  fluticasone [facility]) 50 MCG/ACT nasal spray SHAKE LIQUID AND USE 2 SPRAYS IN [facility] NOSTRIL DAILY 04/13/20  Yes Kendell Bane, NP  glucose blood (ONETOUCH VERIO) test strip Blood sugar testing QAM - E11.65 07/09/20  Yes Ronnell Freshwater, NP  hydrochlorothiazide (HYDRODIURIL) 25 MG tablet Take 1 tablet po QD 05/11/20  Yes Ronnell Freshwater, NP  Lancets (ONETOUCH ULTRASOFT) lancets Blood sugar testing daily - E11.65 05/20/19  Yes Ronnell Freshwater, NP  levothyroxine (SYNTHROID) 50 MCG tablet Take 1 tablet (50 mcg total) by mouth daily before breakfast. 06/14/20  Yes Boscia, Heather E, NP  losartan (COZAAR) 100 MG tablet Take 1 tablet (100 mg total) by mouth daily.  05/11/20  Yes Ronnell Freshwater, NP  montelukast (SINGULAIR) 10 MG tablet Take one tab po qd for sinus congestion 08/27/20  Yes Lavera Guise, MD  rosuvastatin (CRESTOR) 5 MG tablet Take 1 tablet (5 mg total) by mouth daily. 05/11/20  Yes Ronnell Freshwater, NP    Allergies as of 09/16/2020  . (No Known Allergies)    Family History  Problem Relation Age of Onset  . Diabetes Mother   . Hypertension Mother   . Hyperlipidemia Mother   . Stroke Mother   . Osteoarthritis Mother   . Diabetes Father   . Osteoarthritis Father   . Glaucoma Father     Social History   Socioeconomic History  . Marital status: Married    Spouse name: Not on file  . Number of children: Not on file  . Years of education: Not on file  . Highest education level: Not on file  Occupational History  . Not on file  Tobacco Use  . Smoking status: Never Smoker  . Smokeless tobacco: Never Used  Vaping Use  . Vaping Use: Never used  Substance and Sexual Activity  . Alcohol use: No    Alcohol/week: 0.0 standard drinks  . Drug use: No  . Sexual activity: Not on file  Other Topics Concern  . Not on file  Social History Narrative  . Not on file  Social Determinants of Health   Financial Resource Strain: Not on file  Food Insecurity: Not on file  Transportation Needs: Not on file  Physical Activity: Not on file  Stress: Not on file  Social Connections: Not on file  Intimate Partner Violence: Not on file    Review of Systems: See HPI, otherwise negative ROS  Physical Exam: BP (!) 146/96   Pulse 72   Temp (!) 97.5 F (36.4 C) (Temporal)   Ht 5\' 11"  (1.803 m)   Wt 121.6 kg   SpO2 98%   BMI 37.38 kg/m  General:   Alert,  pleasant and cooperative in NAD Head:  Normocephalic and atraumatic. Neck:  Supple; no masses or thyromegaly. Lungs:  Clear throughout to auscultation.    Heart:  Regular rate and rhythm. Abdomen:  Soft, nontender and nondistended. Normal bowel sounds, without guarding, and  without rebound.   Neurologic:  Alert and  oriented x4;  grossly normal neurologically.  Impression/Plan: [name] is now here to undergo a screening colonoscopy.  Risks, benefits, and alternatives regarding colonoscopy have been reviewed with the patient.  Questions have been answered.  All parties agreeable.

## 2020-10-15 NOTE — Op Note (Signed)
[facility] Gastroenterology Patient Name: Ronald Mata Procedure Date: 10/15/2020 9:19 AM MRN: [id number] Account #: [id number] Date of Birth: [birth year] Admit Type: Outpatient Age: 50 Room: [facility] OR ROOM 01 Gender: Male Note Status: Finalized Procedure:             Colonoscopy Indications:           Screening for colorectal malignant neoplasm Providers:             [name] MD, MD Referring MD:          Lavera Guise, MD (Referring MD) Medicines:             Propofol per Anesthesia Complications:         No immediate complications. Procedure:             Pre-Anesthesia Assessment:                        - Prior to the procedure, a History and Physical was                         performed, and patient medications and allergies were                         reviewed. The patient's tolerance of previous                         anesthesia was also reviewed. The risks and benefits                         of the procedure and the sedation options and risks                         were discussed with the patient. All questions were                         answered, and informed consent was obtained. Prior                         Anticoagulants: The patient has taken no previous                         anticoagulant or antiplatelet agents. ASA Grade                         Assessment: II - A patient with mild systemic disease.                         After reviewing the risks and benefits, the patient                         was deemed in satisfactory condition to undergo the                         procedure.                        After obtaining informed consent, the colonoscope was  passed under direct vision. Throughout the procedure,                         the patient's blood pressure, pulse, and oxygen                         saturations were monitored continuously. The                         Colonoscope was introduced through the anus  and                         advanced to the the cecum, identified by appendiceal                         orifice and ileocecal valve. The colonoscopy was                         performed without difficulty. The patient tolerated                         the procedure well. The quality of the bowel                         preparation was excellent. Findings:      The perianal and digital rectal examinations were normal.      A 5 mm polyp was found in the ascending colon. The polyp was sessile.       The polyp was removed with a cold snare. Resection and retrieval were       complete.      Three sessile polyps were found in the sigmoid colon. The polyps were 3       to 4 mm in size. These polyps were removed with a cold snare. Resection       and retrieval were complete.      A few small-mouthed diverticula were found in the entire colon. Impression:            - One 5 mm polyp in the ascending colon, removed with                         a cold snare. Resected and retrieved.                        - Three 3 to 4 mm polyps in the sigmoid colon, removed                         with a cold snare. Resected and retrieved.                        - Diverticulosis in the entire examined colon. Recommendation:        - Discharge patient to home.                        - Resume previous diet.                        - Continue present medications.                        -  Await pathology results.                        - Repeat colonoscopy in 5 years if polyp adenoma and                         10 years if hyperplastic Procedure Code(s):     --- Professional ---                        [phone], Colonoscopy, flexible; with removal of                         tumor(s), polyp(s), or other lesion(s) by snare                         technique Diagnosis Code(s):     --- Professional ---                        Z12.11, Encounter for screening for malignant neoplasm                         of colon                         K63.5, Polyp of colon CPT copyright 2019 [employer]. All rights reserved. The codes documented in this report are preliminary and upon coder review may  be revised to meet current compliance requirements. [name] MD, MD 10/15/2020 9:49:44 AM This report has been signed electronically. Number of Addenda: 0 Note Initiated On: 10/15/2020 9:19 AM Scope Withdrawal Time: 0 hours 9 minutes 13 seconds  Total Procedure Duration: 0 hours 11 minutes 57 seconds  Estimated Blood Loss:  Estimated blood loss: none. Estimated blood loss: none.      [facility]

## 2020-10-15 NOTE — Anesthesia Procedure Notes (Signed)
Date/Time: 10/15/2020 9:30 AM Performed by: Cameron Ali, CRNA Pre-anesthesia Checklist: Patient identified, Emergency Drugs available, Suction available, Timeout performed and Patient being monitored Patient Re-evaluated:Patient Re-evaluated prior to induction Oxygen Delivery Method: Nasal cannula Placement Confirmation: positive ETCO2

## 2020-10-15 NOTE — Transfer of Care (Signed)
Immediate Anesthesia Transfer of Care Note  Patient: Ronald Mata  Procedure(s) Performed: COLONOSCOPY WITH PROPOFOL (N/A ) POLYPECTOMY  Patient Location: PACU  Anesthesia Type: General  Level of Consciousness: awake, alert  and patient cooperative  Airway and Oxygen Therapy: Patient Spontanous Breathing and Patient connected to supplemental oxygen  Post-op Assessment: Post-op Vital signs reviewed, Patient's Cardiovascular Status Stable, Respiratory Function Stable, Patent Airway and No signs of Nausea or vomiting  Post-op Vital Signs: Reviewed and stable  Complications: No complications documented.

## 2020-10-15 NOTE — Anesthesia Preprocedure Evaluation (Signed)
Anesthesia Evaluation  Patient identified by MRN, date of birth, ID band Patient awake    Reviewed: NPO status   History of Anesthesia Complications Negative for: history of anesthetic complications  Airway Mallampati: II  TM Distance: >3 FB Neck ROM: full    Dental no notable dental hx.    Pulmonary neg pulmonary ROS,    Pulmonary exam normal        Cardiovascular Exercise Tolerance: Good hypertension, Normal cardiovascular exam     Neuro/Psych negative neurological ROS  negative psych ROS   GI/Hepatic negative GI ROS, Neg liver ROS,   Endo/Other  diabetesHypothyroidism Morbid obesity (bmi 37)  Renal/GU negative Renal ROS  negative genitourinary   Musculoskeletal  (+) Arthritis , gout   Abdominal   Peds  Hematology negative hematology ROS (+)   Anesthesia Other Findings   Reproductive/Obstetrics                             Anesthesia Physical Anesthesia Plan  ASA: II  Anesthesia Plan: General   Post-op Pain Management:    Induction:   PONV Risk Score and Plan: 2 and Propofol infusion and TIVA  Airway Management Planned:   Additional Equipment:   Intra-op Plan:   Post-operative Plan:   Informed Consent: I have reviewed the patients History and Physical, chart, labs and discussed the procedure including the risks, benefits and alternatives for the proposed anesthesia with the patient or authorized representative who has indicated his/her understanding and acceptance.       Plan Discussed with: CRNA  Anesthesia Plan Comments:         Anesthesia Quick Evaluation

## 2020-10-18 ENCOUNTER — Encounter: Payer: Self-pay | Admitting: Gastroenterology

## 2020-10-18 LAB — SURGICAL PATHOLOGY

## 2020-10-19 ENCOUNTER — Encounter: Payer: Self-pay | Admitting: Gastroenterology

## 2020-11-06 ENCOUNTER — Other Ambulatory Visit: Payer: Self-pay | Admitting: Nurse Practitioner

## 2020-11-06 DIAGNOSIS — E1165 Type 2 diabetes mellitus with hyperglycemia: Secondary | ICD-10-CM

## 2020-11-06 DIAGNOSIS — E782 Mixed hyperlipidemia: Secondary | ICD-10-CM

## 2020-11-06 DIAGNOSIS — I10 Essential (primary) hypertension: Secondary | ICD-10-CM

## 2020-12-03 ENCOUNTER — Other Ambulatory Visit: Payer: Self-pay

## 2020-12-03 DIAGNOSIS — I10 Essential (primary) hypertension: Secondary | ICD-10-CM

## 2020-12-03 MED ORDER — LOSARTAN POTASSIUM 100 MG PO TABS: 100.0000 mg | ORAL_TABLET | Freq: Every day | ORAL | 1 refills | Status: DC

## 2020-12-03 MED ORDER — HYDROCHLOROTHIAZIDE 25 MG PO TABS: ORAL_TABLET | ORAL | 1 refills | Status: DC

## 2020-12-17 ENCOUNTER — Other Ambulatory Visit: Payer: Self-pay | Admitting: Adult Health

## 2020-12-17 DIAGNOSIS — J309 Allergic rhinitis, unspecified: Secondary | ICD-10-CM

## 2020-12-30 DIAGNOSIS — H6123 Impacted cerumen, bilateral: Secondary | ICD-10-CM | POA: Diagnosis not present

## 2020-12-30 DIAGNOSIS — J301 Allergic rhinitis due to pollen: Secondary | ICD-10-CM | POA: Diagnosis not present

## 2021-01-04 DIAGNOSIS — J301 Allergic rhinitis due to pollen: Secondary | ICD-10-CM | POA: Diagnosis not present

## 2021-01-04 DIAGNOSIS — I10 Essential (primary) hypertension: Secondary | ICD-10-CM | POA: Diagnosis not present

## 2021-01-04 DIAGNOSIS — Z125 Encounter for screening for malignant neoplasm of prostate: Secondary | ICD-10-CM | POA: Diagnosis not present

## 2021-01-04 DIAGNOSIS — E1165 Type 2 diabetes mellitus with hyperglycemia: Secondary | ICD-10-CM | POA: Diagnosis not present

## 2021-01-04 DIAGNOSIS — E782 Mixed hyperlipidemia: Secondary | ICD-10-CM | POA: Diagnosis not present

## 2021-01-05 LAB — CBC WITH DIFFERENTIAL/PLATELET
Basophils Absolute: 0 10*3/uL (ref 0.0–0.2)
Basos: 1 %
EOS (ABSOLUTE): 0.1 10*3/uL (ref 0.0–0.4)
Eos: 1 %
Hematocrit: 46.3 % (ref 37.5–51.0)
Hemoglobin: 15.5 g/dL (ref 13.0–17.7)
Immature Grans (Abs): 0 10*3/uL (ref 0.0–0.1)
Immature Granulocytes: 0 %
Lymphocytes Absolute: 1.9 10*3/uL (ref 0.7–3.1)
Lymphs: 53 %
MCH: 26.6 pg (ref 26.6–33.0)
MCHC: 33.5 g/dL (ref 31.5–35.7)
MCV: 79 fL (ref 79–97)
Monocytes Absolute: 0.3 10*3/uL (ref 0.1–0.9)
Monocytes: 9 %
Neutrophils Absolute: 1.4 10*3/uL (ref 1.4–7.0)
Neutrophils: 36 %
Platelets: 244 10*3/uL (ref 150–450)
RBC: 5.83 x10E6/uL — ABNORMAL HIGH (ref 4.14–5.80)
RDW: 13.5 % (ref 11.6–15.4)
WBC: 3.7 10*3/uL (ref 3.4–10.8)

## 2021-01-05 LAB — COMPREHENSIVE METABOLIC PANEL
ALT: 47 IU/L — ABNORMAL HIGH (ref 0–44)
AST: 41 IU/L — ABNORMAL HIGH (ref 0–40)
Albumin/Globulin Ratio: 1.9 (ref 1.2–2.2)
Albumin: 4.7 g/dL (ref 4.0–5.0)
Alkaline Phosphatase: 64 IU/L (ref 44–121)
BUN/Creatinine Ratio: 13 (ref 9–20)
BUN: 14 mg/dL (ref 6–24)
Bilirubin Total: 0.3 mg/dL (ref 0.0–1.2)
CO2: 26 mmol/L (ref 20–29)
Calcium: 10.3 mg/dL — ABNORMAL HIGH (ref 8.7–10.2)
Chloride: 99 mmol/L (ref 96–106)
Creatinine, Ser: 1.1 mg/dL (ref 0.76–1.27)
Globulin, Total: 2.5 g/dL (ref 1.5–4.5)
Glucose: 120 mg/dL — ABNORMAL HIGH (ref 65–99)
Potassium: 4.3 mmol/L (ref 3.5–5.2)
Sodium: 140 mmol/L (ref 134–144)
Total Protein: 7.2 g/dL (ref 6.0–8.5)
eGFR: 83 mL/min/{1.73_m2} (ref >59)

## 2021-01-05 LAB — LIPID PANEL WITH LDL/HDL RATIO
Cholesterol, Total: 227 mg/dL — ABNORMAL HIGH (ref 100–199)
HDL: 40 mg/dL (ref >39)
LDL Chol Calc (NIH): 163 mg/dL — ABNORMAL HIGH (ref 0–99)
LDL/HDL Ratio: 4.1 ratio — ABNORMAL HIGH (ref 0.0–3.6)
Triglycerides: 132 mg/dL (ref 0–149)
VLDL Cholesterol Cal: 24 mg/dL (ref 5–40)

## 2021-01-05 LAB — PSA: Prostate Specific Ag, Serum: 0.6 ng/mL (ref 0.0–4.0)

## 2021-01-05 LAB — T4, FREE: Free T4: 1.46 ng/dL (ref 0.82–1.77)

## 2021-01-05 LAB — TSH: TSH: 1.65 u[IU]/mL (ref 0.450–4.500)

## 2021-01-26 ENCOUNTER — Other Ambulatory Visit: Payer: Self-pay | Admitting: Nurse Practitioner

## 2021-01-26 DIAGNOSIS — E1165 Type 2 diabetes mellitus with hyperglycemia: Secondary | ICD-10-CM

## 2021-02-15 ENCOUNTER — Other Ambulatory Visit: Payer: Self-pay | Admitting: Internal Medicine

## 2021-02-15 ENCOUNTER — Other Ambulatory Visit: Payer: Self-pay

## 2021-02-15 DIAGNOSIS — E1165 Type 2 diabetes mellitus with hyperglycemia: Secondary | ICD-10-CM

## 2021-02-15 DIAGNOSIS — E039 Hypothyroidism, unspecified: Secondary | ICD-10-CM

## 2021-02-15 DIAGNOSIS — J309 Allergic rhinitis, unspecified: Secondary | ICD-10-CM

## 2021-02-15 DIAGNOSIS — M1A0720 Idiopathic chronic gout, left ankle and foot, without tophus (tophi): Secondary | ICD-10-CM

## 2021-02-15 DIAGNOSIS — E782 Mixed hyperlipidemia: Secondary | ICD-10-CM

## 2021-02-15 DIAGNOSIS — I10 Essential (primary) hypertension: Secondary | ICD-10-CM

## 2021-02-15 MED ORDER — ONETOUCH VERIO VI STRP: ORAL_STRIP | 12 refills | Status: DC

## 2021-02-15 MED ORDER — CETIRIZINE HCL 10 MG PO TABS: 10.0000 mg | ORAL_TABLET | Freq: Every day | ORAL | 0 refills | Status: DC

## 2021-02-15 MED ORDER — DAPAGLIFLOZIN PROPANEDIOL 10 MG PO TABS: 10.0000 mg | ORAL_TABLET | Freq: Every day | ORAL | 0 refills | Status: DC

## 2021-02-15 MED ORDER — HYDROCHLOROTHIAZIDE 25 MG PO TABS: ORAL_TABLET | ORAL | 0 refills | Status: DC

## 2021-02-15 MED ORDER — FEBUXOSTAT 40 MG PO TABS: 40.0000 mg | ORAL_TABLET | Freq: Every day | ORAL | 0 refills | Status: DC

## 2021-02-15 MED ORDER — LOSARTAN POTASSIUM 100 MG PO TABS: 100.0000 mg | ORAL_TABLET | Freq: Every day | ORAL | 0 refills | Status: DC

## 2021-02-15 MED ORDER — ONETOUCH ULTRASOFT LANCETS MISC: 12 refills | Status: DC

## 2021-02-15 MED ORDER — ROSUVASTATIN CALCIUM 5 MG PO TABS: 5.0000 mg | ORAL_TABLET | Freq: Every day | ORAL | 0 refills | Status: DC

## 2021-02-15 MED ORDER — LEVOTHYROXINE SODIUM 50 MCG PO TABS: 50.0000 ug | ORAL_TABLET | Freq: Every day | ORAL | 0 refills | Status: DC

## 2021-02-15 MED ORDER — FLUTICASONE PROPIONATE 50 MCG/ACT NA SUSP: NASAL | 0 refills | Status: DC

## 2021-03-14 ENCOUNTER — Ambulatory Visit (INDEPENDENT_AMBULATORY_CARE_PROVIDER_SITE_OTHER): Payer: BC Managed Care – PPO | Admitting: Nurse Practitioner

## 2021-03-14 ENCOUNTER — Encounter: Payer: Self-pay | Admitting: Nurse Practitioner

## 2021-03-14 ENCOUNTER — Other Ambulatory Visit: Payer: Self-pay

## 2021-03-14 VITALS — BP 145/92 | HR 67 | Temp 97.1°F | Resp 16 | Ht 71.0 in | Wt 268.4 lb

## 2021-03-14 DIAGNOSIS — M1A0720 Idiopathic chronic gout, left ankle and foot, without tophus (tophi): Secondary | ICD-10-CM

## 2021-03-14 DIAGNOSIS — R42 Dizziness and giddiness: Secondary | ICD-10-CM

## 2021-03-14 DIAGNOSIS — G8929 Other chronic pain: Secondary | ICD-10-CM

## 2021-03-14 DIAGNOSIS — D751 Secondary polycythemia: Secondary | ICD-10-CM

## 2021-03-14 DIAGNOSIS — R300 Dysuria: Secondary | ICD-10-CM | POA: Diagnosis not present

## 2021-03-14 DIAGNOSIS — E559 Vitamin D deficiency, unspecified: Secondary | ICD-10-CM

## 2021-03-14 DIAGNOSIS — Z23 Encounter for immunization: Secondary | ICD-10-CM

## 2021-03-14 DIAGNOSIS — J301 Allergic rhinitis due to pollen: Secondary | ICD-10-CM

## 2021-03-14 DIAGNOSIS — E1165 Type 2 diabetes mellitus with hyperglycemia: Secondary | ICD-10-CM

## 2021-03-14 DIAGNOSIS — E782 Mixed hyperlipidemia: Secondary | ICD-10-CM

## 2021-03-14 DIAGNOSIS — I10 Essential (primary) hypertension: Secondary | ICD-10-CM | POA: Diagnosis not present

## 2021-03-14 DIAGNOSIS — Z0001 Encounter for general adult medical examination with abnormal findings: Secondary | ICD-10-CM

## 2021-03-14 DIAGNOSIS — M545 Low back pain, unspecified: Secondary | ICD-10-CM

## 2021-03-14 DIAGNOSIS — E039 Hypothyroidism, unspecified: Secondary | ICD-10-CM

## 2021-03-14 DIAGNOSIS — J309 Allergic rhinitis, unspecified: Secondary | ICD-10-CM

## 2021-03-14 DIAGNOSIS — M62830 Muscle spasm of back: Secondary | ICD-10-CM

## 2021-03-14 LAB — POCT GLYCOSYLATED HEMOGLOBIN (HGB A1C): Hemoglobin A1C: 7.8 % — AB (ref 4.0–5.6)

## 2021-03-14 MED ORDER — ONETOUCH ULTRASOFT LANCETS MISC: 12 refills | Status: DC

## 2021-03-14 MED ORDER — TETANUS-DIPHTH-ACELL PERTUSSIS 5-2.5-18.5 LF-MCG/0.5 IM SUSP: 0.5000 mL | Freq: Once | INTRAMUSCULAR | 0 refills | Status: AC

## 2021-03-14 MED ORDER — LOSARTAN POTASSIUM 100 MG PO TABS: 100.0000 mg | ORAL_TABLET | Freq: Every day | ORAL | 0 refills | Status: DC

## 2021-03-14 MED ORDER — MONTELUKAST SODIUM 10 MG PO TABS: ORAL_TABLET | ORAL | 1 refills | Status: DC

## 2021-03-14 MED ORDER — DAPAGLIFLOZIN PROPANEDIOL 10 MG PO TABS: 10.0000 mg | ORAL_TABLET | Freq: Every day | ORAL | 0 refills | Status: DC

## 2021-03-14 MED ORDER — ONETOUCH VERIO VI STRP: ORAL_STRIP | 12 refills | Status: DC

## 2021-03-14 MED ORDER — CETIRIZINE HCL 10 MG PO TABS: 10.0000 mg | ORAL_TABLET | Freq: Every day | ORAL | 0 refills | Status: DC

## 2021-03-14 MED ORDER — LEVOTHYROXINE SODIUM 50 MCG PO TABS: 50.0000 ug | ORAL_TABLET | Freq: Every day | ORAL | 0 refills | Status: DC

## 2021-03-14 MED ORDER — FEBUXOSTAT 40 MG PO TABS: 40.0000 mg | ORAL_TABLET | Freq: Every day | ORAL | 0 refills | Status: DC

## 2021-03-14 MED ORDER — RYBELSUS 7 MG PO TABS: 7.0000 mg | ORAL_TABLET | Freq: Every day | ORAL | 2 refills | Status: DC

## 2021-03-14 MED ORDER — AMLODIPINE BESYLATE 5 MG PO TABS: 5.0000 mg | ORAL_TABLET | Freq: Every day | ORAL | 0 refills | Status: DC

## 2021-03-14 MED ORDER — ROSUVASTATIN CALCIUM 5 MG PO TABS
5.0000 mg | ORAL_TABLET | Freq: Every day | ORAL | 0 refills | Status: DC
Start: 2021-03-14 — End: 2021-05-16

## 2021-03-14 MED ORDER — IBUPROFEN 800 MG PO TABS: 800.0000 mg | ORAL_TABLET | Freq: Three times a day (TID) | ORAL | 0 refills | Status: DC | PRN

## 2021-03-14 MED ORDER — FLUTICASONE PROPIONATE 50 MCG/ACT NA SUSP: NASAL | 3 refills | Status: DC

## 2021-03-14 MED ORDER — TIZANIDINE HCL 4 MG PO TABS: 4.0000 mg | ORAL_TABLET | Freq: Every evening | ORAL | 0 refills | Status: DC | PRN

## 2021-03-14 MED ORDER — HYDROCHLOROTHIAZIDE 25 MG PO TABS: ORAL_TABLET | ORAL | 0 refills | Status: DC

## 2021-03-14 NOTE — Progress Notes (Signed)
[facility] [place], [place] [address]  Internal MEDICINE  Office Visit Note  Patient Name: Ronald Mata  [id number]  [id number]  Date of Service: 03/14/2021  Chief Complaint  Patient presents with   Annual Exam    Discuss BP, light headedness and dizziness recently, discuss weight loss, refills   Diabetes   Hypertension    HPI Ronald Mata presents for an annual well visit and physical exam. he has a history of arthritis, diabetes, hypertension, hypothyroidism, and a heart murmur. He works full time at [employer]. He has had 2 doses of the COVID vaccine and 1 booster dose. He had his screening colonoscopy done in march this year and he has a screening colonoscopy done every 5 years. He had 4 polyps removed during his last colonoscopy.  He had labs done in June this year, discussed results with patient today.  ---PSA was normal. TSH and free T4 was normal. Metabolic panel showed mild hypercalcemia. CBC showed mild erythrocytosis. Total cholesterol and LDL are elevated.  ---He has low back pain and muscle spasms in his lower back. He would like something to help with this problem especially at night.  ---Reports lightheadedness and dizziness. Noticed it recently, not sure when it started but has been a little worse. Denies vertigo.  ---BP remains elevated after rechecking it, see vitals.     Current Medication: Outpatient Encounter Medications as of 03/14/2021  Medication Sig   amLODipine (NORVASC) 5 MG tablet Take 1 tablet (5 mg total) by mouth daily.   co-enzyme Q-10 30 MG capsule Take 30 mg by mouth daily.   ELDERBERRY PO Take by mouth daily.   ibuprofen (ADVIL) 800 MG tablet Take 1 tablet (800 mg total) by mouth every 8 (eight) hours as needed.   [START ON 04/04/2021] Semaglutide (RYBELSUS) 7 MG TABS Take 7 mg by mouth daily.   tiZANidine (ZANAFLEX) 4 MG tablet Take 1 tablet (4 mg total) by mouth at bedtime as needed for muscle spasms.   [DISCONTINUED] cetirizine  (ZYRTEC) 10 MG tablet Take 1 tablet (10 mg total) by mouth daily.   [DISCONTINUED] dapagliflozin propanediol (FARXIGA) 10 MG TABS tablet Take 1 tablet (10 mg total) by mouth daily before breakfast.   [DISCONTINUED] febuxostat (ULORIC) 40 MG tablet Take 1 tablet (40 mg total) by mouth daily.   [DISCONTINUED] fluticasone (FLONASE) 50 MCG/ACT nasal spray USE 2 SPRAYS IN EACH NOSTRIL DAILY, SHAKE LIQUID   [DISCONTINUED] glucose blood (ONETOUCH VERIO) test strip Blood sugar testing QAM - E11.65   [DISCONTINUED] hydrochlorothiazide (HYDRODIURIL) 25 MG tablet Take 1 tablet po QD   [DISCONTINUED] Lancets (ONETOUCH ULTRASOFT) lancets Blood sugar testing daily - E11.65   [DISCONTINUED] levothyroxine (SYNTHROID) 50 MCG tablet Take 1 tablet (50 mcg total) by mouth daily before breakfast.   [DISCONTINUED] losartan (COZAAR) 100 MG tablet Take 1 tablet (100 mg total) by mouth daily.   [DISCONTINUED] montelukast (SINGULAIR) 10 MG tablet Take one tab po qd for sinus congestion   [DISCONTINUED] rosuvastatin (CRESTOR) 5 MG tablet Take 1 tablet (5 mg total) by mouth daily.   [DISCONTINUED] Tdap (BOOSTRIX) 5-2.5-18.5 LF-MCG/0.5 injection Inject 0.5 mLs into the muscle once.   cetirizine (ZYRTEC) 10 MG tablet Take 1 tablet (10 mg total) by mouth daily.   dapagliflozin propanediol (FARXIGA) 10 MG TABS tablet Take 1 tablet (10 mg total) by mouth daily before breakfast.   febuxostat (ULORIC) 40 MG tablet Take 1 tablet (40 mg total) by mouth daily.   fluticasone (FLONASE) 50 MCG/ACT  nasal spray USE 2 SPRAYS IN EACH NOSTRIL DAILY, SHAKE LIQUID   glucose blood (ONETOUCH VERIO) test strip Blood sugar testing QAM - E11.65   hydrochlorothiazide (HYDRODIURIL) 25 MG tablet Take 1 tablet po QD   Lancets (ONETOUCH ULTRASOFT) lancets Blood sugar testing daily - E11.65   levothyroxine (SYNTHROID) 50 MCG tablet Take 1 tablet (50 mcg total) by mouth daily before breakfast.   losartan (COZAAR) 100 MG tablet Take 1 tablet (100 mg total)  by mouth daily.   montelukast (SINGULAIR) 10 MG tablet Take one tab po qd for sinus congestion   rosuvastatin (CRESTOR) 5 MG tablet Take 1 tablet (5 mg total) by mouth daily.   [EXPIRED] Tdap (BOOSTRIX) 5-2.5-18.5 LF-MCG/0.5 injection Inject 0.5 mLs into the muscle once for 1 dose.   No facility-administered encounter medications on file as of 03/14/2021.    Surgical History: Past Surgical History:  Procedure Laterality Date   CARDIAC CATHETERIZATION     15 years ago no stents   COLONOSCOPY WITH PROPOFOL N/A 10/15/2020   Procedure: COLONOSCOPY WITH PROPOFOL;  Surgeon: [name], MD;  Location: [facility];  Service: Endoscopy;  Laterality: N/A;  Diabetic   POLYPECTOMY  10/15/2020   Procedure: POLYPECTOMY;  Surgeon: [name], MD;  Location: [facility];  Service: Endoscopy;;   stomach wrap and achalasia      Medical History: Past Medical History:  Diagnosis Date   Arthritis    feet   Diabetes mellitus without complication ([place])    Heart murmur    Hypertension    Thyroid disease     Family History: Family History  Problem Relation Age of Onset   Diabetes Mother    Hypertension Mother    Hyperlipidemia Mother    Stroke Mother    Osteoarthritis Mother    Diabetes Father    Osteoarthritis Father    Glaucoma Father     Social History   Socioeconomic History   Marital status: Married    Spouse name: Not on file   Number of children: Not on file   Years of education: Not on file   Highest education level: Not on file  Occupational History   Not on file  Tobacco Use   Smoking status: Never   Smokeless tobacco: Never  Vaping Use   Vaping Use: Never used  Substance and Sexual Activity   Alcohol use: No    Alcohol/week: 0.0 standard drinks   Drug use: No   Sexual activity: Not on file  Other Topics Concern   Not on file  Social History Narrative   Not on file   Social Determinants of Health   Financial Resource Strain: Not on file  Food  Insecurity: Not on file  Transportation Needs: Not on file  Physical Activity: Not on file  Stress: Not on file  Social Connections: Not on file  Intimate Partner Violence: Not on file      Review of Systems  Constitutional:  Negative for activity change, appetite change, chills, fatigue, fever and unexpected weight change.  HENT: Negative.  Negative for congestion, ear pain, rhinorrhea, sore throat and trouble swallowing.   Eyes: Negative.   Respiratory: Negative.  Negative for cough, chest tightness, shortness of breath and wheezing.   Cardiovascular: Negative.  Negative for chest pain.  Gastrointestinal: Negative.  Negative for abdominal pain, blood in stool, constipation, diarrhea, nausea and vomiting.  Endocrine: Negative.   Genitourinary: Negative.  Negative for difficulty urinating, dysuria, frequency, hematuria and urgency.  Musculoskeletal: Negative.  Negative for arthralgias, back pain, joint swelling, myalgias and neck pain.  Skin: Negative.  Negative for rash and wound.  Allergic/Immunologic: Negative.  Negative for immunocompromised state.  Neurological:  Positive for dizziness and light-headedness. Negative for seizures, numbness and headaches.  Hematological: Negative.   Psychiatric/Behavioral: Negative.  Negative for behavioral problems, self-injury and suicidal ideas. The patient is not nervous/anxious.    Vital Signs: BP (!) 145/92 Comment: 150/110  Pulse 67   Temp (!) 97.1 F (36.2 C)   Resp 16   Ht '5\' 11"'$  (1.803 m)   Wt 268 lb 6.4 oz (121.7 kg)   SpO2 97%   BMI 37.43 kg/m    Physical Exam Vitals reviewed.  Constitutional:      General: He is awake. He is not in acute distress.    Appearance: Normal appearance. He is well-developed and well-groomed. He is obese. He is not ill-appearing or diaphoretic.  HENT:     Head: Normocephalic and atraumatic.     Right Ear: Tympanic membrane, ear canal and external ear normal.     Left Ear: Tympanic membrane, ear  canal and external ear normal.     Nose: Nose normal. No congestion or rhinorrhea.     Mouth/Throat:     Lips: Pink.     Mouth: Mucous membranes are moist.     Pharynx: Oropharynx is clear. Uvula midline. No oropharyngeal exudate or posterior oropharyngeal erythema.  Eyes:     General: Lids are normal. Vision grossly intact. Gaze aligned appropriately. No scleral icterus.       Right eye: No discharge.        Left eye: No discharge.     Extraocular Movements: Extraocular movements intact.     Conjunctiva/sclera: Conjunctivae normal.     Pupils: Pupils are equal, round, and reactive to light.     Funduscopic exam:    Right eye: Red reflex present.        Left eye: Red reflex present. Neck:     Thyroid: No thyromegaly.     Vascular: No JVD.     Trachea: No tracheal deviation.  Cardiovascular:     Rate and Rhythm: Normal rate and regular rhythm.     Pulses:          Carotid pulses are 3+ on the right side and 3+ on the left side.      Radial pulses are 2+ on the right side and 2+ on the left side.       Dorsalis pedis pulses are 2+ on the right side and 2+ on the left side.       Posterior tibial pulses are 2+ on the right side and 2+ on the left side.     Heart sounds: Normal heart sounds, S1 normal and S2 normal. No murmur heard.   No friction rub. No gallop.  Pulmonary:     Effort: Pulmonary effort is normal. No accessory muscle usage or respiratory distress.     Breath sounds: Normal breath sounds and air entry. No stridor. No wheezing or rales.  Chest:     Chest wall: No tenderness.  Abdominal:     General: Bowel sounds are normal. There is no distension.     Palpations: Abdomen is soft. There is no shifting dullness, fluid wave, mass or pulsatile mass.     Tenderness: There is no abdominal tenderness. There is no guarding or rebound.  Musculoskeletal:        General: No tenderness or deformity. Normal  range of motion.     Cervical back: Normal range of motion and neck  supple.     Right lower leg: No edema.     Left lower leg: No edema.     Right foot: Normal range of motion. No deformity or bunion.     Left foot: Normal range of motion. No deformity or bunion.  Feet:     Right foot:     Protective Sensation: 6 sites tested.  6 sites sensed.     Skin integrity: No ulcer, blister, skin breakdown, erythema, warmth, callus, dry skin or fissure.     Toenail Condition: Right toenails are normal.     Left foot:     Protective Sensation: 6 sites tested.  6 sites sensed.     Skin integrity: No ulcer, blister, skin breakdown, erythema, warmth, callus, dry skin or fissure.     Toenail Condition: Left toenails are normal.  Lymphadenopathy:     Cervical: No cervical adenopathy.  Skin:    General: Skin is warm and dry.     Capillary Refill: Capillary refill takes less than 2 seconds.     Coloration: Skin is not pale.     Findings: No erythema or rash.  Neurological:     Mental Status: He is alert and oriented to person, place, and time.     Motor: No abnormal muscle tone.     Deep Tendon Reflexes: Reflexes are normal and symmetric.  Psychiatric:        Mood and Affect: Mood normal.        Behavior: Behavior normal. Behavior is cooperative.        Thought Content: Thought content normal.        Judgment: Judgment normal.   Diabetic Foot Exam - Simple   Simple Foot Form Diabetic Foot exam was performed with the following findings: Yes 03/14/2021 10:00 AM  Visual Inspection Sensation Testing Pulse Check Comments      Assessment/Plan: 1. Encounter for general adult medical examination with abnormal findings Age-appropriate preventive screenings and vaccinations discussed, annual physical exam completed. Routine labs for health maintenance done in June and discussed with patient today. Additional labs ordered, see below. PHM updated. Diabetic foot exam done today.  --screened for prostate cancer in June--PSA normal --routine screening colonoscopy done in  march 2022.   2. Type 2 diabetes mellitus with hyperglycemia, without long-term current use of insulin (HCC) A1C is 7.8 today. 1 month sample of Rybelsus '3mg'$  provided to patient at today's visit. Patient will take 3 mg daily for the first month, and then will increase to 7 mg for the second month. Discussed with patient and he is agreeable with the plan. Patient will continue to check his glucose level once daily in the morning. Follow up in 3 months to recheck A1C. Diet and lifestyle modifications discussed with patient.  - POCT HgB A1C - Semaglutide (RYBELSUS) 7 MG TABS; Take 7 mg by mouth daily.  Dispense: 30 tablet; Refill: 2 - dapagliflozin propanediol (FARXIGA) 10 MG TABS tablet; Take 1 tablet (10 mg total) by mouth daily before breakfast.  Dispense: 90 tablet; Refill: 0 - Lancets (ONETOUCH ULTRASOFT) lancets; Blood sugar testing daily - E11.65  Dispense: 100 each; Refill: 12 - glucose blood (ONETOUCH VERIO) test strip; Blood sugar testing QAM - E11.65  Dispense: 100 each; Refill: 12  3. Essential hypertension Not well-controlled, added admlodipine 5 mg daily, follow up in 4 weeks - losartan (COZAAR) 100 MG tablet; Take 1 tablet (100  mg total) by mouth daily.  Dispense: 90 tablet; Refill: 0 - hydrochlorothiazide (HYDRODIURIL) 25 MG tablet; Take 1 tablet po QD  Dispense: 90 tablet; Refill: 0 - amLODipine (NORVASC) 5 MG tablet; Take 1 tablet (5 mg total) by mouth daily.  Dispense: 90 tablet; Refill: 0  4. Dizziness Blood pressure and glucose levels not under optimal control. Either problem could potentially cause dizziness. Encouraged adequate hydration. Declined meclizine when offered. Follow up in 4 weeks will reassess will consider further evaluation if he is still experiencing dizziness at follow up visit.    5. Erythrocytosis Noted on CBC, repeat CBC.  - CBC with Differential/Platelet  6. Hypercalcemia Mild hypercalcemia on CMP in June, BMP ordered to reassess calcium level.  -Basic  Metabolic Panel  7. Chronic midline low back pain without sciatica Continue ibuprofen 800 mg as prescribed, add tizanidine at bedtime as needed.  - ibuprofen (ADVIL) 800 MG tablet; Take 1 tablet (800 mg total) by mouth every 8 (eight) hours as needed.  Dispense: 90 tablet; Refill: 0 - tiZANidine (ZANAFLEX) 4 MG tablet; Take 1 tablet (4 mg total) by mouth at bedtime as needed for muscle spasms.  Dispense: 30 tablet; Refill: 0  8. Spasm of muscle of lower back Tizanidine prescribed for muscle spasms of the lower back.  - tiZANidine (ZANAFLEX) 4 MG tablet; Take 1 tablet (4 mg total) by mouth at bedtime as needed for muscle spasms.  Dispense: 30 tablet; Refill: 0  9. Acquired hypothyroidism Levels are stable, refill ordered.  - levothyroxine (SYNTHROID) 50 MCG tablet; Take 1 tablet (50 mcg total) by mouth daily before breakfast.  Dispense: 90 tablet; Refill: 0  10. Mixed hyperlipidemia Although his VLDL, triglycerides and HDL are wnl, his total cholesterol and LDL has increased according to his lipid panel drawn in June. Recheck lipid panel, fasting.  - rosuvastatin (CRESTOR) 5 MG tablet; Take 1 tablet (5 mg total) by mouth daily.  Dispense: 90 tablet; Refill: 0 - Lipid Profile  11. Vitamin D deficiency Rule out low vitamin D - Vitamin D (25 hydroxy)  12. Chronic idiopathic gout involving toe of left foot without tophus Gout is stable at this time, he was on allopurinol but was switched to febuxostat which has been working better. - febuxostat (ULORIC) 40 MG tablet; Take 1 tablet (40 mg total) by mouth daily.  Dispense: 90 tablet; Refill: 0  13. Non-seasonal allergic rhinitis due to pollen Well-controlled with current medications, refills ordered. - cetirizine (ZYRTEC) 10 MG tablet; Take 1 tablet (10 mg total) by mouth daily.  Dispense: 90 tablet; Refill: 0 - montelukast (SINGULAIR) 10 MG tablet; Take one tab po qd for sinus congestion  Dispense: 90 tablet; Refill: 1 - fluticasone  (FLONASE) 50 MCG/ACT nasal spray; USE 2 SPRAYS IN EACH NOSTRIL DAILY, SHAKE LIQUID  Dispense: 48 g; Refill: 3  14. Need for tetanus booster - Tdap (BOOSTRIX) 5-2.5-18.5 LF-MCG/0.5 injection; Inject 0.5 mLs into the muscle once for 1 dose.  Dispense: 0.5 mL; Refill: 0  15. Dysuria Routine urinalysis done.  - UA/M w/rflx Culture, Routine - Microscopic Examination     General Counseling: [place] verbalizes understanding of the findings of todays visit and agrees with plan of treatment. I have discussed any further diagnostic evaluation that may be needed or ordered today. We also reviewed his medications today. he has been encouraged to call the office with any questions or concerns that should arise related to todays visit.    Orders Placed This Encounter  Procedures  Microscopic Examination   UA/M w/rflx Culture, Routine   CBC with Differential/Platelet   Lipid Profile   Vitamin D (25 hydroxy)   Basic Metabolic Panel (BMET)   POCT HgB A1C    Meds ordered this encounter  Medications   Tdap (BOOSTRIX) 5-2.5-18.5 LF-MCG/0.5 injection    Sig: Inject 0.5 mLs into the muscle once for 1 dose.    Dispense:  0.5 mL    Refill:  0   Semaglutide (RYBELSUS) 7 MG TABS    Sig: Take 7 mg by mouth daily.    Dispense:  30 tablet    Refill:  2   levothyroxine (SYNTHROID) 50 MCG tablet    Sig: Take 1 tablet (50 mcg total) by mouth daily before breakfast.    Dispense:  90 tablet    Refill:  0   rosuvastatin (CRESTOR) 5 MG tablet    Sig: Take 1 tablet (5 mg total) by mouth daily.    Dispense:  90 tablet    Refill:  0   febuxostat (ULORIC) 40 MG tablet    Sig: Take 1 tablet (40 mg total) by mouth daily.    Dispense:  90 tablet    Refill:  0    Pt needs appt for future refills   losartan (COZAAR) 100 MG tablet    Sig: Take 1 tablet (100 mg total) by mouth daily.    Dispense:  90 tablet    Refill:  0    Please fill as 90 day prescription   dapagliflozin propanediol (FARXIGA) 10 MG TABS  tablet    Sig: Take 1 tablet (10 mg total) by mouth daily before breakfast.    Dispense:  90 tablet    Refill:  0    Patient given samples along with $0 copay card for this prescription.   ibuprofen (ADVIL) 800 MG tablet    Sig: Take 1 tablet (800 mg total) by mouth every 8 (eight) hours as needed.    Dispense:  90 tablet    Refill:  0   cetirizine (ZYRTEC) 10 MG tablet    Sig: Take 1 tablet (10 mg total) by mouth daily.    Dispense:  90 tablet    Refill:  0   montelukast (SINGULAIR) 10 MG tablet    Sig: Take one tab po qd for sinus congestion    Dispense:  90 tablet    Refill:  1   Lancets (ONETOUCH ULTRASOFT) lancets    Sig: Blood sugar testing daily - E11.65    Dispense:  100 each    Refill:  12   hydrochlorothiazide (HYDRODIURIL) 25 MG tablet    Sig: Take 1 tablet po QD    Dispense:  90 tablet    Refill:  0    Please fill as 90 day prescription.   glucose blood (ONETOUCH VERIO) test strip    Sig: Blood sugar testing QAM - E11.65    Dispense:  100 each    Refill:  12   fluticasone (FLONASE) 50 MCG/ACT nasal spray    Sig: USE 2 SPRAYS IN EACH NOSTRIL DAILY, SHAKE LIQUID    Dispense:  48 g    Refill:  3    **Patient requests 90 days supply**   tiZANidine (ZANAFLEX) 4 MG tablet    Sig: Take 1 tablet (4 mg total) by mouth at bedtime as needed for muscle spasms.    Dispense:  30 tablet    Refill:  0   amLODipine (NORVASC) 5 MG tablet  Sig: Take 1 tablet (5 mg total) by mouth daily.    Dispense:  90 tablet    Refill:  0    Return in about 4 weeks (around 04/11/2021) for F/U, Review labs/test, BP check, Tonnia Bardin PCP.   Total time spent:30 Minutes Time spent includes review of chart, medications, test results, and follow up plan with the patient.   [place] Controlled Substance Database was reviewed by me.  This patient was seen by Jonetta Osgood, FNP-C in collaboration with Dr. Clayborn Bigness as a part of collaborative care agreement.  Jhovany Weidinger R. Valetta Fuller, MSN,  FNP-C Internal medicine

## 2021-03-15 LAB — UA/M W/RFLX CULTURE, ROUTINE
Bilirubin, UA: NEGATIVE
Ketones, UA: NEGATIVE
Leukocytes,UA: NEGATIVE
Nitrite, UA: NEGATIVE
Protein,UA: NEGATIVE
RBC, UA: NEGATIVE
Specific Gravity, UA: 1.028 (ref 1.005–1.030)
Urobilinogen, Ur: 0.2 mg/dL (ref 0.2–1.0)
pH, UA: 5.5 (ref 5.0–7.5)

## 2021-03-15 LAB — MICROSCOPIC EXAMINATION
Bacteria, UA: NONE SEEN
Casts: NONE SEEN /lpf
Epithelial Cells (non renal): NONE SEEN /hpf (ref 0–10)
RBC, Urine: NONE SEEN /hpf (ref 0–2)
WBC, UA: NONE SEEN /hpf (ref 0–5)

## 2021-03-16 ENCOUNTER — Telehealth: Payer: Self-pay

## 2021-03-16 NOTE — Telephone Encounter (Signed)
Send PA through cover my meds waiting for respond

## 2021-03-23 ENCOUNTER — Encounter: Payer: Self-pay | Admitting: Nurse Practitioner

## 2021-04-11 ENCOUNTER — Ambulatory Visit: Payer: BC Managed Care – PPO | Admitting: Nurse Practitioner

## 2021-04-13 ENCOUNTER — Other Ambulatory Visit: Payer: Self-pay | Admitting: Nurse Practitioner

## 2021-04-13 DIAGNOSIS — M62830 Muscle spasm of back: Secondary | ICD-10-CM

## 2021-04-13 DIAGNOSIS — G8929 Other chronic pain: Secondary | ICD-10-CM

## 2021-04-14 ENCOUNTER — Encounter: Payer: Self-pay | Admitting: Nurse Practitioner

## 2021-04-14 ENCOUNTER — Telehealth: Payer: BC Managed Care – PPO | Admitting: Nurse Practitioner

## 2021-04-14 VITALS — BP 157/106 | HR 82 | Temp 96.9°F | Resp 16 | Ht 71.0 in | Wt 260.0 lb

## 2021-04-14 DIAGNOSIS — U071 COVID-19: Secondary | ICD-10-CM | POA: Diagnosis not present

## 2021-04-14 DIAGNOSIS — R059 Cough, unspecified: Secondary | ICD-10-CM | POA: Diagnosis not present

## 2021-04-14 DIAGNOSIS — J069 Acute upper respiratory infection, unspecified: Secondary | ICD-10-CM | POA: Diagnosis not present

## 2021-04-14 MED ORDER — NIRMATRELVIR/RITONAVIR (PAXLOVID)TABLET: 3.0000 | ORAL_TABLET | Freq: Two times a day (BID) | ORAL | 0 refills | Status: AC

## 2021-04-14 MED ORDER — BENZONATATE 100 MG PO CAPS: 100.0000 mg | ORAL_CAPSULE | Freq: Two times a day (BID) | ORAL | 0 refills | Status: DC | PRN

## 2021-04-14 NOTE — Progress Notes (Signed)
[facility] [place], [place] [address]  Internal MEDICINE  Telephone Visit  Patient Name: Ronald Mata  [id number]  [id number]  Date of Service: 04/14/2021  I connected with the patient at 9:10 AM by telephone and verified the patients identity using two identifiers.   I discussed the limitations, risks, security and privacy concerns of performing an evaluation and management service by telephone and the availability of in person appointments. I also discussed with the patient that there may be a patient responsible charge related to the service.  The patient expressed understanding and agrees to proceed.    Chief Complaint  Patient presents with   Telephone Assessment    Virtual [phone]   Telephone Screen   Cough    Symptoms started late Monday or Tuesday, told Sunday they were exposed to Covid, home test was positive on Wed   Sinusitis   Dizziness   Generalized Body Aches   Fever    HPI [name] presents for a telehealth virtual visit for positive COVID test. His symptoms started2 days ago on Tuesday. His wife is also sick and they were informed that they were exposed to COVID on Sunday and tested positive on Wednesday. He is having fever, chills, fatigue, body aches, headaches, nasal congestion ,runny nose, sinus pain/pressure, sneezing, cough, chest tightness. He denies SOB, wheezing, and sore throat.    Current Medication: Outpatient Encounter Medications as of 04/14/2021  Medication Sig   amLODipine (NORVASC) 5 MG tablet Take 1 tablet (5 mg total) by mouth daily.   benzonatate (TESSALON) 100 MG capsule Take 1 capsule (100 mg total) by mouth 2 (two) times daily as needed for cough.   cetirizine (ZYRTEC) 10 MG tablet Take 1 tablet (10 mg total) by mouth daily.   co-enzyme Q-10 30 MG capsule Take 30 mg by mouth daily.   dapagliflozin propanediol (FARXIGA) 10 MG TABS tablet Take 1 tablet (10 mg total) by mouth daily before breakfast.   ELDERBERRY PO  Take by mouth daily.   febuxostat (ULORIC) 40 MG tablet Take 1 tablet (40 mg total) by mouth daily.   fluticasone (FLONASE) 50 MCG/ACT nasal spray USE 2 SPRAYS IN EACH NOSTRIL DAILY, SHAKE LIQUID   glucose blood (ONETOUCH VERIO) test strip Blood sugar testing QAM - E11.65   hydrochlorothiazide (HYDRODIURIL) 25 MG tablet Take 1 tablet po QD   ibuprofen (ADVIL) 800 MG tablet Take 1 tablet (800 mg total) by mouth every 8 (eight) hours as needed.   Lancets (ONETOUCH ULTRASOFT) lancets Blood sugar testing daily - E11.65   levothyroxine (SYNTHROID) 50 MCG tablet Take 1 tablet (50 mcg total) by mouth daily before breakfast.   losartan (COZAAR) 100 MG tablet Take 1 tablet (100 mg total) by mouth daily.   montelukast (SINGULAIR) 10 MG tablet Take one tab po qd for sinus congestion   nirmatrelvir/ritonavir EUA (PAXLOVID) 20 x 150 MG & 10 x 100MG TABS Take 3 tablets by mouth 2 (two) times daily for 5 days.   rosuvastatin (CRESTOR) 5 MG tablet Take 1 tablet (5 mg total) by mouth daily.   Semaglutide (RYBELSUS) 7 MG TABS Take 7 mg by mouth daily.   tiZANidine (ZANAFLEX) 4 MG tablet TAKE 1 TABLET(4 MG) BY MOUTH AT BEDTIME AS NEEDED FOR MUSCLE SPASMS   No facility-administered encounter medications on file as of 04/14/2021.    Surgical History: Past Surgical History:  Procedure Laterality Date   CARDIAC CATHETERIZATION     15  years ago no stents  COLONOSCOPY WITH PROPOFOL N/A 10/15/2020   Procedure: COLONOSCOPY WITH PROPOFOL;  Surgeon: [name], MD;  Location: [facility];  Service: Endoscopy;  Laterality: N/A;  Diabetic   POLYPECTOMY  10/15/2020   Procedure: POLYPECTOMY;  Surgeon: [name], MD;  Location: [facility];  Service: Endoscopy;;   stomach wrap and achalasia      Medical History: Past Medical History:  Diagnosis Date   Arthritis    feet   Diabetes mellitus without complication ([place])    Heart murmur    Hypertension    Thyroid disease     Family  History: Family History  Problem Relation Age of Onset   Diabetes Mother    Hypertension Mother    Hyperlipidemia Mother    Stroke Mother    Osteoarthritis Mother    Diabetes Father    Osteoarthritis Father    Glaucoma Father     Social History   Socioeconomic History   Marital status: Married    Spouse name: Not on file   Number of children: Not on file   Years of education: Not on file   Highest education level: Not on file  Occupational History   Not on file  Tobacco Use   Smoking status: Never   Smokeless tobacco: Never  Vaping Use   Vaping Use: Never used  Substance and Sexual Activity   Alcohol use: No    Alcohol/week: 0.0 standard drinks   Drug use: No   Sexual activity: Not on file  Other Topics Concern   Not on file  Social History Narrative   Not on file   Social Determinants of Health   Financial Resource Strain: Not on file  Food Insecurity: Not on file  Transportation Needs: Not on file  Physical Activity: Not on file  Stress: Not on file  Social Connections: Not on file  Intimate Partner Violence: Not on file      Review of Systems  Constitutional:  Positive for chills, fatigue and fever.  HENT:  Positive for congestion, postnasal drip, rhinorrhea, sinus pressure, sinus pain and sneezing. Negative for sore throat and trouble swallowing.   Respiratory:  Positive for cough and chest tightness. Negative for shortness of breath and wheezing.   Cardiovascular:  Negative for chest pain and palpitations.  Gastrointestinal:  Negative for abdominal pain, constipation, diarrhea, nausea and vomiting.  Musculoskeletal:  Positive for myalgias.  Skin:  Negative for rash.  Neurological:  Positive for dizziness and headaches. Negative for light-headedness.   Vital Signs: BP (!) 157/106   Pulse 82   Temp (!) 96.9 F (36.1 C)   Resp 16   Ht 5\' 11"  (1.803 m)   Wt 260 lb (117.9 kg)   SpO2 97%   BMI 36.26 kg/m    Observation/Objective: Traeson is alert  and oriented, engages in conversation appropriately. He appears to be in no acute distress over video call.     Assessment/Plan: 1. Upper respiratory tract infection due to COVID-19 virus Paxlovid prescribed - nirmatrelvir/ritonavir EUA (PAXLOVID) 20 x 150 MG & 10 x 100MG  TABS; Take 3 tablets by mouth 2 (two) times daily for 5 days.  Dispense: 30 tablet; Refill: 0  2. Cough Benzonatate sent to pharmacy - benzonatate (TESSALON) 100 MG capsule; Take 1 capsule (100 mg total) by mouth 2 (two) times daily as needed for cough.  Dispense: 30 capsule; Refill: 0   General Counseling: Kaj verbalizes understanding of the findings of today's phone visit and agrees with plan of treatment.  I have discussed any further diagnostic evaluation that may be needed or ordered today. We also reviewed his medications today. he has been encouraged to call the office with any questions or concerns that should arise related to todays visit.  Return if symptoms worsen or fail to improve.   No orders of the defined types were placed in this encounter.   Meds ordered this encounter  Medications   nirmatrelvir/ritonavir EUA (PAXLOVID) 20 x 150 MG & 10 x 100MG  TABS    Sig: Take 3 tablets by mouth 2 (two) times daily for 5 days.    Dispense:  30 tablet    Refill:  0   benzonatate (TESSALON) 100 MG capsule    Sig: Take 1 capsule (100 mg total) by mouth 2 (two) times daily as needed for cough.    Dispense:  30 capsule    Refill:  0    Time spent:20 Minutes Time spent with patient included reviewing progress notes, labs, imaging studies, and discussing plan for follow up.  [place] Controlled Substance Database was reviewed by me for overdose risk score (ORS) if appropriate.  This patient was seen by Jonetta Osgood, FNP-C in collaboration with Dr. Clayborn Bigness as a part of collaborative care agreement.  Noga Fogg R. Valetta Fuller, MSN, FNP-C Internal medicine

## 2021-04-19 ENCOUNTER — Telehealth: Payer: Self-pay

## 2021-04-19 MED ORDER — AZITHROMYCIN 250 MG PO TABS: ORAL_TABLET | ORAL | 0 refills | Status: DC

## 2021-04-19 NOTE — Telephone Encounter (Signed)
pt called and stated he has completed his paxlovid meds and he is still having weakness, difficulty breathing, chest heaviness,sinus pain, HA, cough and hoarseness.  he is still testing positive for covid, he is asking if he should make appt to come in or what do you feel he needs to do.  he had video visit on 04/14/21.  We advised pt to take OTC Mucinex, drink plenty of fluids, rest, gargle w/salt water.  Per Alyssa, we sent in z-pak to pt's pharmacy(6 day taper)

## 2021-04-20 ENCOUNTER — Other Ambulatory Visit: Payer: Self-pay | Admitting: Nurse Practitioner

## 2021-04-20 ENCOUNTER — Telehealth: Payer: Self-pay

## 2021-04-20 DIAGNOSIS — J069 Acute upper respiratory infection, unspecified: Secondary | ICD-10-CM

## 2021-04-20 DIAGNOSIS — R059 Cough, unspecified: Secondary | ICD-10-CM

## 2021-04-20 DIAGNOSIS — R062 Wheezing: Secondary | ICD-10-CM

## 2021-04-20 MED ORDER — ALBUTEROL SULFATE HFA 108 (90 BASE) MCG/ACT IN AERS: 2.0000 | INHALATION_SPRAY | RESPIRATORY_TRACT | 0 refills | Status: DC | PRN

## 2021-04-20 NOTE — Telephone Encounter (Signed)
pt called and stated he has completed his paxlovid meds and he is still having weakness, difficulty breathing,  HA, cough and hoarseness.  he is still testing positive for covid, he is asking if he should make appt to come in or what do you feel he needs to do.  he had video visit on 04/14/21  pt called back today and is asking if he can get a chest xray.   also is asking about a inhaler that could help with his breathing issues.  he use to have an inhaler but it has been many years ago.  I sent albuterol inhaler and order for chest xray to [facility], he can go anytime for chest xray no appt needed. I sent albuterol inhaler. no prednisone since he is diabetic  Called pt back to let him know about xray and rx sent to pharmacy no answer I [facility]

## 2021-04-21 ENCOUNTER — Ambulatory Visit
Admission: RE | Admit: 2021-04-21 | Discharge: 2021-04-21 | Disposition: A | Discharge status: Home / Self Care | Payer: BC Managed Care – PPO | Attending: Nurse Practitioner | Admitting: Nurse Practitioner

## 2021-04-21 ENCOUNTER — Telehealth: Payer: Self-pay

## 2021-04-21 ENCOUNTER — Other Ambulatory Visit: Payer: Self-pay

## 2021-04-21 ENCOUNTER — Ambulatory Visit
Admission: RE | Admit: 2021-04-21 | Discharge: 2021-04-21 | Disposition: A | Discharge status: Home / Self Care | Payer: BC Managed Care – PPO | Source: Ambulatory Visit | Attending: Nurse Practitioner | Admitting: Nurse Practitioner

## 2021-04-21 DIAGNOSIS — U071 COVID-19: Secondary | ICD-10-CM | POA: Insufficient documentation

## 2021-04-21 DIAGNOSIS — J069 Acute upper respiratory infection, unspecified: Secondary | ICD-10-CM

## 2021-04-21 DIAGNOSIS — R059 Cough, unspecified: Secondary | ICD-10-CM

## 2021-04-21 NOTE — Telephone Encounter (Signed)
Pt called about xray  result advised him its not back yet we can call him when result are back

## 2021-04-22 ENCOUNTER — Telehealth: Payer: Self-pay

## 2021-04-22 NOTE — Telephone Encounter (Signed)
-----   Message from Jonetta Osgood, NP sent at 04/22/2021  6:58 AM EDT ----- Chest xray is negative for any acute changes or pneumonia.

## 2021-04-22 NOTE — Progress Notes (Signed)
Chest xray is negative for any acute changes or pneumonia.

## 2021-04-22 NOTE — Telephone Encounter (Signed)
Spoke back to patient and advised him to let us know how he is on Monday and maybe we can do a virtual visit if no better.  Pt asked about any further testing and I advised he could go to [facility] and do PCR test to make sure if home test was matching.  Informed pt that if his symptoms worsen over weekend to go to urgent care or ER

## 2021-04-24 ENCOUNTER — Other Ambulatory Visit: Payer: Self-pay | Admitting: Nurse Practitioner

## 2021-04-24 DIAGNOSIS — J301 Allergic rhinitis due to pollen: Secondary | ICD-10-CM

## 2021-04-25 ENCOUNTER — Telehealth: Payer: Self-pay

## 2021-04-25 NOTE — Telephone Encounter (Signed)
error 

## 2021-04-25 NOTE — Telephone Encounter (Signed)
LMOM to confirm we spoke about his chest xray being negative for any acute changes or pneumonia

## 2021-05-11 ENCOUNTER — Other Ambulatory Visit: Payer: Self-pay

## 2021-05-11 DIAGNOSIS — E1165 Type 2 diabetes mellitus with hyperglycemia: Secondary | ICD-10-CM

## 2021-05-11 MED ORDER — RYBELSUS 7 MG PO TABS: 7.0000 mg | ORAL_TABLET | Freq: Every day | ORAL | 0 refills | Status: DC

## 2021-05-14 ENCOUNTER — Other Ambulatory Visit: Payer: Self-pay | Admitting: Internal Medicine

## 2021-05-14 DIAGNOSIS — E039 Hypothyroidism, unspecified: Secondary | ICD-10-CM

## 2021-05-14 DIAGNOSIS — I10 Essential (primary) hypertension: Secondary | ICD-10-CM

## 2021-05-15 ENCOUNTER — Other Ambulatory Visit: Payer: Self-pay

## 2021-05-15 DIAGNOSIS — E039 Hypothyroidism, unspecified: Secondary | ICD-10-CM

## 2021-05-15 DIAGNOSIS — I10 Essential (primary) hypertension: Secondary | ICD-10-CM

## 2021-05-15 MED ORDER — LEVOTHYROXINE SODIUM 50 MCG PO TABS: 50.0000 ug | ORAL_TABLET | Freq: Every day | ORAL | 1 refills | Status: DC

## 2021-05-15 MED ORDER — HYDROCHLOROTHIAZIDE 25 MG PO TABS: ORAL_TABLET | ORAL | 1 refills | Status: DC

## 2021-05-15 MED ORDER — LOSARTAN POTASSIUM 100 MG PO TABS: 100.0000 mg | ORAL_TABLET | Freq: Every day | ORAL | 1 refills | Status: DC

## 2021-05-16 ENCOUNTER — Other Ambulatory Visit: Payer: Self-pay | Admitting: Internal Medicine

## 2021-05-16 DIAGNOSIS — E1165 Type 2 diabetes mellitus with hyperglycemia: Secondary | ICD-10-CM

## 2021-05-16 DIAGNOSIS — E782 Mixed hyperlipidemia: Secondary | ICD-10-CM

## 2021-05-20 ENCOUNTER — Other Ambulatory Visit: Payer: Self-pay

## 2021-05-20 MED ORDER — CONTOUR NEXT TEST VI STRP: ORAL_STRIP | 12 refills | Status: DC

## 2021-05-20 MED ORDER — CONTOUR NEXT MONITOR W/DEVICE KIT: PACK | 3 refills | Status: DC

## 2021-05-25 ENCOUNTER — Ambulatory Visit: Payer: BC Managed Care – PPO | Admitting: Nurse Practitioner

## 2021-05-25 DIAGNOSIS — Z0289 Encounter for other administrative examinations: Secondary | ICD-10-CM

## 2021-06-01 DIAGNOSIS — H35033 Hypertensive retinopathy, bilateral: Secondary | ICD-10-CM | POA: Diagnosis not present

## 2021-06-20 ENCOUNTER — Encounter: Payer: Self-pay | Admitting: Internal Medicine

## 2021-06-20 ENCOUNTER — Telehealth: Payer: BC Managed Care – PPO | Admitting: Internal Medicine

## 2021-06-20 ENCOUNTER — Other Ambulatory Visit: Payer: Self-pay

## 2021-06-20 ENCOUNTER — Telehealth: Payer: Self-pay

## 2021-06-20 VITALS — BP 148/92 | HR 81 | Temp 97.1°F | Ht 71.0 in | Wt 250.0 lb

## 2021-06-20 DIAGNOSIS — J45909 Unspecified asthma, uncomplicated: Secondary | ICD-10-CM | POA: Diagnosis not present

## 2021-06-20 DIAGNOSIS — E1165 Type 2 diabetes mellitus with hyperglycemia: Secondary | ICD-10-CM | POA: Diagnosis not present

## 2021-06-20 DIAGNOSIS — I10 Essential (primary) hypertension: Secondary | ICD-10-CM | POA: Diagnosis not present

## 2021-06-20 MED ORDER — LEVOFLOXACIN 500 MG PO TABS: 500.0000 mg | ORAL_TABLET | Freq: Every day | ORAL | 0 refills | Status: AC

## 2021-06-20 MED ORDER — ALBUTEROL SULFATE HFA 108 (90 BASE) MCG/ACT IN AERS: 2.0000 | INHALATION_SPRAY | Freq: Four times a day (QID) | RESPIRATORY_TRACT | 0 refills | Status: DC | PRN

## 2021-06-20 MED ORDER — HYDROCOD POLST-CPM POLST ER 10-8 MG/5ML PO SUER: 5.0000 mL | Freq: Two times a day (BID) | ORAL | 0 refills | Status: DC | PRN

## 2021-06-20 NOTE — Telephone Encounter (Signed)
Lvm to schedule follow up appointment-Toni 

## 2021-06-20 NOTE — Progress Notes (Signed)
[facility] [place], [place] [address]  Internal MEDICINE  Telephone Visit  Patient Name: Ronald Mata  [id number]  [id number]  Date of Service: 06/20/2021  I connected with the patient at 150 by telephone and verified the patients identity using two identifiers.   I discussed the limitations, risks, security and privacy concerns of performing an evaluation and management service by telephone and the availability of in person appointments. I also discussed with the patient that there may be a patient responsible charge related to the service.  The patient expressed understanding and agrees to proceed.    Chief Complaint  Patient presents with   Telephone Assessment    [phone] video   Telephone Screen   Cough    Runny nose, sneezing.  Symptoms started Friday 06/17/21.  Covid test done today and was negative   Nasal Congestion   Wheezing   Shortness of Breath   Dizziness   Hypertension    HPI  C/O Sinus congestion, got worse, lost his voice. Sob now, denies any fever or chills, vaccinated  and had Covid in October, was treated with Paxlovid in October Is concerned that he is not improving since he has been sick and is having shortness of breath now, previous x-ray of the chest has been without any acute pathology. Patient is a diabetic, his diabetes is not well controlled. Has hypertension which is also uncontrolled has been advised to get a sleep study to look into sleep apnea  Current Medication: Outpatient Encounter Medications as of 06/20/2021  Medication Sig   albuterol (VENTOLIN HFA) 108 (90 Base) MCG/ACT inhaler Inhale 2 puffs into the lungs every 6 (six) hours as needed for wheezing or shortness of breath.   amLODipine (NORVASC) 5 MG tablet Take 1 tablet (5 mg total) by mouth daily.   azithromycin (ZITHROMAX Z-PAK) 250 MG tablet Use as directed for 5 days   benzonatate (TESSALON) 100 MG capsule Take 1 capsule (100 mg total) by mouth 2 (two)  times daily as needed for cough.   Blood Glucose Monitoring Suppl (CONTOUR NEXT MONITOR) w/Device KIT Use to check blood sugars twice a day E11.65   cetirizine (ZYRTEC) 10 MG tablet Take 1 tablet (10 mg total) by mouth daily.   chlorpheniramine-HYDROcodone (TUSSIONEX PENNKINETIC ER) 10-8 MG/5ML SUER Take 5 mLs by mouth every 12 (twelve) hours as needed for cough.   co-enzyme Q-10 30 MG capsule Take 30 mg by mouth daily.   ELDERBERRY PO Take by mouth daily.   FARXIGA 10 MG TABS tablet TAKE 1 TABLET BY MOUTH DAILY BEFORE BREAKFAST   febuxostat (ULORIC) 40 MG tablet Take 1 tablet (40 mg total) by mouth daily.   fluticasone (FLONASE) 50 MCG/ACT nasal spray USE 2 SPRAYS IN EACH NOSTRIL DAILY, SHAKE LIQUID   glucose blood (CONTOUR NEXT TEST) test strip Use as instructed to check blood sugars twice a day E11.65   hydrochlorothiazide (HYDRODIURIL) 25 MG tablet Take 1 tablet po QD   ibuprofen (ADVIL) 800 MG tablet Take 1 tablet (800 mg total) by mouth every 8 (eight) hours as needed.   Lancets (ONETOUCH ULTRASOFT) lancets Blood sugar testing daily - E11.65   levofloxacin (LEVAQUIN) 500 MG tablet Take 1 tablet (500 mg total) by mouth daily for 7 days.   levothyroxine (SYNTHROID) 50 MCG tablet Take 1 tablet (50 mcg total) by mouth daily before breakfast.   losartan (COZAAR) 100 MG tablet Take 1 tablet (100 mg total) by mouth daily.   montelukast (SINGULAIR)  10 MG tablet Take one tab po qd for sinus congestion   rosuvastatin (CRESTOR) 5 MG tablet TAKE 1 TABLET BY MOUTH DAILY   Semaglutide (RYBELSUS) 7 MG TABS Take 7 mg by mouth daily.   tiZANidine (ZANAFLEX) 4 MG tablet TAKE 1 TABLET(4 MG) BY MOUTH AT BEDTIME AS NEEDED FOR MUSCLE SPASMS   [DISCONTINUED] albuterol (VENTOLIN HFA) 108 (90 Base) MCG/ACT inhaler Inhale 2 puffs into the lungs every 4 (four) hours as needed for wheezing or shortness of breath.   No facility-administered encounter medications on file as of 06/20/2021.    Surgical History: Past  Surgical History:  Procedure Laterality Date   CARDIAC CATHETERIZATION     15 years ago no stents   COLONOSCOPY WITH PROPOFOL N/A 10/15/2020   Procedure: COLONOSCOPY WITH PROPOFOL;  Surgeon: [name], MD;  Location: [facility];  Service: Endoscopy;  Laterality: N/A;  Diabetic   POLYPECTOMY  10/15/2020   Procedure: POLYPECTOMY;  Surgeon: [name], MD;  Location: [facility];  Service: Endoscopy;;   stomach wrap and achalasia      Medical History: Past Medical History:  Diagnosis Date   Arthritis    feet   Diabetes mellitus without complication ([place])    Heart murmur    Hypertension    Thyroid disease     Family History: Family History  Problem Relation Age of Onset   Diabetes Mother    Hypertension Mother    Hyperlipidemia Mother    Stroke Mother    Osteoarthritis Mother    Diabetes Father    Osteoarthritis Father    Glaucoma Father     Social History   Socioeconomic History   Marital status: Married    Spouse name: Not on file   Number of children: Not on file   Years of education: Not on file   Highest education level: Not on file  Occupational History   Not on file  Tobacco Use   Smoking status: Never   Smokeless tobacco: Never  Vaping Use   Vaping Use: Never used  Substance and Sexual Activity   Alcohol use: No    Alcohol/week: 0.0 standard drinks   Drug use: No   Sexual activity: Not on file  Other Topics Concern   Not on file  Social History Narrative   Not on file   Social Determinants of Health   Financial Resource Strain: Not on file  Food Insecurity: Not on file  Transportation Needs: Not on file  Physical Activity: Not on file  Stress: Not on file  Social Connections: Not on file  Intimate Partner Violence: Not on file      Review of Systems  Constitutional:  Negative for fatigue and fever.  HENT:  Negative for congestion, mouth sores and postnasal drip.   Respiratory:  Positive for shortness of breath.  Negative for cough.   Cardiovascular:  Negative for chest pain.  Genitourinary:  Negative for flank pain.  Musculoskeletal:  Positive for arthralgias.  Psychiatric/Behavioral: Negative.     Vital Signs: BP (!) 148/92   Pulse 81   Temp (!) 97.1 F (36.2 C)   Ht 5' 11" (1.803 m)   Wt 250 lb (113.4 kg)   BMI 34.87 kg/m    Observation/Objective: Patient is able to communicate well with no apparent distress    Assessment/Plan: 1. Acute asthmatic bronchitis Patient continues to feel unwell no signs of improvement in symptoms concerns, will start therapy as prescribed today we will monitor, might need chest  x-ray if no improvement seen - levofloxacin (LEVAQUIN) 500 MG tablet; Take 1 tablet (500 mg total) by mouth daily for 7 days.  Dispense: 7 tablet; Refill: 0 - chlorpheniramine-HYDROcodone (TUSSIONEX PENNKINETIC ER) 10-8 MG/5ML SUER; Take 5 mLs by mouth every 12 (twelve) hours as needed for cough.  Dispense: 140 mL; Refill: 0 - CBC with Differential/Platelet - albuterol (VENTOLIN HFA) 108 (90 Base) MCG/ACT inhaler; Inhale 2 puffs into the lungs every 6 (six) hours as needed for wheezing or shortness of breath.  Dispense: 8 g; Refill: 0  2. Hypercalcemia Previous history of hypercalcemia we will repeat labs - CMP14+EGFR  3. Type 2 diabetes mellitus with hyperglycemia, without long-term current use of insulin ([place]) Will continue to monitor his diabetes patient has coming up follow-up appointment Okay thank you 4. Uncontrolled hypertension History of uncontrolled hypertension patient needs follow-up, might need sleep study in future as well    General Counseling: Dwayne verbalizes understanding of the findings of today's phone visit and agrees with plan of treatment. I have discussed any further diagnostic evaluation that may be needed or ordered today. We also reviewed his medications today. he has been encouraged to call the office with any questions or concerns that should arise  related to todays visit.    Orders Placed This Encounter  Procedures   CMP14+EGFR   CBC with Differential/Platelet    Meds ordered this encounter  Medications   levofloxacin (LEVAQUIN) 500 MG tablet    Sig: Take 1 tablet (500 mg total) by mouth daily for 7 days.    Dispense:  7 tablet    Refill:  0   chlorpheniramine-HYDROcodone (TUSSIONEX PENNKINETIC ER) 10-8 MG/5ML SUER    Sig: Take 5 mLs by mouth every 12 (twelve) hours as needed for cough.    Dispense:  140 mL    Refill:  0   albuterol (VENTOLIN HFA) 108 (90 Base) MCG/ACT inhaler    Sig: Inhale 2 puffs into the lungs every 6 (six) hours as needed for wheezing or shortness of breath.    Dispense:  8 g    Refill:  0    Time spent:15 Minutes    Dr Lavera Guise Internal medicine

## 2021-06-30 DIAGNOSIS — H6983 Other specified disorders of Eustachian tube, bilateral: Secondary | ICD-10-CM | POA: Diagnosis not present

## 2021-06-30 DIAGNOSIS — H6123 Impacted cerumen, bilateral: Secondary | ICD-10-CM | POA: Diagnosis not present

## 2021-06-30 DIAGNOSIS — J301 Allergic rhinitis due to pollen: Secondary | ICD-10-CM | POA: Diagnosis not present

## 2021-07-14 ENCOUNTER — Other Ambulatory Visit: Payer: Self-pay

## 2021-07-14 DIAGNOSIS — I10 Essential (primary) hypertension: Secondary | ICD-10-CM

## 2021-07-14 MED ORDER — AMLODIPINE BESYLATE 5 MG PO TABS: 5.0000 mg | ORAL_TABLET | Freq: Every day | ORAL | 1 refills | Status: DC

## 2021-07-15 ENCOUNTER — Ambulatory Visit: Payer: BC Managed Care – PPO | Admitting: Nurse Practitioner

## 2021-08-08 ENCOUNTER — Other Ambulatory Visit: Payer: Self-pay | Admitting: Nurse Practitioner

## 2021-08-08 DIAGNOSIS — J301 Allergic rhinitis due to pollen: Secondary | ICD-10-CM

## 2021-08-16 ENCOUNTER — Telehealth: Payer: Self-pay

## 2021-08-17 ENCOUNTER — Ambulatory Visit: Payer: BC Managed Care – PPO | Admitting: Nurse Practitioner

## 2021-08-17 ENCOUNTER — Other Ambulatory Visit: Payer: Self-pay

## 2021-08-17 ENCOUNTER — Encounter: Payer: Self-pay | Admitting: Nurse Practitioner

## 2021-08-17 VITALS — BP 144/98 | HR 75 | Temp 98.5°F | Resp 16 | Ht 71.0 in | Wt 255.8 lb

## 2021-08-17 DIAGNOSIS — I10 Essential (primary) hypertension: Secondary | ICD-10-CM

## 2021-08-17 DIAGNOSIS — E1165 Type 2 diabetes mellitus with hyperglycemia: Secondary | ICD-10-CM | POA: Diagnosis not present

## 2021-08-17 DIAGNOSIS — R9431 Abnormal electrocardiogram [ECG] [EKG]: Secondary | ICD-10-CM | POA: Diagnosis not present

## 2021-08-17 DIAGNOSIS — R0789 Other chest pain: Secondary | ICD-10-CM | POA: Diagnosis not present

## 2021-08-17 MED ORDER — LOSARTAN POTASSIUM 100 MG PO TABS: 50.0000 mg | ORAL_TABLET | Freq: Every day | ORAL | 1 refills | Status: DC

## 2021-08-17 NOTE — Progress Notes (Signed)
[facility] [place], [place] [address]  Internal MEDICINE  Office Visit Note  Patient Name: Ronald Mata  [id number]  [id number]  Date of Service: 09/11/2021  Chief Complaint  Patient presents with   Acute Visit    Feeling off, weak, disoriented, unable to get thoughts together, was worse yesterday, some difficulty taking deep breaths, ear pressure    Hypertension   Dizziness   Fatigue   Headache     HPI Ronald Mata presents for an acute sick visit for generalized weakness, dizziness and feeling disorganized. His blood pressure has been as low as 116/70 which is lower than normal for him.He has been working with a nutritionist and has lost down to 248 lbs as of this morning on his scale at home (weight was 255 lbs in office today). His weight was 268 lbs in august last year.  He also had Covid in the past few months and had significant lingering respiratory symptoms but chest xray was clear when checked. Additional symptoms he has now are fatigue, headache, difficulty taking deep breaths and ear pressure. He also reports that he sometimes feels a dull pain and tightness in his chest.  EKG done in office today. It was sinus rhythm with a nonspecific T-abnormality.    Current Medication:  Outpatient Encounter Medications as of 08/17/2021  Medication Sig   albuterol (VENTOLIN HFA) 108 (90 Base) MCG/ACT inhaler Inhale 2 puffs into the lungs every 6 (six) hours as needed for wheezing or shortness of breath.   amLODipine (NORVASC) 5 MG tablet Take 1 tablet (5 mg total) by mouth daily.   benzonatate (TESSALON) 100 MG capsule Take 1 capsule (100 mg total) by mouth 2 (two) times daily as needed for cough.   Blood Glucose Monitoring Suppl (CONTOUR NEXT MONITOR) w/Device KIT Use to check blood sugars twice a day E11.65   cetirizine (ZYRTEC) 10 MG tablet TAKE 1 TABLET(10 MG) BY MOUTH DAILY   chlorpheniramine-HYDROcodone (TUSSIONEX PENNKINETIC ER) 10-8 MG/5ML SUER Take 5 mLs  by mouth every 12 (twelve) hours as needed for cough.   co-enzyme Q-10 30 MG capsule Take 30 mg by mouth daily.   ELDERBERRY PO Take by mouth daily.   fluticasone (FLONASE) 50 MCG/ACT nasal spray USE 2 SPRAYS IN EACH NOSTRIL DAILY, SHAKE LIQUID   glucose blood (CONTOUR NEXT TEST) test strip Use as instructed to check blood sugars twice a day E11.65   Lancets (ONETOUCH ULTRASOFT) lancets Blood sugar testing daily - E11.65   tiZANidine (ZANAFLEX) 4 MG tablet TAKE 1 TABLET(4 MG) BY MOUTH AT BEDTIME AS NEEDED FOR MUSCLE SPASMS   [DISCONTINUED] azithromycin (ZITHROMAX Z-PAK) 250 MG tablet Use as directed for 5 days   [DISCONTINUED] FARXIGA 10 MG TABS tablet TAKE 1 TABLET BY MOUTH DAILY BEFORE BREAKFAST   [DISCONTINUED] febuxostat (ULORIC) 40 MG tablet Take 1 tablet (40 mg total) by mouth daily.   [DISCONTINUED] hydrochlorothiazide (HYDRODIURIL) 25 MG tablet Take 1 tablet po QD   [DISCONTINUED] ibuprofen (ADVIL) 800 MG tablet Take 1 tablet (800 mg total) by mouth every 8 (eight) hours as needed.   [DISCONTINUED] levothyroxine (SYNTHROID) 50 MCG tablet Take 1 tablet (50 mcg total) by mouth daily before breakfast.   [DISCONTINUED] losartan (COZAAR) 100 MG tablet Take 1 tablet (100 mg total) by mouth daily.   [DISCONTINUED] montelukast (SINGULAIR) 10 MG tablet Take one tab po qd for sinus congestion   [DISCONTINUED] rosuvastatin (CRESTOR) 5 MG tablet TAKE 1 TABLET BY MOUTH DAILY   [DISCONTINUED] Semaglutide (  RYBELSUS) 7 MG TABS Take 7 mg by mouth daily.   losartan (COZAAR) 100 MG tablet Take 0.5 tablets (50 mg total) by mouth daily.   No facility-administered encounter medications on file as of 08/17/2021.      Medical History: Past Medical History:  Diagnosis Date   Arthritis    feet   Diabetes mellitus without complication (HCC)    Heart murmur    Hypertension    Thyroid disease      Vital Signs: BP (!) 144/98    Pulse 75    Temp 98.5 F (36.9 C)    Resp 16    Ht 5' 11"  (1.803 m)    Wt  255 lb 12.8 oz (116 kg)    SpO2 98%    BMI 35.68 kg/m    Review of Systems  Constitutional:  Positive for fatigue. Negative for chills and unexpected weight change.  HENT:  Positive for ear pain. Negative for congestion, rhinorrhea, sneezing and sore throat.   Eyes:  Negative for redness.  Respiratory:  Positive for chest tightness. Negative for cough, shortness of breath and wheezing.   Cardiovascular: Negative.  Negative for chest pain and palpitations.  Gastrointestinal:  Negative for abdominal pain, constipation, diarrhea, nausea and vomiting.  Genitourinary:  Negative for dysuria and frequency.  Musculoskeletal:  Negative for arthralgias, back pain, joint swelling and neck pain.  Skin:  Negative for rash.  Neurological:  Positive for dizziness, weakness and headaches. Negative for tremors and numbness.  Hematological:  Negative for adenopathy. Does not bruise/bleed easily.  Psychiatric/Behavioral: Negative.  Negative for behavioral problems (Depression), sleep disturbance and suicidal ideas. The patient is not nervous/anxious.    Physical Exam Vitals reviewed.  Constitutional:      General: He is not in acute distress.    Appearance: Normal appearance. He is obese. He is not ill-appearing.  HENT:     Head: Normocephalic and atraumatic.  Cardiovascular:     Rate and Rhythm: Normal rate and regular rhythm.  Pulmonary:     Effort: Pulmonary effort is normal. No respiratory distress.  Neurological:     Mental Status: He is alert and oriented to person, place, and time.     Cranial Nerves: No cranial nerve deficit.      Assessment/Plan: 1. Atypical chest pain EKG done in office today, showing sinus rhythm with nonspecific T-abnormality.  - EKG 12-Lead  2. Abnormal EKG Echocardiogram ordered to further evaluate cardiac function.  - ECHOCARDIOGRAM COMPLETE; Future  3. Essential hypertension Decreased losartan dose to 1/2 tablet daily. Instructed patient to take amlodipine  at night. The way he has been feeling may be due to his blood pressure improving due to recent weight loss so his blood pressure medication doses may need to be adjusted as he continued to lose weight. - losartan (COZAAR) 100 MG tablet; Take 0.5 tablets (50 mg total) by mouth daily.  Dispense: 90 tablet; Refill: 1  4. Type 2 diabetes mellitus with hyperglycemia, without long-term current use of insulin (HCC) Blood glucose levels have been stable, he has not had any hypoglycemic episodes.    General Counseling: Hiran verbalizes understanding of the findings of todays visit and agrees with plan of treatment. I have discussed any further diagnostic evaluation that may be needed or ordered today. We also reviewed his medications today. he has been encouraged to call the office with any questions or concerns that should arise related to todays visit.    Counseling:    Orders Placed This  Encounter  Procedures   EKG 12-Lead   ECHOCARDIOGRAM COMPLETE    Meds ordered this encounter  Medications   losartan (COZAAR) 100 MG tablet    Sig: Take 0.5 tablets (50 mg total) by mouth daily.    Dispense:  90 tablet    Refill:  1    Dose is decreased to 50 mg    Return in about 3 weeks (around 09/07/2021) for F/U, Echo @ [facility] P[facility].  [place] Controlled Substance Database was reviewed by me for overdose risk score (ORS)  Time spent:30 Minutes Time spent with patient included reviewing progress notes, labs, imaging studies, and discussing plan for follow up.   This patient was seen by Jonetta Osgood, FNP-C in collaboration with Dr. Clayborn Bigness as a part of collaborative care agreement.  Filbert Craze R. Valetta Fuller, MSN, FNP-C Internal Medicine

## 2021-08-31 ENCOUNTER — Ambulatory Visit: Payer: BC Managed Care – PPO

## 2021-08-31 ENCOUNTER — Other Ambulatory Visit: Payer: Self-pay

## 2021-08-31 DIAGNOSIS — R0602 Shortness of breath: Secondary | ICD-10-CM | POA: Diagnosis not present

## 2021-08-31 DIAGNOSIS — R0789 Other chest pain: Secondary | ICD-10-CM

## 2021-09-07 ENCOUNTER — Ambulatory Visit: Payer: BC Managed Care – PPO | Admitting: Nurse Practitioner

## 2021-09-08 ENCOUNTER — Other Ambulatory Visit: Payer: Self-pay

## 2021-09-08 ENCOUNTER — Ambulatory Visit: Payer: BC Managed Care – PPO | Admitting: Nurse Practitioner

## 2021-09-08 ENCOUNTER — Encounter: Payer: Self-pay | Admitting: Nurse Practitioner

## 2021-09-08 VITALS — BP 124/90 | HR 80 | Temp 98.2°F | Resp 16 | Ht 71.0 in | Wt 258.6 lb

## 2021-09-08 DIAGNOSIS — I10 Essential (primary) hypertension: Secondary | ICD-10-CM | POA: Diagnosis not present

## 2021-09-08 DIAGNOSIS — G8929 Other chronic pain: Secondary | ICD-10-CM

## 2021-09-08 DIAGNOSIS — E782 Mixed hyperlipidemia: Secondary | ICD-10-CM

## 2021-09-08 DIAGNOSIS — E1165 Type 2 diabetes mellitus with hyperglycemia: Secondary | ICD-10-CM

## 2021-09-08 DIAGNOSIS — M1A0720 Idiopathic chronic gout, left ankle and foot, without tophus (tophi): Secondary | ICD-10-CM

## 2021-09-08 DIAGNOSIS — E039 Hypothyroidism, unspecified: Secondary | ICD-10-CM

## 2021-09-08 DIAGNOSIS — M545 Low back pain, unspecified: Secondary | ICD-10-CM

## 2021-09-08 DIAGNOSIS — N522 Drug-induced erectile dysfunction: Secondary | ICD-10-CM

## 2021-09-08 DIAGNOSIS — J301 Allergic rhinitis due to pollen: Secondary | ICD-10-CM

## 2021-09-08 LAB — POCT GLYCOSYLATED HEMOGLOBIN (HGB A1C): Hemoglobin A1C: 6.1 % — AB (ref 4.0–5.6)

## 2021-09-08 MED ORDER — LEVOTHYROXINE SODIUM 50 MCG PO TABS: 50.0000 ug | ORAL_TABLET | Freq: Every day | ORAL | 1 refills | Status: DC

## 2021-09-08 MED ORDER — FEBUXOSTAT 40 MG PO TABS: 40.0000 mg | ORAL_TABLET | Freq: Every day | ORAL | 0 refills | Status: DC

## 2021-09-08 MED ORDER — MONTELUKAST SODIUM 10 MG PO TABS: ORAL_TABLET | ORAL | 1 refills | Status: DC

## 2021-09-08 MED ORDER — IBUPROFEN 800 MG PO TABS: 800.0000 mg | ORAL_TABLET | Freq: Three times a day (TID) | ORAL | 0 refills | Status: DC | PRN

## 2021-09-08 MED ORDER — DAPAGLIFLOZIN PROPANEDIOL 10 MG PO TABS: 10.0000 mg | ORAL_TABLET | Freq: Every day | ORAL | 0 refills | Status: DC

## 2021-09-08 MED ORDER — ROSUVASTATIN CALCIUM 5 MG PO TABS: 5.0000 mg | ORAL_TABLET | Freq: Every day | ORAL | 0 refills | Status: DC

## 2021-09-08 MED ORDER — SILDENAFIL CITRATE 100 MG PO TABS: 50.0000 mg | ORAL_TABLET | Freq: Every day | ORAL | 0 refills | Status: DC | PRN

## 2021-09-08 MED ORDER — HYDROCHLOROTHIAZIDE 25 MG PO TABS: ORAL_TABLET | ORAL | 1 refills | Status: DC

## 2021-09-08 MED ORDER — RYBELSUS 7 MG PO TABS: 7.0000 mg | ORAL_TABLET | Freq: Every day | ORAL | 0 refills | Status: DC

## 2021-09-08 NOTE — Progress Notes (Unsigned)
[facility] [place], [place] [address]  Internal MEDICINE  Office Visit Note  Patient Name: Ronald Mata  [id number]  [id number]  Date of Service: 09/08/2021  Chief Complaint  Patient presents with   Follow-up    Echo    Diabetes   Hypertension    HPI Olan presents for a follow-up visit for  Rells Sildenafil Stay on amlodipine.  A1c 6.1      Current Medication: Outpatient Encounter Medications as of 09/08/2021  Medication Sig   albuterol (VENTOLIN HFA) 108 (90 Base) MCG/ACT inhaler Inhale 2 puffs into the lungs every 6 (six) hours as needed for wheezing or shortness of breath.   amLODipine (NORVASC) 5 MG tablet Take 1 tablet (5 mg total) by mouth daily.   benzonatate (TESSALON) 100 MG capsule Take 1 capsule (100 mg total) by mouth 2 (two) times daily as needed for cough.   Blood Glucose Monitoring Suppl (CONTOUR NEXT MONITOR) w/Device KIT Use to check blood sugars twice a day E11.65   cetirizine (ZYRTEC) 10 MG tablet TAKE 1 TABLET(10 MG) BY MOUTH DAILY   chlorpheniramine-HYDROcodone (TUSSIONEX PENNKINETIC ER) 10-8 MG/5ML SUER Take 5 mLs by mouth every 12 (twelve) hours as needed for cough.   co-enzyme Q-10 30 MG capsule Take 30 mg by mouth daily.   ELDERBERRY PO Take by mouth daily.   FARXIGA 10 MG TABS tablet TAKE 1 TABLET BY MOUTH DAILY BEFORE BREAKFAST   febuxostat (ULORIC) 40 MG tablet Take 1 tablet (40 mg total) by mouth daily.   fluticasone (FLONASE) 50 MCG/ACT nasal spray USE 2 SPRAYS IN EACH NOSTRIL DAILY, SHAKE LIQUID   glucose blood (CONTOUR NEXT TEST) test strip Use as instructed to check blood sugars twice a day E11.65   hydrochlorothiazide (HYDRODIURIL) 25 MG tablet Take 1 tablet po QD   ibuprofen (ADVIL) 800 MG tablet Take 1 tablet (800 mg total) by mouth every 8 (eight) hours as needed.   Lancets (ONETOUCH ULTRASOFT) lancets Blood sugar testing daily - E11.65   levothyroxine (SYNTHROID) 50 MCG tablet Take 1 tablet (50 mcg total)  by mouth daily before breakfast.   losartan (COZAAR) 100 MG tablet Take 0.5 tablets (50 mg total) by mouth daily.   montelukast (SINGULAIR) 10 MG tablet Take one tab po qd for sinus congestion   rosuvastatin (CRESTOR) 5 MG tablet TAKE 1 TABLET BY MOUTH DAILY   Semaglutide (RYBELSUS) 7 MG TABS Take 7 mg by mouth daily.   tiZANidine (ZANAFLEX) 4 MG tablet TAKE 1 TABLET(4 MG) BY MOUTH AT BEDTIME AS NEEDED FOR MUSCLE SPASMS   [DISCONTINUED] azithromycin (ZITHROMAX Z-PAK) 250 MG tablet Use as directed for 5 days   No facility-administered encounter medications on file as of 09/08/2021.    Surgical History: Past Surgical History:  Procedure Laterality Date   CARDIAC CATHETERIZATION     15 years ago no stents   COLONOSCOPY WITH PROPOFOL N/A 10/15/2020   Procedure: COLONOSCOPY WITH PROPOFOL;  Surgeon: [name], MD;  Location: [facility];  Service: Endoscopy;  Laterality: N/A;  Diabetic   POLYPECTOMY  10/15/2020   Procedure: POLYPECTOMY;  Surgeon: [name], MD;  Location: [facility];  Service: Endoscopy;;   stomach wrap and achalasia      Medical History: Past Medical History:  Diagnosis Date   Arthritis    feet   Diabetes mellitus without complication ([place])    Heart murmur    Hypertension    Thyroid disease     Family History: Family  History  Problem Relation Age of Onset   Cancer Mother    Diabetes Mother    Hypertension Mother    Hyperlipidemia Mother    Stroke Mother    Osteoarthritis Mother    Diabetes Father    Osteoarthritis Father    Glaucoma Father     Social History   Socioeconomic History   Marital status: Married    Spouse name: Not on file   Number of children: Not on file   Years of education: Not on file   Highest education level: Not on file  Occupational History   Not on file  Tobacco Use   Smoking status: Never   Smokeless tobacco: Never  Vaping Use   Vaping Use: Never used  Substance and Sexual Activity   Alcohol use: No     Alcohol/week: 0.0 standard drinks   Drug use: No   Sexual activity: Not on file  Other Topics Concern   Not on file  Social History Narrative   Not on file   Social Determinants of Health   Financial Resource Strain: Not on file  Food Insecurity: Not on file  Transportation Needs: Not on file  Physical Activity: Not on file  Stress: Not on file  Social Connections: Not on file  Intimate Partner Violence: Not on file      Review of Systems  Vital Signs: BP 124/90    Pulse 80    Temp 98.2 F (36.8 C)    Resp 16    Ht _0  (1.803 m)    Wt 258 lb 9.6 oz (117.3 kg)    SpO2 97%    BMI 36.07 kg/m    Physical Exam     Assessment/Plan:   General Counseling: Halston verbalizes understanding of the findings of todays visit and agrees with plan of treatment. I have discussed any further diagnostic evaluation that may be needed or ordered today. We also reviewed his medications today. he has been encouraged to call the office with any questions or concerns that should arise related to todays visit.    Orders Placed This Encounter  Procedures   POCT HgB A1C    No orders of the defined types were placed in this encounter.   No follow-ups on file.   Total time spent:*** Minutes Time spent includes review of chart, medications, test results, and follow up plan with the patient.   [place] Controlled Substance Database was reviewed by me.  This patient was seen by Jonetta Osgood, FNP-C in collaboration with Dr. Clayborn Bigness as a part of collaborative care agreement.   Elazar Argabright R. Valetta Fuller, MSN, FNP-C Internal medicine

## 2021-09-09 ENCOUNTER — Other Ambulatory Visit: Payer: Self-pay | Admitting: Nurse Practitioner

## 2021-09-09 DIAGNOSIS — J301 Allergic rhinitis due to pollen: Secondary | ICD-10-CM

## 2021-09-10 ENCOUNTER — Other Ambulatory Visit: Payer: Self-pay | Admitting: Nurse Practitioner

## 2021-09-10 DIAGNOSIS — M1A0720 Idiopathic chronic gout, left ankle and foot, without tophus (tophi): Secondary | ICD-10-CM

## 2021-09-11 ENCOUNTER — Encounter: Payer: Self-pay | Admitting: Nurse Practitioner

## 2021-09-11 ENCOUNTER — Other Ambulatory Visit: Payer: Self-pay | Admitting: Internal Medicine

## 2021-09-11 DIAGNOSIS — E1165 Type 2 diabetes mellitus with hyperglycemia: Secondary | ICD-10-CM

## 2021-10-16 ENCOUNTER — Encounter: Payer: Self-pay | Admitting: Nurse Practitioner

## 2021-12-01 ENCOUNTER — Other Ambulatory Visit: Payer: Self-pay | Admitting: Nurse Practitioner

## 2021-12-01 DIAGNOSIS — J301 Allergic rhinitis due to pollen: Secondary | ICD-10-CM

## 2021-12-08 ENCOUNTER — Ambulatory Visit (INDEPENDENT_AMBULATORY_CARE_PROVIDER_SITE_OTHER): Payer: BC Managed Care – PPO | Admitting: Nurse Practitioner

## 2021-12-08 ENCOUNTER — Other Ambulatory Visit: Payer: Self-pay | Admitting: Nurse Practitioner

## 2021-12-08 ENCOUNTER — Encounter: Payer: Self-pay | Admitting: Nurse Practitioner

## 2021-12-08 VITALS — BP 130/90 | HR 67 | Temp 98.1°F | Resp 16 | Ht 71.0 in | Wt 253.6 lb

## 2021-12-08 DIAGNOSIS — E782 Mixed hyperlipidemia: Secondary | ICD-10-CM

## 2021-12-08 DIAGNOSIS — M7711 Lateral epicondylitis, right elbow: Secondary | ICD-10-CM

## 2021-12-08 DIAGNOSIS — E1165 Type 2 diabetes mellitus with hyperglycemia: Secondary | ICD-10-CM | POA: Diagnosis not present

## 2021-12-08 DIAGNOSIS — I10 Essential (primary) hypertension: Secondary | ICD-10-CM

## 2021-12-08 DIAGNOSIS — M1A0720 Idiopathic chronic gout, left ankle and foot, without tophus (tophi): Secondary | ICD-10-CM

## 2021-12-08 DIAGNOSIS — M7712 Lateral epicondylitis, left elbow: Secondary | ICD-10-CM

## 2021-12-08 DIAGNOSIS — N522 Drug-induced erectile dysfunction: Secondary | ICD-10-CM

## 2021-12-08 DIAGNOSIS — M545 Low back pain, unspecified: Secondary | ICD-10-CM

## 2021-12-08 DIAGNOSIS — G8929 Other chronic pain: Secondary | ICD-10-CM

## 2021-12-08 DIAGNOSIS — E039 Hypothyroidism, unspecified: Secondary | ICD-10-CM

## 2021-12-08 LAB — POCT GLYCOSYLATED HEMOGLOBIN (HGB A1C): Hemoglobin A1C: 6.2 % — AB (ref 4.0–5.6)

## 2021-12-08 MED ORDER — LEVOTHYROXINE SODIUM 50 MCG PO TABS: 50.0000 ug | ORAL_TABLET | Freq: Every day | ORAL | 1 refills | Status: DC

## 2021-12-08 MED ORDER — LOSARTAN POTASSIUM 100 MG PO TABS: 50.0000 mg | ORAL_TABLET | Freq: Every day | ORAL | 1 refills | Status: DC

## 2021-12-08 MED ORDER — DAPAGLIFLOZIN PROPANEDIOL 10 MG PO TABS: 10.0000 mg | ORAL_TABLET | Freq: Every day | ORAL | 0 refills | Status: DC

## 2021-12-08 MED ORDER — ONETOUCH ULTRASOFT LANCETS MISC: 12 refills | Status: DC

## 2021-12-08 MED ORDER — HYDROCHLOROTHIAZIDE 25 MG PO TABS: ORAL_TABLET | ORAL | 1 refills | Status: DC

## 2021-12-08 MED ORDER — CONTOUR NEXT TEST VI STRP: ORAL_STRIP | 12 refills | Status: DC

## 2021-12-08 MED ORDER — ROSUVASTATIN CALCIUM 5 MG PO TABS: 5.0000 mg | ORAL_TABLET | Freq: Every day | ORAL | 1 refills | Status: DC

## 2021-12-08 MED ORDER — SILDENAFIL CITRATE 100 MG PO TABS: 50.0000 mg | ORAL_TABLET | Freq: Every day | ORAL | 0 refills | Status: DC | PRN

## 2021-12-08 MED ORDER — AMLODIPINE BESYLATE 5 MG PO TABS: 5.0000 mg | ORAL_TABLET | Freq: Every day | ORAL | 1 refills | Status: DC

## 2021-12-08 MED ORDER — FEBUXOSTAT 40 MG PO TABS
40.0000 mg | ORAL_TABLET | Freq: Every day | ORAL | 0 refills | Status: DC
Start: 2021-12-08 — End: 2022-03-11

## 2021-12-08 MED ORDER — IBUPROFEN 800 MG PO TABS: 800.0000 mg | ORAL_TABLET | Freq: Three times a day (TID) | ORAL | 0 refills | Status: DC | PRN

## 2021-12-08 MED ORDER — METHOCARBAMOL 500 MG PO TABS
500.0000 mg | ORAL_TABLET | Freq: Three times a day (TID) | ORAL | 0 refills | Status: DC | PRN
Start: 2021-12-08 — End: 2024-05-06

## 2021-12-08 MED ORDER — RYBELSUS 7 MG PO TABS: 7.0000 mg | ORAL_TABLET | Freq: Every day | ORAL | 0 refills | Status: DC

## 2021-12-08 NOTE — Progress Notes (Signed)
[facility], [place] [address]  Internal MEDICINE  Office Visit Note  Patient Name: Ronald Mata  [id number]  [id number]  Date of Service: 12/08/2021  Chief Complaint  Patient presents with   Follow-up   Knee Pain    Right, noticed it about 2 weeks ago, swelling has gone down, some weakness   Back Pain    Bulging disk pinching nerve and effecting right leg   Medication Refill   Diabetes   Hypertension    HPI Ronald Mata presents for a follow-up visit for knee and back pain, diabetes, hypertension and medication refills.  He has right knee pain which he noticed about 2 weeks ago, the swelling has improved except he has noticed some weakness in the right knee.  He has back pain as well with a bulging disc and pinched nerve which is causing sciatica on the right side. Thinking about trying natural remedy for joint pain termuric may also lower blood sugar.  A1c 6.2 remaining stable, starting to workout, energy level is good.  His blood pressure is also stable on current medications.  He is also lost 5 pounds since his previous office visit   Current Medication: Outpatient Encounter Medications as of 12/08/2021  Medication Sig   albuterol (VENTOLIN HFA) 108 (90 Base) MCG/ACT inhaler Inhale 2 puffs into the lungs every 6 (six) hours as needed for wheezing or shortness of breath.   Blood Glucose Monitoring Suppl (CONTOUR NEXT MONITOR) w/Device KIT Use to check blood sugars twice a day E11.65   cetirizine (ZYRTEC) 10 MG tablet TAKE 1 TABLET(10 MG) BY MOUTH DAILY   co-enzyme Q-10 30 MG capsule Take 30 mg by mouth daily.   ELDERBERRY PO Take by mouth daily.   fluticasone (FLONASE) 50 MCG/ACT nasal spray USE 2 SPRAYS IN EACH NOSTRIL DAILY, SHAKE LIQUID   methocarbamol (ROBAXIN) 500 MG tablet Take 1 tablet (500 mg total) by mouth every 8 (eight) hours as needed for muscle spasms.   montelukast (SINGULAIR) 10 MG tablet Take one tab po qd for sinus congestion    tiZANidine (ZANAFLEX) 4 MG tablet TAKE 1 TABLET(4 MG) BY MOUTH AT BEDTIME AS NEEDED FOR MUSCLE SPASMS   [DISCONTINUED] amLODipine (NORVASC) 5 MG tablet Take 1 tablet (5 mg total) by mouth daily.   [DISCONTINUED] benzonatate (TESSALON) 100 MG capsule Take 1 capsule (100 mg total) by mouth 2 (two) times daily as needed for cough.   [DISCONTINUED] chlorpheniramine-HYDROcodone (TUSSIONEX PENNKINETIC ER) 10-8 MG/5ML SUER Take 5 mLs by mouth every 12 (twelve) hours as needed for cough.   [DISCONTINUED] dapagliflozin propanediol (FARXIGA) 10 MG TABS tablet Take 1 tablet (10 mg total) by mouth daily before breakfast.   [DISCONTINUED] febuxostat (ULORIC) 40 MG tablet Take 1 tablet (40 mg total) by mouth daily.   [DISCONTINUED] glucose blood (CONTOUR NEXT TEST) test strip Use as instructed to check blood sugars twice a day E11.65   [DISCONTINUED] hydrochlorothiazide (HYDRODIURIL) 25 MG tablet Take 1 tablet po QD   [DISCONTINUED] ibuprofen (ADVIL) 800 MG tablet Take 1 tablet (800 mg total) by mouth every 8 (eight) hours as needed.   [DISCONTINUED] Lancets (ONETOUCH ULTRASOFT) lancets Blood sugar testing daily - E11.65   [DISCONTINUED] levothyroxine (SYNTHROID) 50 MCG tablet Take 1 tablet (50 mcg total) by mouth daily before breakfast.   [DISCONTINUED] losartan (COZAAR) 100 MG tablet Take 0.5 tablets (50 mg total) by mouth daily.   [DISCONTINUED] rosuvastatin (CRESTOR) 5 MG tablet Take 1 tablet (5 mg total) by mouth  daily.   [DISCONTINUED] Semaglutide (RYBELSUS) 7 MG TABS Take 7 mg by mouth daily.   [DISCONTINUED] sildenafil (VIAGRA) 100 MG tablet Take 0.5-1 tablets (50-100 mg total) by mouth daily as needed for erectile dysfunction.   amLODipine (NORVASC) 5 MG tablet Take 1 tablet (5 mg total) by mouth daily.   dapagliflozin propanediol (FARXIGA) 10 MG TABS tablet Take 1 tablet (10 mg total) by mouth daily before breakfast.   febuxostat (ULORIC) 40 MG tablet Take 1 tablet (40 mg total) by mouth daily.    glucose blood (CONTOUR NEXT TEST) test strip Use as instructed to check blood sugars twice a day E11.65   hydrochlorothiazide (HYDRODIURIL) 25 MG tablet Take 1 tablet po QD   ibuprofen (ADVIL) 800 MG tablet Take 1 tablet (800 mg total) by mouth every 8 (eight) hours as needed.   Lancets (ONETOUCH ULTRASOFT) lancets Blood sugar testing daily - E11.65   levothyroxine (SYNTHROID) 50 MCG tablet Take 1 tablet (50 mcg total) by mouth daily before breakfast.   losartan (COZAAR) 100 MG tablet Take 0.5 tablets (50 mg total) by mouth daily.   rosuvastatin (CRESTOR) 5 MG tablet Take 1 tablet (5 mg total) by mouth daily.   Semaglutide (RYBELSUS) 7 MG TABS Take 7 mg by mouth daily.   sildenafil (VIAGRA) 100 MG tablet Take 0.5-1 tablets (50-100 mg total) by mouth daily as needed for erectile dysfunction.   No facility-administered encounter medications on file as of 12/08/2021.    Surgical History: Past Surgical History:  Procedure Laterality Date   CARDIAC CATHETERIZATION     15 years ago no stents   COLONOSCOPY WITH PROPOFOL N/A 10/15/2020   Procedure: COLONOSCOPY WITH PROPOFOL;  Surgeon: [name], MD;  Location: [facility];  Service: Endoscopy;  Laterality: N/A;  Diabetic   POLYPECTOMY  10/15/2020   Procedure: POLYPECTOMY;  Surgeon: [name], MD;  Location: [facility];  Service: Endoscopy;;   stomach wrap and achalasia      Medical History: Past Medical History:  Diagnosis Date   Arthritis    feet   Diabetes mellitus without complication ([place])    Heart murmur    Hypertension    Thyroid disease     Family History: Family History  Problem Relation Age of Onset   Cancer Mother    Diabetes Mother    Hypertension Mother    Hyperlipidemia Mother    Stroke Mother    Osteoarthritis Mother    Diabetes Father    Osteoarthritis Father    Glaucoma Father     Social History   Socioeconomic History   Marital status: Married    Spouse name: Not on file   Number of  children: Not on file   Years of education: Not on file   Highest education level: Not on file  Occupational History   Not on file  Tobacco Use   Smoking status: Never   Smokeless tobacco: Never  Vaping Use   Vaping Use: Never used  Substance and Sexual Activity   Alcohol use: No    Alcohol/week: 0.0 standard drinks   Drug use: No   Sexual activity: Not on file  Other Topics Concern   Not on file  Social History Narrative   Not on file   Social Determinants of Health   Financial Resource Strain: Not on file  Food Insecurity: Not on file  Transportation Needs: Not on file  Physical Activity: Not on file  Stress: Not on file  Social Connections: Not on  file  Intimate Partner Violence: Not on file      Review of Systems  Constitutional:  Negative for chills, fatigue and unexpected weight change.  HENT:  Negative for congestion, rhinorrhea, sneezing and sore throat.   Eyes:  Negative for redness.  Respiratory: Negative.  Negative for cough, chest tightness and shortness of breath.   Cardiovascular: Negative.  Negative for chest pain and palpitations.  Gastrointestinal:  Negative for abdominal pain, constipation, diarrhea, nausea and vomiting.  Genitourinary:  Negative for dysuria and frequency.  Musculoskeletal:  Positive for arthralgias and back pain. Negative for joint swelling and neck pain.       Bilateral elbow pain that radiates down his arm to his hand on both sides, weakened grip strength and muscle spasms  Skin:  Negative for rash.  Neurological: Negative.  Negative for tremors and numbness.  Hematological:  Negative for adenopathy. Does not bruise/bleed easily.  Psychiatric/Behavioral:  Negative for behavioral problems (Depression), sleep disturbance and suicidal ideas. The patient is not nervous/anxious.     Vital Signs: BP 130/90   Pulse 67   Temp 98.1 F (36.7 C)   Resp 16   Ht 5' 11"  (1.803 m)   Wt 253 lb 9.6 oz (115 kg)   SpO2 98%   BMI 35.37 kg/m     Physical Exam Vitals reviewed.  Constitutional:      General: He is not in acute distress.    Appearance: Normal appearance. He is obese. He is not ill-appearing.  HENT:     Head: Normocephalic and atraumatic.  Eyes:     Pupils: Pupils are equal, round, and reactive to light.  Cardiovascular:     Rate and Rhythm: Normal rate and regular rhythm.  Pulmonary:     Effort: Pulmonary effort is normal. No respiratory distress.  Musculoskeletal:     Right elbow: Decreased range of motion. Tenderness present in lateral epicondyle.     Left elbow: Decreased range of motion. Tenderness present in lateral epicondyle.     Right hand: Decreased range of motion. Decreased strength.     Left hand: Decreased range of motion. Decreased strength.     Comments: Weakened grip strength of hands  Neurological:     Mental Status: He is alert and oriented to person, place, and time.  Psychiatric:        Mood and Affect: Mood normal.        Behavior: Behavior normal.        Assessment/Plan: 1. Type 2 diabetes mellitus with hyperglycemia, without long-term current use of insulin (HCC) A1c is 6.2, patient is doing well and tolerating medications, medication refills ordered. - POCT HgB A1C - Semaglutide (RYBELSUS) 7 MG TABS; Take 7 mg by mouth daily.  Dispense: 90 tablet; Refill: 0 - dapagliflozin propanediol (FARXIGA) 10 MG TABS tablet; Take 1 tablet (10 mg total) by mouth daily before breakfast.  Dispense: 90 tablet; Refill: 0 - Lancets (ONETOUCH ULTRASOFT) lancets; Blood sugar testing daily - E11.65  Dispense: 100 each; Refill: 12 - glucose blood (CONTOUR NEXT TEST) test strip; Use as instructed to check blood sugars twice a day E11.65  Dispense: 200 each; Refill: 12  2. Essential hypertension Stable with current medications, refills ordered. - amLODipine (NORVASC) 5 MG tablet; Take 1 tablet (5 mg total) by mouth daily.  Dispense: 90 tablet; Refill: 1 - losartan (COZAAR) 100 MG tablet; Take 0.5  tablets (50 mg total) by mouth daily.  Dispense: 90 tablet; Refill: 1 - hydrochlorothiazide (HYDRODIURIL) 25 MG  tablet; Take 1 tablet po QD  Dispense: 90 tablet; Refill: 1  3. Lateral epicondylitis of both elbows New finding, prescribed a muscle accident to help with the musculoskeletal pain and may continue taking ibuprofen to decrease inflammation and pain, information about epicondylitis provided to patient as well as stretches that will help improve strength and pain - ibuprofen (ADVIL) 800 MG tablet; Take 1 tablet (800 mg total) by mouth every 8 (eight) hours as needed.  Dispense: 90 tablet; Refill: 0 - methocarbamol (ROBAXIN) 500 MG tablet; Take 1 tablet (500 mg total) by mouth every 8 (eight) hours as needed for muscle spasms.  Dispense: 90 tablet; Refill: 0  4. Mixed hyperlipidemia Continue rosuvastatin as prescribed, refills ordered. - rosuvastatin (CRESTOR) 5 MG tablet; Take 1 tablet (5 mg total) by mouth daily.  Dispense: 90 tablet; Refill: 1  5. Acquired hypothyroidism Stable with current dose, refills ordered - levothyroxine (SYNTHROID) 50 MCG tablet; Take 1 tablet (50 mcg total) by mouth daily before breakfast.  Dispense: 90 tablet; Refill: 1  6. Drug-induced erectile dysfunction Sildenafil refilled - sildenafil (VIAGRA) 100 MG tablet; Take 0.5-1 tablets (50-100 mg total) by mouth daily as needed for erectile dysfunction.  Dispense: 10 tablet; Refill: 0  7. Chronic midline low back pain without sciatica Ibuprofen refilled - ibuprofen (ADVIL) 800 MG tablet; Take 1 tablet (800 mg total) by mouth every 8 (eight) hours as needed.  Dispense: 90 tablet; Refill: 0  8. Chronic idiopathic gout involving toe of left foot without tophus Uloric refilled - febuxostat (ULORIC) 40 MG tablet; Take 1 tablet (40 mg total) by mouth daily.  Dispense: 90 tablet; Refill: 0   General Counseling: Ronald Mata verbalizes understanding of the findings of todays visit and agrees with plan of treatment. I  have discussed any further diagnostic evaluation that may be needed or ordered today. We also reviewed his medications today. he has been encouraged to call the office with any questions or concerns that should arise related to todays visit.    Orders Placed This Encounter  Procedures   POCT HgB A1C    Meds ordered this encounter  Medications   amLODipine (NORVASC) 5 MG tablet    Sig: Take 1 tablet (5 mg total) by mouth daily.    Dispense:  90 tablet    Refill:  1    For future refills   rosuvastatin (CRESTOR) 5 MG tablet    Sig: Take 1 tablet (5 mg total) by mouth daily.    Dispense:  90 tablet    Refill:  1    For future refills   levothyroxine (SYNTHROID) 50 MCG tablet    Sig: Take 1 tablet (50 mcg total) by mouth daily before breakfast.    Dispense:  90 tablet    Refill:  1   sildenafil (VIAGRA) 100 MG tablet    Sig: Take 0.5-1 tablets (50-100 mg total) by mouth daily as needed for erectile dysfunction.    Dispense:  10 tablet    Refill:  0   losartan (COZAAR) 100 MG tablet    Sig: Take 0.5 tablets (50 mg total) by mouth daily.    Dispense:  90 tablet    Refill:  1    Dose is decreased to 50 mg, for future refills.   Semaglutide (RYBELSUS) 7 MG TABS    Sig: Take 7 mg by mouth daily.    Dispense:  90 tablet    Refill:  0   ibuprofen (ADVIL) 800 MG tablet  Sig: Take 1 tablet (800 mg total) by mouth every 8 (eight) hours as needed.    Dispense:  90 tablet    Refill:  0   dapagliflozin propanediol (FARXIGA) 10 MG TABS tablet    Sig: Take 1 tablet (10 mg total) by mouth daily before breakfast.    Dispense:  90 tablet    Refill:  0   febuxostat (ULORIC) 40 MG tablet    Sig: Take 1 tablet (40 mg total) by mouth daily.    Dispense:  90 tablet    Refill:  0    Pt needs appt for future refills   Lancets (ONETOUCH ULTRASOFT) lancets    Sig: Blood sugar testing daily - E11.65    Dispense:  100 each    Refill:  12   hydrochlorothiazide (HYDRODIURIL) 25 MG tablet     Sig: Take 1 tablet po QD    Dispense:  90 tablet    Refill:  1    Please fill as 90 day prescription.   glucose blood (CONTOUR NEXT TEST) test strip    Sig: Use as instructed to check blood sugars twice a day E11.65    Dispense:  200 each    Refill:  12   methocarbamol (ROBAXIN) 500 MG tablet    Sig: Take 1 tablet (500 mg total) by mouth every 8 (eight) hours as needed for muscle spasms.    Dispense:  90 tablet    Refill:  0    epicondylitis    Return in about 3 months (around 03/10/2022) for F/U, Recheck A1C, Stanislaus Kaltenbach PCP.   Total time spent:30 Minutes Time spent includes review of chart, medications, test results, and follow up plan with the patient.   [place] Controlled Substance Database was reviewed by me.  This patient was seen by Jonetta Osgood, FNP-C in collaboration with Dr. Clayborn Bigness as a part of collaborative care agreement.   Jamere Stidham R. Valetta Fuller, MSN, FNP-C Internal medicine

## 2021-12-09 ENCOUNTER — Other Ambulatory Visit: Payer: Self-pay | Admitting: Nurse Practitioner

## 2021-12-09 DIAGNOSIS — E1165 Type 2 diabetes mellitus with hyperglycemia: Secondary | ICD-10-CM

## 2022-01-29 ENCOUNTER — Encounter: Payer: Self-pay | Admitting: Nurse Practitioner

## 2022-01-30 DIAGNOSIS — R42 Dizziness and giddiness: Secondary | ICD-10-CM | POA: Diagnosis not present

## 2022-01-30 DIAGNOSIS — H6060 Unspecified chronic otitis externa, unspecified ear: Secondary | ICD-10-CM | POA: Diagnosis not present

## 2022-01-31 ENCOUNTER — Other Ambulatory Visit: Payer: Self-pay | Admitting: Otolaryngology

## 2022-01-31 DIAGNOSIS — R42 Dizziness and giddiness: Secondary | ICD-10-CM

## 2022-02-16 ENCOUNTER — Ambulatory Visit
Admission: RE | Admit: 2022-02-16 | Discharge: 2022-02-16 | Disposition: A | Discharge status: Home / Self Care | Payer: BC Managed Care – PPO | Source: Ambulatory Visit | Attending: Otolaryngology | Admitting: Otolaryngology

## 2022-02-16 DIAGNOSIS — R42 Dizziness and giddiness: Secondary | ICD-10-CM | POA: Diagnosis not present

## 2022-02-16 DIAGNOSIS — J341 Cyst and mucocele of nose and nasal sinus: Secondary | ICD-10-CM | POA: Diagnosis not present

## 2022-02-16 DIAGNOSIS — J329 Chronic sinusitis, unspecified: Secondary | ICD-10-CM | POA: Diagnosis not present

## 2022-02-16 DIAGNOSIS — J392 Other diseases of pharynx: Secondary | ICD-10-CM | POA: Diagnosis not present

## 2022-02-16 MED ORDER — GADOBENATE DIMEGLUMINE 529 MG/ML IV SOLN
20.0000 mL | Freq: Once | INTRAVENOUS | Status: AC | PRN
  Administered 2022-02-16: 20 mL via INTRAVENOUS

## 2022-02-26 ENCOUNTER — Other Ambulatory Visit: Payer: Self-pay | Admitting: Internal Medicine

## 2022-02-26 DIAGNOSIS — J301 Allergic rhinitis due to pollen: Secondary | ICD-10-CM

## 2022-03-06 ENCOUNTER — Encounter: Payer: BC Managed Care – PPO | Admitting: Nurse Practitioner

## 2022-03-08 ENCOUNTER — Telehealth: Payer: Self-pay

## 2022-03-08 NOTE — Telephone Encounter (Signed)
Left vm and sent mychart message to confirm 03/15/22 appointment-Toni

## 2022-03-09 ENCOUNTER — Ambulatory Visit: Payer: BC Managed Care – PPO | Admitting: Nurse Practitioner

## 2022-03-09 ENCOUNTER — Other Ambulatory Visit: Payer: Self-pay | Admitting: Nurse Practitioner

## 2022-03-09 DIAGNOSIS — E1165 Type 2 diabetes mellitus with hyperglycemia: Secondary | ICD-10-CM

## 2022-03-11 ENCOUNTER — Other Ambulatory Visit: Payer: Self-pay | Admitting: Nurse Practitioner

## 2022-03-11 DIAGNOSIS — M1A0720 Idiopathic chronic gout, left ankle and foot, without tophus (tophi): Secondary | ICD-10-CM

## 2022-03-15 ENCOUNTER — Encounter: Payer: Self-pay | Admitting: Nurse Practitioner

## 2022-03-15 ENCOUNTER — Ambulatory Visit (INDEPENDENT_AMBULATORY_CARE_PROVIDER_SITE_OTHER): Payer: BC Managed Care – PPO | Admitting: Nurse Practitioner

## 2022-03-15 VITALS — BP 132/85 | HR 73 | Temp 98.3°F | Resp 16 | Ht 71.0 in | Wt 252.2 lb

## 2022-03-15 DIAGNOSIS — E039 Hypothyroidism, unspecified: Secondary | ICD-10-CM

## 2022-03-15 DIAGNOSIS — J301 Allergic rhinitis due to pollen: Secondary | ICD-10-CM

## 2022-03-15 DIAGNOSIS — E559 Vitamin D deficiency, unspecified: Secondary | ICD-10-CM | POA: Diagnosis not present

## 2022-03-15 DIAGNOSIS — R300 Dysuria: Secondary | ICD-10-CM

## 2022-03-15 DIAGNOSIS — Z760 Encounter for issue of repeat prescription: Secondary | ICD-10-CM

## 2022-03-15 DIAGNOSIS — Z0001 Encounter for general adult medical examination with abnormal findings: Secondary | ICD-10-CM | POA: Diagnosis not present

## 2022-03-15 DIAGNOSIS — I10 Essential (primary) hypertension: Secondary | ICD-10-CM

## 2022-03-15 DIAGNOSIS — M1A0720 Idiopathic chronic gout, left ankle and foot, without tophus (tophi): Secondary | ICD-10-CM

## 2022-03-15 DIAGNOSIS — E1165 Type 2 diabetes mellitus with hyperglycemia: Secondary | ICD-10-CM

## 2022-03-15 DIAGNOSIS — E782 Mixed hyperlipidemia: Secondary | ICD-10-CM

## 2022-03-15 LAB — POCT GLYCOSYLATED HEMOGLOBIN (HGB A1C): Hemoglobin A1C: 6.2 % — AB (ref 4.0–5.6)

## 2022-03-15 MED ORDER — FEBUXOSTAT 40 MG PO TABS: ORAL_TABLET | ORAL | 3 refills | Status: DC

## 2022-03-15 MED ORDER — LOSARTAN POTASSIUM 100 MG PO TABS: 100.0000 mg | ORAL_TABLET | Freq: Every day | ORAL | 3 refills | Status: DC

## 2022-03-15 MED ORDER — AMLODIPINE BESYLATE 5 MG PO TABS: 5.0000 mg | ORAL_TABLET | Freq: Every day | ORAL | 3 refills | Status: DC

## 2022-03-15 MED ORDER — ROSUVASTATIN CALCIUM 5 MG PO TABS: 5.0000 mg | ORAL_TABLET | Freq: Every day | ORAL | 3 refills | Status: DC

## 2022-03-15 MED ORDER — HYDROCHLOROTHIAZIDE 25 MG PO TABS: ORAL_TABLET | ORAL | 3 refills | Status: DC

## 2022-03-15 MED ORDER — LEVOTHYROXINE SODIUM 50 MCG PO TABS: 50.0000 ug | ORAL_TABLET | Freq: Every day | ORAL | 3 refills | Status: DC

## 2022-03-15 MED ORDER — DAPAGLIFLOZIN PROPANEDIOL 10 MG PO TABS: 10.0000 mg | ORAL_TABLET | Freq: Every day | ORAL | 3 refills | Status: DC

## 2022-03-15 MED ORDER — MONTELUKAST SODIUM 10 MG PO TABS: ORAL_TABLET | ORAL | 3 refills | Status: DC

## 2022-03-15 MED ORDER — RYBELSUS 7 MG PO TABS: 1.0000 | ORAL_TABLET | Freq: Every day | ORAL | 3 refills | Status: DC

## 2022-03-15 MED ORDER — ONETOUCH ULTRASOFT LANCETS MISC: 12 refills | Status: DC

## 2022-03-15 MED ORDER — CETIRIZINE HCL 10 MG PO TABS: ORAL_TABLET | ORAL | 3 refills | Status: DC

## 2022-03-15 NOTE — Progress Notes (Signed)
[facility] [place], [place] [address]  Internal MEDICINE  Office Visit Note  Patient Name: Ronald Mata  [id number]  [id number]  Date of Service: 03/15/2022  Chief Complaint  Patient presents with   Annual Exam   Hypertension   Diabetes   Medication Refill   Quality Metric Gaps    Shingles Vaccine   Ear Problem    Pt sees ENT every 6 months for ear infections - has been experiencing some dizziness recently upon weather changes, ENT did not find a cause, even did an MRI, but no findings    HPI Ronald Mata presents for an annual well visit and physical exam.  Well appearing 51 year old male with arthritis, diabetes, hypertension, hypothyroidism, and a heart murmur.  -- works full time at [employer]. Lives at home with wife --screening colonoscopy done in march 2022, due in 2027. --due for routine labs now, but PSA was good last year so this can be readdressed in 1-3 years. ---BP and other vital signs are stable and controlled today --A1c 6.2, stable, no change from A1c in may. Weight on scale at home was 245 lbs. Reports that he has been eating better and losing weight.  Eating better Weight is 245 lbs at home.  --see Dr. Pryor Ochoa, ENT for recurrent ear infections and dizzy spells, MRI was done, no specific cause was found.    Current Medication: Outpatient Encounter Medications as of 03/15/2022  Medication Sig   albuterol (VENTOLIN HFA) 108 (90 Base) MCG/ACT inhaler Inhale 2 puffs into the lungs every 6 (six) hours as needed for wheezing or shortness of breath.   co-enzyme Q-10 30 MG capsule Take 30 mg by mouth daily.   ELDERBERRY PO Take by mouth daily.   fluticasone (FLONASE) 50 MCG/ACT nasal spray SHAKE LIQUID AND USE 2 SPRAYS IN EACH NOSTRIL DAILY   ibuprofen (ADVIL) 800 MG tablet Take 1 tablet (800 mg total) by mouth every 8 (eight) hours as needed.   methocarbamol (ROBAXIN) 500 MG tablet Take 1 tablet (500 mg total) by mouth every 8 (eight) hours as needed  for muscle spasms.   sildenafil (VIAGRA) 100 MG tablet Take 0.5-1 tablets (50-100 mg total) by mouth daily as needed for erectile dysfunction.   [DISCONTINUED] amLODipine (NORVASC) 5 MG tablet Take 1 tablet (5 mg total) by mouth daily.   [DISCONTINUED] Blood Glucose Monitoring Suppl (CONTOUR NEXT MONITOR) w/Device KIT Use to check blood sugars twice a day E11.65   [DISCONTINUED] cetirizine (ZYRTEC) 10 MG tablet TAKE 1 TABLET(10 MG) BY MOUTH DAILY   [DISCONTINUED] dapagliflozin propanediol (FARXIGA) 10 MG TABS tablet Take 1 tablet (10 mg total) by mouth daily before breakfast.   [DISCONTINUED] febuxostat (ULORIC) 40 MG tablet TAKE 1 TABLET(40 MG) BY MOUTH DAILY   [DISCONTINUED] glucose blood (CONTOUR NEXT TEST) test strip Use as instructed to check blood sugars twice a day E11.65   [DISCONTINUED] hydrochlorothiazide (HYDRODIURIL) 25 MG tablet Take 1 tablet po QD   [DISCONTINUED] Lancets (ONETOUCH ULTRASOFT) lancets Blood sugar testing daily - E11.65   [DISCONTINUED] levothyroxine (SYNTHROID) 50 MCG tablet Take 1 tablet (50 mcg total) by mouth daily before breakfast.   [DISCONTINUED] losartan (COZAAR) 100 MG tablet Take 0.5 tablets (50 mg total) by mouth daily.   [DISCONTINUED] montelukast (SINGULAIR) 10 MG tablet Take one tab po qd for sinus congestion   [DISCONTINUED] rosuvastatin (CRESTOR) 5 MG tablet Take 1 tablet (5 mg total) by mouth daily.   [DISCONTINUED] RYBELSUS 7 MG TABS TAKE 1  TABLET BY MOUTH DAILY   [DISCONTINUED] tiZANidine (ZANAFLEX) 4 MG tablet TAKE 1 TABLET(4 MG) BY MOUTH AT BEDTIME AS NEEDED FOR MUSCLE SPASMS   amLODipine (NORVASC) 5 MG tablet Take 1 tablet (5 mg total) by mouth daily.   cetirizine (ZYRTEC) 10 MG tablet TAKE 1 TABLET(10 MG) BY MOUTH DAILY   dapagliflozin propanediol (FARXIGA) 10 MG TABS tablet Take 1 tablet (10 mg total) by mouth daily before breakfast.   febuxostat (ULORIC) 40 MG tablet TAKE 1 TABLET(40 MG) BY MOUTH DAILY   hydrochlorothiazide (HYDRODIURIL) 25 MG  tablet Take 1 tablet po QD   Lancets (ONETOUCH ULTRASOFT) lancets Blood sugar testing daily - E11.65   levothyroxine (SYNTHROID) 50 MCG tablet Take 1 tablet (50 mcg total) by mouth daily before breakfast.   losartan (COZAAR) 100 MG tablet Take 1 tablet (100 mg total) by mouth daily.   montelukast (SINGULAIR) 10 MG tablet Take one tab po qd for sinus congestion   rosuvastatin (CRESTOR) 5 MG tablet Take 1 tablet (5 mg total) by mouth daily.   Semaglutide (RYBELSUS) 7 MG TABS Take 1 tablet by mouth daily.   No facility-administered encounter medications on file as of 03/15/2022.    Surgical History: Past Surgical History:  Procedure Laterality Date   CARDIAC CATHETERIZATION     15 years ago no stents   COLONOSCOPY WITH PROPOFOL N/A 10/15/2020   Procedure: COLONOSCOPY WITH PROPOFOL;  Surgeon: [name], MD;  Location: [facility];  Service: Endoscopy;  Laterality: N/A;  Diabetic   POLYPECTOMY  10/15/2020   Procedure: POLYPECTOMY;  Surgeon: [name], MD;  Location: [facility];  Service: Endoscopy;;   stomach wrap and achalasia      Medical History: Past Medical History:  Diagnosis Date   Arthritis    feet   Diabetes mellitus without complication ([place])    Heart murmur    Hypertension    Thyroid disease     Family History: Family History  Problem Relation Age of Onset   Cancer Mother    Diabetes Mother    Hypertension Mother    Hyperlipidemia Mother    Stroke Mother    Osteoarthritis Mother    Diabetes Father    Osteoarthritis Father    Glaucoma Father     Social History   Socioeconomic History   Marital status: Married    Spouse name: Not on file   Number of children: Not on file   Years of education: Not on file   Highest education level: Not on file  Occupational History   Not on file  Tobacco Use   Smoking status: Never   Smokeless tobacco: Never  Vaping Use   Vaping Use: Never used  Substance and Sexual Activity   Alcohol use: No     Alcohol/week: 0.0 standard drinks of alcohol   Drug use: No   Sexual activity: Not on file  Other Topics Concern   Not on file  Social History Narrative   Not on file   Social Determinants of Health   Financial Resource Strain: Not on file  Food Insecurity: Not on file  Transportation Needs: Not on file  Physical Activity: Not on file  Stress: Not on file  Social Connections: Not on file  Intimate Partner Violence: Not on file      Review of Systems  Constitutional:  Negative for activity change, appetite change, chills, fatigue, fever and unexpected weight change.  HENT: Negative.  Negative for congestion, ear pain, rhinorrhea, sore throat and  trouble swallowing.   Eyes: Negative.   Respiratory: Negative.  Negative for cough, chest tightness, shortness of breath and wheezing.   Cardiovascular: Negative.  Negative for chest pain.  Gastrointestinal: Negative.  Negative for abdominal pain, blood in stool, constipation, diarrhea, nausea and vomiting.  Endocrine: Negative.   Genitourinary: Negative.  Negative for difficulty urinating, dysuria, frequency, hematuria and urgency.  Musculoskeletal: Negative.  Negative for arthralgias, back pain, joint swelling, myalgias and neck pain.  Skin: Negative.  Negative for rash and wound.  Allergic/Immunologic: Negative.  Negative for immunocompromised state.  Neurological: Negative.  Negative for dizziness, seizures, numbness and headaches.  Hematological: Negative.   Psychiatric/Behavioral: Negative.  Negative for behavioral problems, self-injury and suicidal ideas. The patient is not nervous/anxious.     Vital Signs: BP 132/85   Pulse 73   Temp 98.3 F (36.8 C)   Resp 16   Ht $R'5\' 11"'pr$  (1.803 m)   Wt 252 lb 3.2 oz (114.4 kg)   SpO2 97%   BMI 35.17 kg/m    Physical Exam Vitals reviewed.  Constitutional:      General: He is awake. He is not in acute distress.    Appearance: Normal appearance. He is well-developed and  well-groomed. He is obese. He is not ill-appearing or diaphoretic.  HENT:     Head: Normocephalic and atraumatic.     Right Ear: Tympanic membrane, ear canal and external ear normal.     Left Ear: Tympanic membrane, ear canal and external ear normal.     Nose: Nose normal. No congestion or rhinorrhea.     Mouth/Throat:     Lips: Pink.     Mouth: Mucous membranes are moist.     Pharynx: Oropharynx is clear. Uvula midline. No oropharyngeal exudate or posterior oropharyngeal erythema.  Eyes:     General: Lids are normal. Vision grossly intact. Gaze aligned appropriately. No scleral icterus.       Right eye: No discharge.        Left eye: No discharge.     Extraocular Movements: Extraocular movements intact.     Conjunctiva/sclera: Conjunctivae normal.     Pupils: Pupils are equal, round, and reactive to light.     Funduscopic exam:    Right eye: Red reflex present.        Left eye: Red reflex present. Neck:     Thyroid: No thyromegaly.     Vascular: No JVD.     Trachea: No tracheal deviation.  Cardiovascular:     Rate and Rhythm: Normal rate and regular rhythm.     Pulses:          Carotid pulses are 3+ on the right side and 3+ on the left side.      Radial pulses are 2+ on the right side and 2+ on the left side.       Dorsalis pedis pulses are 2+ on the right side and 2+ on the left side.       Posterior tibial pulses are 2+ on the right side and 2+ on the left side.     Heart sounds: Normal heart sounds, S1 normal and S2 normal. No murmur heard.    No friction rub. No gallop.  Pulmonary:     Effort: Pulmonary effort is normal. No accessory muscle usage or respiratory distress.     Breath sounds: Normal breath sounds and air entry. No stridor. No wheezing or rales.  Chest:     Chest wall: No tenderness.  Abdominal:  General: Bowel sounds are normal. There is no distension.     Palpations: Abdomen is soft. There is no shifting dullness, fluid wave, mass or pulsatile mass.      Tenderness: There is no abdominal tenderness. There is no guarding or rebound.  Musculoskeletal:        General: No tenderness or deformity. Normal range of motion.     Cervical back: Normal range of motion and neck supple.     Right lower leg: No edema.     Left lower leg: No edema.     Right foot: Normal range of motion. No deformity or bunion.     Left foot: Normal range of motion. No deformity or bunion.  Feet:     Right foot:     Protective Sensation: 6 sites tested.  6 sites sensed.     Skin integrity: No ulcer, blister, skin breakdown, erythema, warmth, callus, dry skin or fissure.     Toenail Condition: Right toenails are normal.     Left foot:     Protective Sensation: 6 sites tested.  6 sites sensed.     Skin integrity: No ulcer, blister, skin breakdown, erythema, warmth, callus, dry skin or fissure.     Toenail Condition: Left toenails are normal.  Lymphadenopathy:     Cervical: No cervical adenopathy.  Skin:    General: Skin is warm and dry.     Capillary Refill: Capillary refill takes less than 2 seconds.     Coloration: Skin is not pale.     Findings: No erythema or rash.  Neurological:     Mental Status: He is alert and oriented to person, place, and time.     Cranial Nerves: No cranial nerve deficit.     Motor: No abnormal muscle tone.     Coordination: Coordination normal.     Deep Tendon Reflexes: Reflexes are normal and symmetric.  Psychiatric:        Mood and Affect: Mood normal.        Behavior: Behavior normal. Behavior is cooperative.        Thought Content: Thought content normal.        Judgment: Judgment normal.        Assessment/Plan: 1. Encounter for general adult medical examination with abnormal findings Age-appropriate preventive screenings and vaccinations discussed, annual physical exam completed. Routine labs for health maintenance ordered, see below. PHM updated.  - POCT HgB A1C - Lipid Profile - CBC with Differential/Platelet -  CMP14+EGFR - TSH + free T4  2. Type 2 diabetes mellitus with hyperglycemia, without long-term current use of insulin (HCC) Stable, a1c 6.2, no change, continue medications as prescribed, routine labs ordered - POCT HgB A1C - Lipid Profile - CBC with Differential/Platelet - CMP14+EGFR - TSH + free T4  3. Acquired hypothyroidism Routine labs ordered - Lipid Profile - CBC with Differential/Platelet - CMP14+EGFR - TSH + free T4  4. Vitamin D deficiency Routine lab ordered - Vitamin D (25 hydroxy)  5. Dysuria Routine urinalysis - UA/M w/rflx Culture, Routine - Microscopic Examination  6. Medication refill - amLODipine (NORVASC) 5 MG tablet; Take 1 tablet (5 mg total) by mouth daily.  Dispense: 90 tablet; Refill: 3 - cetirizine (ZYRTEC) 10 MG tablet; TAKE 1 TABLET(10 MG) BY MOUTH DAILY  Dispense: 90 tablet; Refill: 3 - dapagliflozin propanediol (FARXIGA) 10 MG TABS tablet; Take 1 tablet (10 mg total) by mouth daily before breakfast.  Dispense: 90 tablet; Refill: 3 - febuxostat (ULORIC) 40 MG tablet;  TAKE 1 TABLET(40 MG) BY MOUTH DAILY  Dispense: 90 tablet; Refill: 3 - hydrochlorothiazide (HYDRODIURIL) 25 MG tablet; Take 1 tablet po QD  Dispense: 90 tablet; Refill: 3 - Lancets (ONETOUCH ULTRASOFT) lancets; Blood sugar testing daily - E11.65  Dispense: 100 each; Refill: 12 - levothyroxine (SYNTHROID) 50 MCG tablet; Take 1 tablet (50 mcg total) by mouth daily before breakfast.  Dispense: 90 tablet; Refill: 3 - losartan (COZAAR) 100 MG tablet; Take 1 tablet (100 mg total) by mouth daily.  Dispense: 90 tablet; Refill: 3 - montelukast (SINGULAIR) 10 MG tablet; Take one tab po qd for sinus congestion  Dispense: 90 tablet; Refill: 3 - rosuvastatin (CRESTOR) 5 MG tablet; Take 1 tablet (5 mg total) by mouth daily.  Dispense: 90 tablet; Refill: 3 - Semaglutide (RYBELSUS) 7 MG TABS; Take 1 tablet by mouth daily.  Dispense: 90 tablet; Refill: 3     General Counseling: Tabias verbalizes  understanding of the findings of todays visit and agrees with plan of treatment. I have discussed any further diagnostic evaluation that may be needed or ordered today. We also reviewed his medications today. he has been encouraged to call the office with any questions or concerns that should arise related to todays visit.    Orders Placed This Encounter  Procedures   UA/M w/rflx Culture, Routine   Lipid Profile   CBC with Differential/Platelet   CMP14+EGFR   TSH + free T4   Vitamin D (25 hydroxy)   POCT HgB A1C    Meds ordered this encounter  Medications   amLODipine (NORVASC) 5 MG tablet    Sig: Take 1 tablet (5 mg total) by mouth daily.    Dispense:  90 tablet    Refill:  3    For future refills   cetirizine (ZYRTEC) 10 MG tablet    Sig: TAKE 1 TABLET(10 MG) BY MOUTH DAILY    Dispense:  90 tablet    Refill:  3    For future refills   dapagliflozin propanediol (FARXIGA) 10 MG TABS tablet    Sig: Take 1 tablet (10 mg total) by mouth daily before breakfast.    Dispense:  90 tablet    Refill:  3    For future refills   febuxostat (ULORIC) 40 MG tablet    Sig: TAKE 1 TABLET(40 MG) BY MOUTH DAILY    Dispense:  90 tablet    Refill:  3    For future refills   hydrochlorothiazide (HYDRODIURIL) 25 MG tablet    Sig: Take 1 tablet po QD    Dispense:  90 tablet    Refill:  3    Please fill as 90 day prescription.   Lancets (ONETOUCH ULTRASOFT) lancets    Sig: Blood sugar testing daily - E11.65    Dispense:  100 each    Refill:  12    For future refills   levothyroxine (SYNTHROID) 50 MCG tablet    Sig: Take 1 tablet (50 mcg total) by mouth daily before breakfast.    Dispense:  90 tablet    Refill:  3    For future refills   losartan (COZAAR) 100 MG tablet    Sig: Take 1 tablet (100 mg total) by mouth daily.    Dispense:  90 tablet    Refill:  3    for future refills.   montelukast (SINGULAIR) 10 MG tablet    Sig: Take one tab po qd for sinus congestion    Dispense:  90  tablet    Refill:  3    For future refill   rosuvastatin (CRESTOR) 5 MG tablet    Sig: Take 1 tablet (5 mg total) by mouth daily.    Dispense:  90 tablet    Refill:  3    For future refills   Semaglutide (RYBELSUS) 7 MG TABS    Sig: Take 1 tablet by mouth daily.    Dispense:  90 tablet    Refill:  3    For future refills    Return in about 3 months (around 06/15/2022) for F/U, Recheck A1C, Kristoph Sattler PCP.   Total time spent:30 Minutes Time spent includes review of chart, medications, test results, and follow up plan with the patient.   [place] Controlled Substance Database was reviewed by me.  This patient was seen by Jonetta Osgood, FNP-C in collaboration with Dr. Clayborn Bigness as a part of collaborative care agreement.  Avyn Aden R. Valetta Fuller, MSN, FNP-C Internal medicine

## 2022-03-16 LAB — MICROSCOPIC EXAMINATION
Bacteria, UA: NONE SEEN
Casts: NONE SEEN /lpf
Epithelial Cells (non renal): NONE SEEN /hpf (ref 0–10)
RBC, Urine: NONE SEEN /hpf (ref 0–2)
WBC, UA: NONE SEEN /hpf (ref 0–5)

## 2022-03-16 LAB — UA/M W/RFLX CULTURE, ROUTINE
Bilirubin, UA: NEGATIVE
Ketones, UA: NEGATIVE
Leukocytes,UA: NEGATIVE
Nitrite, UA: NEGATIVE
Protein,UA: NEGATIVE
RBC, UA: NEGATIVE
Specific Gravity, UA: 1.019 (ref 1.005–1.030)
Urobilinogen, Ur: 0.2 mg/dL (ref 0.2–1.0)
pH, UA: 6 (ref 5.0–7.5)

## 2022-03-17 ENCOUNTER — Other Ambulatory Visit: Payer: Self-pay | Admitting: Nurse Practitioner

## 2022-03-17 DIAGNOSIS — M1A0720 Idiopathic chronic gout, left ankle and foot, without tophus (tophi): Secondary | ICD-10-CM

## 2022-05-09 DIAGNOSIS — R300 Dysuria: Secondary | ICD-10-CM | POA: Diagnosis not present

## 2022-05-09 DIAGNOSIS — E1165 Type 2 diabetes mellitus with hyperglycemia: Secondary | ICD-10-CM | POA: Diagnosis not present

## 2022-05-09 DIAGNOSIS — I10 Essential (primary) hypertension: Secondary | ICD-10-CM | POA: Diagnosis not present

## 2022-05-09 DIAGNOSIS — E559 Vitamin D deficiency, unspecified: Secondary | ICD-10-CM | POA: Diagnosis not present

## 2022-05-10 LAB — CMP14+EGFR
ALT: 44 IU/L (ref 0–44)
AST: 35 IU/L (ref 0–40)
Albumin/Globulin Ratio: 2.2 (ref 1.2–2.2)
Albumin: 4.7 g/dL (ref 4.1–5.1)
Alkaline Phosphatase: 69 IU/L (ref 44–121)
BUN/Creatinine Ratio: 9 (ref 9–20)
BUN: 11 mg/dL (ref 6–24)
Bilirubin Total: 0.4 mg/dL (ref 0.0–1.2)
CO2: 28 mmol/L (ref 20–29)
Calcium: 9.3 mg/dL (ref 8.7–10.2)
Chloride: 99 mmol/L (ref 96–106)
Creatinine, Ser: 1.22 mg/dL (ref 0.76–1.27)
Globulin, Total: 2.1 g/dL (ref 1.5–4.5)
Glucose: 104 mg/dL — ABNORMAL HIGH (ref 70–99)
Potassium: 3.6 mmol/L (ref 3.5–5.2)
Sodium: 140 mmol/L (ref 134–144)
Total Protein: 6.8 g/dL (ref 6.0–8.5)
eGFR: 72 mL/min/{1.73_m2} (ref >59)

## 2022-05-10 LAB — CBC WITH DIFFERENTIAL/PLATELET
Basophils Absolute: 0 10*3/uL (ref 0.0–0.2)
Basos: 1 %
EOS (ABSOLUTE): 0 10*3/uL (ref 0.0–0.4)
Eos: 1 %
Hematocrit: 48.3 % (ref 37.5–51.0)
Hemoglobin: 16.2 g/dL (ref 13.0–17.7)
Immature Grans (Abs): 0 10*3/uL (ref 0.0–0.1)
Immature Granulocytes: 1 %
Lymphocytes Absolute: 2.1 10*3/uL (ref 0.7–3.1)
Lymphs: 47 %
MCH: 26.8 pg (ref 26.6–33.0)
MCHC: 33.5 g/dL (ref 31.5–35.7)
MCV: 80 fL (ref 79–97)
Monocytes Absolute: 0.5 10*3/uL (ref 0.1–0.9)
Monocytes: 11 %
Neutrophils Absolute: 1.7 10*3/uL (ref 1.4–7.0)
Neutrophils: 39 %
Platelets: 235 10*3/uL (ref 150–450)
RBC: 6.04 x10E6/uL — ABNORMAL HIGH (ref 4.14–5.80)
RDW: 13.7 % (ref 11.6–15.4)
WBC: 4.4 10*3/uL (ref 3.4–10.8)

## 2022-05-10 LAB — LIPID PANEL
Chol/HDL Ratio: 6.5 ratio — ABNORMAL HIGH (ref 0.0–5.0)
Cholesterol, Total: 241 mg/dL — ABNORMAL HIGH (ref 100–199)
HDL: 37 mg/dL — ABNORMAL LOW (ref >39)
LDL Chol Calc (NIH): 185 mg/dL — ABNORMAL HIGH (ref 0–99)
Triglycerides: 106 mg/dL (ref 0–149)
VLDL Cholesterol Cal: 19 mg/dL (ref 5–40)

## 2022-05-10 LAB — TSH+FREE T4
Free T4: 1.29 ng/dL (ref 0.82–1.77)
TSH: 3.4 u[IU]/mL (ref 0.450–4.500)

## 2022-05-10 LAB — VITAMIN D 25 HYDROXY (VIT D DEFICIENCY, FRACTURES): Vit D, 25-Hydroxy: 13.8 ng/mL — ABNORMAL LOW (ref 30.0–100.0)

## 2022-05-20 ENCOUNTER — Encounter: Payer: Self-pay | Admitting: Nurse Practitioner

## 2022-06-06 ENCOUNTER — Telehealth: Payer: BC Managed Care – PPO | Admitting: Nurse Practitioner

## 2022-06-06 ENCOUNTER — Encounter: Payer: Self-pay | Admitting: Nurse Practitioner

## 2022-06-06 VITALS — Resp 16 | Ht 71.0 in

## 2022-06-06 DIAGNOSIS — J22 Unspecified acute lower respiratory infection: Secondary | ICD-10-CM

## 2022-06-06 DIAGNOSIS — R051 Acute cough: Secondary | ICD-10-CM

## 2022-06-06 MED ORDER — HYDROCOD POLI-CHLORPHE POLI ER 10-8 MG/5ML PO SUER: 5.0000 mL | Freq: Two times a day (BID) | ORAL | 0 refills | Status: DC | PRN

## 2022-06-06 MED ORDER — AMOXICILLIN-POT CLAVULANATE 875-125 MG PO TABS: 1.0000 | ORAL_TABLET | Freq: Two times a day (BID) | ORAL | 0 refills | Status: AC

## 2022-06-06 NOTE — Progress Notes (Signed)
[facility] [place], [place] [address]  Internal MEDICINE  Telephone Visit  Patient Name: Ronald Mata  [id number]  [id number]  Date of Service: 06/06/2022  I connected with the patient at 0900 by telephone and verified the patients identity using two identifiers.   I discussed the limitations, risks, security and privacy concerns of performing an evaluation and management service by telephone and the availability of in person appointments. I also discussed with the patient that there may be a patient responsible charge related to the service.  The patient expressed understanding and agrees to proceed.    Chief Complaint  Patient presents with   Telephone Assessment    [phone], can do either video or telephone call   Telephone Screen    Inconclusive Covid test   Shortness of Breath   Cough    HPI Ronald Mata presents for a telehealth virtual visit for SOB and cough.  Covid test was inconclusive.  Onset of symptoms was 2 weeks. Additional symptoms include sneezing, stuffy head, runny nose, chest tightness. Dizziness Daughter is being treated for recent diagnosis of pneumonia.    Current Medication: Outpatient Encounter Medications as of 06/06/2022  Medication Sig   albuterol (VENTOLIN HFA) 108 (90 Base) MCG/ACT inhaler Inhale 2 puffs into the lungs every 6 (six) hours as needed for wheezing or shortness of breath.   amLODipine (NORVASC) 5 MG tablet Take 1 tablet (5 mg total) by mouth daily.   amoxicillin-clavulanate (AUGMENTIN) 875-125 MG tablet Take 1 tablet by mouth 2 (two) times daily for 14 days.   cetirizine (ZYRTEC) 10 MG tablet TAKE 1 TABLET(10 MG) BY MOUTH DAILY   chlorpheniramine-HYDROcodone (TUSSIONEX) 10-8 MG/5ML Take 5 mLs by mouth every 12 (twelve) hours as needed for cough.   co-enzyme Q-10 30 MG capsule Take 30 mg by mouth daily.   dapagliflozin propanediol (FARXIGA) 10 MG TABS tablet Take 1 tablet (10 mg total) by mouth daily before  breakfast.   ELDERBERRY PO Take by mouth daily.   febuxostat (ULORIC) 40 MG tablet TAKE 1 TABLET(40 MG) BY MOUTH DAILY   fluticasone (FLONASE) 50 MCG/ACT nasal spray SHAKE LIQUID AND USE 2 SPRAYS IN EACH NOSTRIL DAILY   hydrochlorothiazide (HYDRODIURIL) 25 MG tablet Take 1 tablet po QD   ibuprofen (ADVIL) 800 MG tablet Take 1 tablet (800 mg total) by mouth every 8 (eight) hours as needed.   Lancets (ONETOUCH ULTRASOFT) lancets Blood sugar testing daily - E11.65   levothyroxine (SYNTHROID) 50 MCG tablet Take 1 tablet (50 mcg total) by mouth daily before breakfast.   losartan (COZAAR) 100 MG tablet Take 1 tablet (100 mg total) by mouth daily.   methocarbamol (ROBAXIN) 500 MG tablet Take 1 tablet (500 mg total) by mouth every 8 (eight) hours as needed for muscle spasms.   montelukast (SINGULAIR) 10 MG tablet Take one tab po qd for sinus congestion   rosuvastatin (CRESTOR) 5 MG tablet Take 1 tablet (5 mg total) by mouth daily.   Semaglutide (RYBELSUS) 7 MG TABS Take 1 tablet by mouth daily.   sildenafil (VIAGRA) 100 MG tablet Take 0.5-1 tablets (50-100 mg total) by mouth daily as needed for erectile dysfunction.   No facility-administered encounter medications on file as of 06/06/2022.    Surgical History: Past Surgical History:  Procedure Laterality Date   CARDIAC CATHETERIZATION     15 years ago no stents   COLONOSCOPY WITH PROPOFOL N/A 10/15/2020   Procedure: COLONOSCOPY WITH PROPOFOL;  Surgeon: [name], MD;  Location:  [facility];  Service: Endoscopy;  Laterality: N/A;  Diabetic   POLYPECTOMY  10/15/2020   Procedure: POLYPECTOMY;  Surgeon: [name], MD;  Location: [facility];  Service: Endoscopy;;   stomach wrap and achalasia      Medical History: Past Medical History:  Diagnosis Date   Arthritis    feet   Diabetes mellitus without complication ([place])    Heart murmur    Hypertension    Thyroid disease     Family History: Family History  Problem  Relation Age of Onset   Cancer Mother    Diabetes Mother    Hypertension Mother    Hyperlipidemia Mother    Stroke Mother    Osteoarthritis Mother    Diabetes Father    Osteoarthritis Father    Glaucoma Father     Social History   Socioeconomic History   Marital status: Married    Spouse name: Not on file   Number of children: Not on file   Years of education: Not on file   Highest education level: Not on file  Occupational History   Not on file  Tobacco Use   Smoking status: Never   Smokeless tobacco: Never  Vaping Use   Vaping Use: Never used  Substance and Sexual Activity   Alcohol use: No    Alcohol/week: 0.0 standard drinks of alcohol   Drug use: No   Sexual activity: Not on file  Other Topics Concern   Not on file  Social History Narrative   Not on file   Social Determinants of Health   Financial Resource Strain: Not on file  Food Insecurity: Not on file  Transportation Needs: Not on file  Physical Activity: Not on file  Stress: Not on file  Social Connections: Not on file  Intimate Partner Violence: Not on file      Review of Systems  Constitutional:  Positive for fatigue. Negative for chills and fever.  HENT:  Positive for congestion, postnasal drip, rhinorrhea, sinus pressure and sinus pain. Negative for sore throat.   Respiratory:  Positive for cough, chest tightness and shortness of breath. Negative for wheezing.   Cardiovascular: Negative.  Negative for chest pain and palpitations.  Gastrointestinal: Negative.  Negative for diarrhea, nausea and vomiting.  Genitourinary: Negative.   Musculoskeletal: Negative.  Negative for myalgias.  Neurological:  Positive for dizziness and headaches.    Vital Signs: Resp 16   Ht '5\' 11"'$  (1.803 m)   BMI 35.17 kg/m    Observation/Objective: He is alert and oriented and engages in conversation appropriately. He does not appear to be in any acute distress over video call.     Assessment/Plan: 1. Lower  respiratory infection (e.g., bronchitis, pneumonia, pneumonitis, pulmonitis) Empiric antibiotic treatment prescribed - amoxicillin-clavulanate (AUGMENTIN) 875-125 MG tablet; Take 1 tablet by mouth 2 (two) times daily for 14 days.  Dispense: 28 tablet; Refill: 0  2. Acute cough Medication prescribed for symptomatic relief of cough - chlorpheniramine-HYDROcodone (TUSSIONEX) 10-8 MG/5ML; Take 5 mLs by mouth every 12 (twelve) hours as needed for cough.  Dispense: 140 mL; Refill: 0   General Counseling: Kermitt verbalizes understanding of the findings of today's phone visit and agrees with plan of treatment. I have discussed any further diagnostic evaluation that may be needed or ordered today. We also reviewed his medications today. he has been encouraged to call the office with any questions or concerns that should arise related to todays visit.  Return if symptoms worsen or fail to  improve.   No orders of the defined types were placed in this encounter.   Meds ordered this encounter  Medications   amoxicillin-clavulanate (AUGMENTIN) 875-125 MG tablet    Sig: Take 1 tablet by mouth 2 (two) times daily for 14 days.    Dispense:  28 tablet    Refill:  0    Fill today   chlorpheniramine-HYDROcodone (TUSSIONEX) 10-8 MG/5ML    Sig: Take 5 mLs by mouth every 12 (twelve) hours as needed for cough.    Dispense:  140 mL    Refill:  0    Fill today    Time spent:10 Minutes Time spent with patient included reviewing progress notes, labs, imaging studies, and discussing plan for follow up.  [place] Controlled Substance Database was reviewed by me for overdose risk score (ORS) if appropriate.  This patient was seen by Jonetta Osgood, FNP-C in collaboration with Dr. Clayborn Bigness as a part of collaborative care agreement.  My Madariaga R. Valetta Fuller, MSN, FNP-C Internal medicine

## 2022-06-07 DIAGNOSIS — H35033 Hypertensive retinopathy, bilateral: Secondary | ICD-10-CM | POA: Diagnosis not present

## 2022-06-14 ENCOUNTER — Ambulatory Visit: Payer: BC Managed Care – PPO | Admitting: Nurse Practitioner

## 2022-06-18 ENCOUNTER — Other Ambulatory Visit: Payer: Self-pay | Admitting: Nurse Practitioner

## 2022-06-18 DIAGNOSIS — Z760 Encounter for issue of repeat prescription: Secondary | ICD-10-CM

## 2022-06-26 ENCOUNTER — Ambulatory Visit: Payer: BC Managed Care – PPO | Admitting: Nurse Practitioner

## 2022-06-27 ENCOUNTER — Ambulatory Visit: Payer: BC Managed Care – PPO | Admitting: Nurse Practitioner

## 2022-07-20 DIAGNOSIS — J111 Influenza due to unidentified influenza virus with other respiratory manifestations: Secondary | ICD-10-CM | POA: Diagnosis not present

## 2022-07-21 ENCOUNTER — Telehealth: Payer: Self-pay

## 2022-07-21 ENCOUNTER — Other Ambulatory Visit: Payer: Self-pay

## 2022-07-21 MED ORDER — AZITHROMYCIN 250 MG PO TABS: ORAL_TABLET | ORAL | 0 refills | Status: DC

## 2022-07-21 MED ORDER — PREDNISONE 10 MG PO TABS: ORAL_TABLET | ORAL | 0 refills | Status: DC

## 2022-07-21 NOTE — Telephone Encounter (Signed)
Pt called that he saw online MD with his insurance and they diagnosis him FLU and gave him OSELTAMIVIR and he still having sob and coughing and inflammation and his spo2 97% as per dr Humphrey Rolls add zpak and prednisone  and also send order for flu,rsv and covid to [facility]

## 2022-07-24 DIAGNOSIS — U071 COVID-19: Secondary | ICD-10-CM | POA: Diagnosis not present

## 2022-07-25 ENCOUNTER — Encounter: Payer: Self-pay | Admitting: Nurse Practitioner

## 2022-07-25 ENCOUNTER — Telehealth: Payer: BC Managed Care – PPO | Admitting: Nurse Practitioner

## 2022-07-25 VITALS — BP 128/94 | HR 70 | Temp 97.3°F | Resp 16 | Ht 71.0 in | Wt 243.0 lb

## 2022-07-25 DIAGNOSIS — J069 Acute upper respiratory infection, unspecified: Secondary | ICD-10-CM

## 2022-07-25 DIAGNOSIS — U071 COVID-19: Secondary | ICD-10-CM | POA: Diagnosis not present

## 2022-07-25 MED ORDER — MOLNUPIRAVIR EUA 200MG CAPSULE: 4.0000 | ORAL_CAPSULE | Freq: Two times a day (BID) | ORAL | 0 refills | Status: AC

## 2022-07-25 NOTE — Progress Notes (Signed)
[facility] [place], [place] [address]  Internal MEDICINE  Telephone Visit  Patient Name: Ronald Mata  [id number]  [id number]  Date of Service: 07/25/2022  I connected with the patient at 1335 by telephone and verified the patients identity using two identifiers.   I discussed the limitations, risks, security and privacy concerns of performing an evaluation and management service by telephone and the availability of in person appointments. I also discussed with the patient that there may be a patient responsible charge related to the service.  The patient expressed understanding and agrees to proceed.    Chief Complaint  Patient presents with   Telephone Screen    Covid positive on yesterday.  Weakness.    Telephone Assessment   Cough   Headache    HPI Ronald Mata presents for a telehealth virtual visit for covid positive URI Covid positive yesterday, had exposure at church Reports weakness, cough, headache  Symptoms started on 07/19/22 Took tamiflu and zpack    Current Medication: Outpatient Encounter Medications as of 07/25/2022  Medication Sig   albuterol (VENTOLIN HFA) 108 (90 Base) MCG/ACT inhaler Inhale 2 puffs into the lungs every 6 (six) hours as needed for wheezing or shortness of breath.   amLODipine (NORVASC) 5 MG tablet Take 1 tablet (5 mg total) by mouth daily.   azithromycin (ZITHROMAX) 250 MG tablet Use as directed  for 5 day   cetirizine (ZYRTEC) 10 MG tablet TAKE 1 TABLET(10 MG) BY MOUTH DAILY   chlorpheniramine-HYDROcodone (TUSSIONEX) 10-8 MG/5ML Take 5 mLs by mouth every 12 (twelve) hours as needed for cough.   co-enzyme Q-10 30 MG capsule Take 30 mg by mouth daily.   dapagliflozin propanediol (FARXIGA) 10 MG TABS tablet TAKE 1 TABLET(10 MG) BY MOUTH DAILY BEFORE BREAKFAST   ELDERBERRY PO Take by mouth daily.   febuxostat (ULORIC) 40 MG tablet TAKE 1 TABLET(40 MG) BY MOUTH DAILY   fluticasone (FLONASE) 50 MCG/ACT nasal spray SHAKE LIQUID  AND USE 2 SPRAYS IN EACH NOSTRIL DAILY   hydrochlorothiazide (HYDRODIURIL) 25 MG tablet Take 1 tablet po QD   ibuprofen (ADVIL) 800 MG tablet Take 1 tablet (800 mg total) by mouth every 8 (eight) hours as needed.   Lancets (ONETOUCH ULTRASOFT) lancets Blood sugar testing daily - E11.65   levothyroxine (SYNTHROID) 50 MCG tablet Take 1 tablet (50 mcg total) by mouth daily before breakfast.   losartan (COZAAR) 100 MG tablet Take 1 tablet (100 mg total) by mouth daily.   methocarbamol (ROBAXIN) 500 MG tablet Take 1 tablet (500 mg total) by mouth every 8 (eight) hours as needed for muscle spasms.   molnupiravir EUA (LAGEVRIO) 200 mg CAPS capsule Take 4 capsules (800 mg total) by mouth 2 (two) times daily for 5 days.   montelukast (SINGULAIR) 10 MG tablet Take one tab po qd for sinus congestion   predniSONE (DELTASONE) 10 MG tablet Take 1 tab po 3 x day for 3 days then take 1 tab po 2 x a day for 3 days and then take 1 tab po daily for 3 days   rosuvastatin (CRESTOR) 5 MG tablet Take 1 tablet (5 mg total) by mouth daily.   Semaglutide (RYBELSUS) 7 MG TABS Take 1 tablet by mouth daily.   sildenafil (VIAGRA) 100 MG tablet Take 0.5-1 tablets (50-100 mg total) by mouth daily as needed for erectile dysfunction.   No facility-administered encounter medications on file as of 07/25/2022.    Surgical History: Past Surgical History:  Procedure  Laterality Date   CARDIAC CATHETERIZATION     15 years ago no stents   COLONOSCOPY WITH PROPOFOL N/A 10/15/2020   Procedure: COLONOSCOPY WITH PROPOFOL;  Surgeon: [name], MD;  Location: [facility];  Service: Endoscopy;  Laterality: N/A;  Diabetic   POLYPECTOMY  10/15/2020   Procedure: POLYPECTOMY;  Surgeon: [name], MD;  Location: [facility];  Service: Endoscopy;;   stomach wrap and achalasia      Medical History: Past Medical History:  Diagnosis Date   Arthritis    feet   Diabetes mellitus without complication ([place])    Heart murmur     Hypertension    Thyroid disease     Family History: Family History  Problem Relation Age of Onset   Cancer Mother    Diabetes Mother    Hypertension Mother    Hyperlipidemia Mother    Stroke Mother    Osteoarthritis Mother    Diabetes Father    Osteoarthritis Father    Glaucoma Father     Social History   Socioeconomic History   Marital status: Married    Spouse name: Not on file   Number of children: Not on file   Years of education: Not on file   Highest education level: Not on file  Occupational History   Not on file  Tobacco Use   Smoking status: Never   Smokeless tobacco: Never  Vaping Use   Vaping Use: Never used  Substance and Sexual Activity   Alcohol use: No    Alcohol/week: 0.0 standard drinks of alcohol   Drug use: No   Sexual activity: Not on file  Other Topics Concern   Not on file  Social History Narrative   Not on file   Social Determinants of Health   Financial Resource Strain: Not on file  Food Insecurity: Not on file  Transportation Needs: Not on file  Physical Activity: Not on file  Stress: Not on file  Social Connections: Not on file  Intimate Partner Violence: Not on file      Review of Systems  Constitutional:  Positive for fatigue.  HENT:  Positive for congestion, postnasal drip, rhinorrhea and sore throat.   Respiratory:  Positive for cough, chest tightness and shortness of breath. Negative for wheezing.   Cardiovascular: Negative.  Negative for chest pain and palpitations.  Gastrointestinal: Negative.   Musculoskeletal: Negative.   Neurological:  Positive for dizziness, light-headedness and headaches.    Vital Signs: BP (!) 128/94   Pulse 70   Temp (!) 97.3 F (36.3 C)   Resp 16   Ht '5\' 11"'$  (1.803 m)   Wt 243 lb (110.2 kg)   BMI 33.89 kg/m    Observation/Objective: He is alert and oriented and engages in conversation appropriately. He does not sound as though he is in any acute distress over telephone call      Assessment/Plan: 1. Upper respiratory tract infection due to COVID-19 virus Antiviral prescribed.  - molnupiravir EUA (LAGEVRIO) 200 mg CAPS capsule; Take 4 capsules (800 mg total) by mouth 2 (two) times daily for 5 days.  Dispense: 40 capsule; Refill: 0   General Counseling: Ronald Mata verbalizes understanding of the findings of today's phone visit and agrees with plan of treatment. I have discussed any further diagnostic evaluation that may be needed or ordered today. We also reviewed his medications today. he has been encouraged to call the office with any questions or concerns that should arise related to todays visit.  Return if symptoms worsen or fail to improve.   No orders of the defined types were placed in this encounter.   Meds ordered this encounter  Medications   molnupiravir EUA (LAGEVRIO) 200 mg CAPS capsule    Sig: Take 4 capsules (800 mg total) by mouth 2 (two) times daily for 5 days.    Dispense:  40 capsule    Refill:  0    Time spent:10 Minutes Time spent with patient included reviewing progress notes, labs, imaging studies, and discussing plan for follow up.  [place] Controlled Substance Database was reviewed by me for overdose risk score (ORS) if appropriate.  This patient was seen by Jonetta Osgood, FNP-C in collaboration with Dr. Clayborn Bigness as a part of collaborative care agreement.  Shauntae Reitman R. Valetta Fuller, MSN, FNP-C Internal medicine

## 2022-09-10 ENCOUNTER — Other Ambulatory Visit: Payer: Self-pay | Admitting: Nurse Practitioner

## 2022-09-10 DIAGNOSIS — Z760 Encounter for issue of repeat prescription: Secondary | ICD-10-CM

## 2022-09-16 ENCOUNTER — Other Ambulatory Visit: Payer: Self-pay | Admitting: Nurse Practitioner

## 2022-09-16 DIAGNOSIS — Z760 Encounter for issue of repeat prescription: Secondary | ICD-10-CM

## 2022-12-14 ENCOUNTER — Ambulatory Visit (INDEPENDENT_AMBULATORY_CARE_PROVIDER_SITE_OTHER): Payer: BC Managed Care – PPO | Admitting: Nurse Practitioner

## 2022-12-14 ENCOUNTER — Encounter: Payer: Self-pay | Admitting: Nurse Practitioner

## 2022-12-14 VITALS — BP 120/88 | HR 63 | Temp 98.1°F | Resp 16 | Ht 71.0 in | Wt 250.2 lb

## 2022-12-14 DIAGNOSIS — I10 Essential (primary) hypertension: Secondary | ICD-10-CM

## 2022-12-14 DIAGNOSIS — E782 Mixed hyperlipidemia: Secondary | ICD-10-CM | POA: Diagnosis not present

## 2022-12-14 DIAGNOSIS — E1165 Type 2 diabetes mellitus with hyperglycemia: Secondary | ICD-10-CM

## 2022-12-14 DIAGNOSIS — H6121 Impacted cerumen, right ear: Secondary | ICD-10-CM | POA: Diagnosis not present

## 2022-12-14 DIAGNOSIS — E039 Hypothyroidism, unspecified: Secondary | ICD-10-CM

## 2022-12-14 DIAGNOSIS — E559 Vitamin D deficiency, unspecified: Secondary | ICD-10-CM

## 2022-12-14 DIAGNOSIS — Z79899 Other long term (current) drug therapy: Secondary | ICD-10-CM

## 2022-12-14 LAB — POCT GLYCOSYLATED HEMOGLOBIN (HGB A1C): Hemoglobin A1C: 6.2 % — AB (ref 4.0–5.6)

## 2022-12-14 MED ORDER — FEBUXOSTAT 40 MG PO TABS: ORAL_TABLET | ORAL | 3 refills | Status: DC

## 2022-12-14 MED ORDER — AMLODIPINE BESYLATE 5 MG PO TABS: 5.0000 mg | ORAL_TABLET | Freq: Every day | ORAL | 3 refills | Status: DC

## 2022-12-14 MED ORDER — CETIRIZINE HCL 10 MG PO TABS: ORAL_TABLET | ORAL | 3 refills | Status: DC

## 2022-12-14 MED ORDER — IBUPROFEN 800 MG PO TABS: 800.0000 mg | ORAL_TABLET | Freq: Three times a day (TID) | ORAL | 0 refills | Status: AC | PRN

## 2022-12-14 MED ORDER — MONTELUKAST SODIUM 10 MG PO TABS: ORAL_TABLET | ORAL | 3 refills | Status: DC

## 2022-12-14 MED ORDER — LOSARTAN POTASSIUM 100 MG PO TABS: 100.0000 mg | ORAL_TABLET | Freq: Every day | ORAL | 3 refills | Status: DC

## 2022-12-14 MED ORDER — DAPAGLIFLOZIN PROPANEDIOL 10 MG PO TABS: ORAL_TABLET | ORAL | 0 refills | Status: DC

## 2022-12-14 MED ORDER — RYBELSUS 7 MG PO TABS: 1.0000 | ORAL_TABLET | Freq: Every day | ORAL | 3 refills | Status: DC

## 2022-12-14 MED ORDER — FLUTICASONE PROPIONATE 50 MCG/ACT NA SUSP: NASAL | 3 refills | Status: DC

## 2022-12-14 MED ORDER — ROSUVASTATIN CALCIUM 5 MG PO TABS: 5.0000 mg | ORAL_TABLET | Freq: Every day | ORAL | 3 refills | Status: DC

## 2022-12-14 NOTE — Progress Notes (Signed)
[facility] [address] [place], [place] [address]  Internal MEDICINE  Office Visit Note  Patient Name: Ronald Mata  [id number]  [id number]  Date of Service: 12/14/2022  Chief Complaint  Patient presents with   Diabetes   Hypertension   Follow-up    HPI Ronald Mata presents for a follow-up visit for diabetes, hypertension.  Diabetes -- A1c 6.2, no change, stable. Takes ozempic and farxiga.  Hypertension --well controlled with current medications  Fullness in right ear, dizziness, allergies     Current Medication: Outpatient Encounter Medications as of 12/14/2022  Medication Sig   albuterol (VENTOLIN HFA) 108 (90 Base) MCG/ACT inhaler Inhale 2 puffs into the lungs every 6 (six) hours as needed for wheezing or shortness of breath.   co-enzyme Q-10 30 MG capsule Take 30 mg by mouth daily.   ELDERBERRY PO Take by mouth daily.   hydrochlorothiazide (HYDRODIURIL) 25 MG tablet TAKE 1 TABLET BY MOUTH EVERY DAY   Lancets (ONETOUCH ULTRASOFT) lancets Blood sugar testing daily - E11.65   levothyroxine (SYNTHROID) 50 MCG tablet TAKE 1 TABLET(50 MCG) BY MOUTH DAILY BEFORE BREAKFAST   methocarbamol (ROBAXIN) 500 MG tablet Take 1 tablet (500 mg total) by mouth every 8 (eight) hours as needed for muscle spasms.   sildenafil (VIAGRA) 100 MG tablet Take 0.5-1 tablets (50-100 mg total) by mouth daily as needed for erectile dysfunction.   [DISCONTINUED] amLODipine (NORVASC) 5 MG tablet Take 1 tablet (5 mg total) by mouth daily.   [DISCONTINUED] azithromycin (ZITHROMAX) 250 MG tablet Use as directed  for 5 day   [DISCONTINUED] cetirizine (ZYRTEC) 10 MG tablet TAKE 1 TABLET(10 MG) BY MOUTH DAILY   [DISCONTINUED] chlorpheniramine-HYDROcodone (TUSSIONEX) 10-8 MG/5ML Take 5 mLs by mouth every 12 (twelve) hours as needed for cough.   [DISCONTINUED] dapagliflozin propanediol (FARXIGA) 10 MG TABS tablet TAKE 1 TABLET(10 MG) BY MOUTH DAILY BEFORE BREAKFAST   [DISCONTINUED] febuxostat (ULORIC) 40 MG  tablet TAKE 1 TABLET(40 MG) BY MOUTH DAILY   [DISCONTINUED] fluticasone (FLONASE) 50 MCG/ACT nasal spray SHAKE LIQUID AND USE 2 SPRAYS IN EACH NOSTRIL DAILY   [DISCONTINUED] ibuprofen (ADVIL) 800 MG tablet Take 1 tablet (800 mg total) by mouth every 8 (eight) hours as needed.   [DISCONTINUED] losartan (COZAAR) 100 MG tablet Take 1 tablet (100 mg total) by mouth daily.   [DISCONTINUED] montelukast (SINGULAIR) 10 MG tablet Take one tab po qd for sinus congestion   [DISCONTINUED] predniSONE (DELTASONE) 10 MG tablet Take 1 tab po 3 x day for 3 days then take 1 tab po 2 x a day for 3 days and then take 1 tab po daily for 3 days   [DISCONTINUED] rosuvastatin (CRESTOR) 5 MG tablet Take 1 tablet (5 mg total) by mouth daily.   [DISCONTINUED] Semaglutide (RYBELSUS) 7 MG TABS Take 1 tablet by mouth daily.   amLODipine (NORVASC) 5 MG tablet Take 1 tablet (5 mg total) by mouth daily.   cetirizine (ZYRTEC) 10 MG tablet TAKE 1 TABLET(10 MG) BY MOUTH DAILY   dapagliflozin propanediol (FARXIGA) 10 MG TABS tablet TAKE 1 TABLET(10 MG) BY MOUTH DAILY BEFORE BREAKFAST   febuxostat (ULORIC) 40 MG tablet TAKE 1 TABLET(40 MG) BY MOUTH DAILY   fluticasone (FLONASE) 50 MCG/ACT nasal spray SHAKE LIQUID AND USE 2 SPRAYS IN EACH NOSTRIL DAILY   ibuprofen (ADVIL) 800 MG tablet Take 1 tablet (800 mg total) by mouth every 8 (eight) hours as needed.   losartan (COZAAR) 100 MG tablet Take 1 tablet (100 mg total) by  mouth daily.   montelukast (SINGULAIR) 10 MG tablet Take one tab po qd for sinus congestion   rosuvastatin (CRESTOR) 5 MG tablet Take 1 tablet (5 mg total) by mouth daily.   Semaglutide (RYBELSUS) 7 MG TABS Take 1 tablet (7 mg total) by mouth daily.   No facility-administered encounter medications on file as of 12/14/2022.    Surgical History: Past Surgical History:  Procedure Laterality Date   CARDIAC CATHETERIZATION     15 years ago no stents   COLONOSCOPY WITH PROPOFOL N/A 10/15/2020   Procedure: COLONOSCOPY  WITH PROPOFOL;  Surgeon: Midge Minium, MD;  [facility];  Service: Endoscopy;  Laterality: N/A;  Diabetic   POLYPECTOMY  10/15/2020   Procedure: POLYPECTOMY;  Surgeon: Midge Minium, MD;  Location: [facility];  Service: Endoscopy;;   stomach wrap and achalasia      Medical History: Past Medical History:  Diagnosis Date   Arthritis    feet   Diabetes mellitus without complication (HCC)    Heart murmur    Hypertension    Thyroid disease     Family History: Family History  Problem Relation Age of Onset   Cancer Mother    Diabetes Mother    Hypertension Mother    Hyperlipidemia Mother    Stroke Mother    Osteoarthritis Mother    Diabetes Father    Osteoarthritis Father    Glaucoma Father     Social History   Socioeconomic History   Marital status: Married    Spouse name: Not on file   Number of children: Not on file   Years of education: Not on file   Highest education level: Not on file  Occupational History   Not on file  Tobacco Use   Smoking status: Never   Smokeless tobacco: Never  Vaping Use   Vaping Use: Never used  Substance and Sexual Activity   Alcohol use: No    Alcohol/week: 0.0 standard drinks of alcohol   Drug use: No   Sexual activity: Not on file  Other Topics Concern   Not on file  Social History Narrative   Not on file   Social Determinants of Health   Financial Resource Strain: Not on file  Food Insecurity: Not on file  Transportation Needs: Not on file  Physical Activity: Not on file  Stress: Not on file  Social Connections: Not on file  Intimate Partner Violence: Not on file      Review of Systems  Constitutional:  Negative for chills, fatigue and unexpected weight change.  HENT:  Negative for congestion, rhinorrhea, sneezing and sore throat.   Eyes:  Negative for redness.  Respiratory: Negative.  Negative for cough, chest tightness and shortness of breath.   Cardiovascular: Negative.  Negative for  chest pain and palpitations.  Gastrointestinal:  Negative for abdominal pain, constipation, diarrhea, nausea and vomiting.  Genitourinary:  Negative for dysuria and frequency.  Musculoskeletal:  Negative for arthralgias, back pain, joint swelling and neck pain.  Skin:  Negative for rash.  Neurological: Negative.  Negative for tremors and numbness.  Hematological:  Negative for adenopathy. Does not bruise/bleed easily.  Psychiatric/Behavioral:  Negative for behavioral problems (Depression), sleep disturbance and suicidal ideas. The patient is not nervous/anxious.     Vital Signs: BP 120/88   Pulse 63   Temp 98.1 F (36.7 C)   Resp 16   Ht 5\' 11"  (1.803 m)   Wt 250 lb 3.2 oz (113.5 kg)   SpO2  97%   BMI 34.90 kg/m    Physical Exam Vitals reviewed.  Constitutional:      General: He is not in acute distress.    Appearance: Normal appearance. He is obese. He is not ill-appearing.  HENT:     Head: Normocephalic and atraumatic.  Eyes:     Pupils: Pupils are equal, round, and reactive to light.  Cardiovascular:     Rate and Rhythm: Normal rate and regular rhythm.  Pulmonary:     Effort: Pulmonary effort is normal. No respiratory distress.  Neurological:     Mental Status: He is alert and oriented to person, place, and time.  Psychiatric:        Mood and Affect: Mood normal.        Behavior: Behavior normal.        Assessment/Plan: 1. Type 2 diabetes mellitus with hyperglycemia, without long-term current use of insulin (HCC) Stable, A1c is stable, continue farxiga and rybelsus as prescribed. Routine labs ordered  - POCT glycosylated hemoglobin (Hb A1C) - CBC with Differential/Platelet - CMP14+EGFR - TSH + free T4 - Lipid Profile - dapagliflozin propanediol (FARXIGA) 10 MG TABS tablet; TAKE 1 TABLET(10 MG) BY MOUTH DAILY BEFORE BREAKFAST  Dispense: 90 tablet; Refill: 0 - Semaglutide (RYBELSUS) 7 MG TABS; Take 1 tablet (7 mg total) by mouth daily.  Dispense: 90 tablet;  Refill: 3  2. Essential hypertension Continue amlodipine and losartan as prescribed.  - amLODipine (NORVASC) 5 MG tablet; Take 1 tablet (5 mg total) by mouth daily.  Dispense: 90 tablet; Refill: 3 - losartan (COZAAR) 100 MG tablet; Take 1 tablet (100 mg total) by mouth daily.  Dispense: 90 tablet; Refill: 3  3. Acquired hypothyroidism Routine labs ordered  - TSH + free T4 - Lipid Profile  4. Mixed hyperlipidemia Routine labs ordered  - TSH + free T4 - Lipid Profile  5. Vitamin D deficiency Routine labs ordered  - Vitamin D (25 hydroxy)  6. Impacted cerumen of right ear Right ear lavage done in office, ear canal is cleared - Ear Lavage  7. Encounter for medication review Medication list reviewed, updated and refills ordered - cetirizine (ZYRTEC) 10 MG tablet; TAKE 1 TABLET(10 MG) BY MOUTH DAILY  Dispense: 90 tablet; Refill: 3 - febuxostat (ULORIC) 40 MG tablet; TAKE 1 TABLET(40 MG) BY MOUTH DAILY  Dispense: 90 tablet; Refill: 3 - fluticasone (FLONASE) 50 MCG/ACT nasal spray; SHAKE LIQUID AND USE 2 SPRAYS IN EACH NOSTRIL DAILY  Dispense: 48 g; Refill: 3 - ibuprofen (ADVIL) 800 MG tablet; Take 1 tablet (800 mg total) by mouth every 8 (eight) hours as needed.  Dispense: 90 tablet; Refill: 0 - montelukast (SINGULAIR) 10 MG tablet; Take one tab po qd for sinus congestion  Dispense: 90 tablet; Refill: 3 - rosuvastatin (CRESTOR) 5 MG tablet; Take 1 tablet (5 mg total) by mouth daily.  Dispense: 90 tablet; Refill: 3   General Counseling: Ronald Mata verbalizes understanding of the findings of todays visit and agrees with plan of treatment. I have discussed any further diagnostic evaluation that may be needed or ordered today. We also reviewed his medications today. he has been encouraged to call the office with any questions or concerns that should arise related to todays visit.    Orders Placed This Encounter  Procedures   CBC with Differential/Platelet   CMP14+EGFR   TSH + free T4    Lipid Profile   Vitamin D (25 hydroxy)   POCT glycosylated hemoglobin (Hb A1C)   Ear Lavage  Meds ordered this encounter  Medications   amLODipine (NORVASC) 5 MG tablet    Sig: Take 1 tablet (5 mg total) by mouth daily.    Dispense:  90 tablet    Refill:  3    For future refills   cetirizine (ZYRTEC) 10 MG tablet    Sig: TAKE 1 TABLET(10 MG) BY MOUTH DAILY    Dispense:  90 tablet    Refill:  3    For future refills   dapagliflozin propanediol (FARXIGA) 10 MG TABS tablet    Sig: TAKE 1 TABLET(10 MG) BY MOUTH DAILY BEFORE BREAKFAST    Dispense:  90 tablet    Refill:  0   febuxostat (ULORIC) 40 MG tablet    Sig: TAKE 1 TABLET(40 MG) BY MOUTH DAILY    Dispense:  90 tablet    Refill:  3    For future refills   fluticasone (FLONASE) 50 MCG/ACT nasal spray    Sig: SHAKE LIQUID AND USE 2 SPRAYS IN EACH NOSTRIL DAILY    Dispense:  48 g    Refill:  3   ibuprofen (ADVIL) 800 MG tablet    Sig: Take 1 tablet (800 mg total) by mouth every 8 (eight) hours as needed.    Dispense:  90 tablet    Refill:  0   losartan (COZAAR) 100 MG tablet    Sig: Take 1 tablet (100 mg total) by mouth daily.    Dispense:  90 tablet    Refill:  3    for future refills.   montelukast (SINGULAIR) 10 MG tablet    Sig: Take one tab po qd for sinus congestion    Dispense:  90 tablet    Refill:  3    For future refill   rosuvastatin (CRESTOR) 5 MG tablet    Sig: Take 1 tablet (5 mg total) by mouth daily.    Dispense:  90 tablet    Refill:  3    For future refills   Semaglutide (RYBELSUS) 7 MG TABS    Sig: Take 1 tablet (7 mg total) by mouth daily.    Dispense:  90 tablet    Refill:  3    For future refills    Return for previously scheduled, CPE, Ronald Mata PCP in august.   Total time spent:30 Minutes Time spent includes review of chart, medications, test results, and follow up plan with the patient.   [place] Controlled Substance Database was reviewed by me.  This patient was seen by Sallyanne Kuster, FNP-C in collaboration with Dr. Beverely Risen as a part of collaborative care agreement.   Jalynn Waddell R. Tedd Sias, MSN, FNP-C Internal medicine

## 2023-01-01 DIAGNOSIS — E039 Hypothyroidism, unspecified: Secondary | ICD-10-CM | POA: Diagnosis not present

## 2023-01-01 DIAGNOSIS — E1165 Type 2 diabetes mellitus with hyperglycemia: Secondary | ICD-10-CM | POA: Diagnosis not present

## 2023-01-01 DIAGNOSIS — E782 Mixed hyperlipidemia: Secondary | ICD-10-CM | POA: Diagnosis not present

## 2023-01-01 DIAGNOSIS — E559 Vitamin D deficiency, unspecified: Secondary | ICD-10-CM | POA: Diagnosis not present

## 2023-01-02 LAB — CMP14+EGFR
ALT: 37 IU/L (ref 0–44)
AST: 34 IU/L (ref 0–40)
Albumin/Globulin Ratio: 2.1
Albumin: 4.4 g/dL (ref 4.1–5.1)
Alkaline Phosphatase: 61 IU/L (ref 44–121)
BUN/Creatinine Ratio: 14 (ref 9–20)
BUN: 16 mg/dL (ref 6–24)
Bilirubin Total: 0.4 mg/dL (ref 0.0–1.2)
CO2: 25 mmol/L (ref 20–29)
Calcium: 9.4 mg/dL (ref 8.7–10.2)
Chloride: 100 mmol/L (ref 96–106)
Creatinine, Ser: 1.17 mg/dL (ref 0.76–1.27)
Globulin, Total: 2.1 g/dL (ref 1.5–4.5)
Glucose: 105 mg/dL — ABNORMAL HIGH (ref 70–99)
Potassium: 4.1 mmol/L (ref 3.5–5.2)
Sodium: 140 mmol/L (ref 134–144)
Total Protein: 6.5 g/dL (ref 6.0–8.5)
eGFR: 76 mL/min/{1.73_m2} (ref >59)

## 2023-01-02 LAB — LIPID PANEL
Chol/HDL Ratio: 5.4 ratio — ABNORMAL HIGH (ref 0.0–5.0)
Cholesterol, Total: 204 mg/dL — ABNORMAL HIGH (ref 100–199)
HDL: 38 mg/dL — ABNORMAL LOW (ref >39)
LDL Chol Calc (NIH): 154 mg/dL — ABNORMAL HIGH (ref 0–99)
Triglycerides: 66 mg/dL (ref 0–149)
VLDL Cholesterol Cal: 12 mg/dL (ref 5–40)

## 2023-01-02 LAB — CBC WITH DIFFERENTIAL/PLATELET
Basophils Absolute: 0 10*3/uL (ref 0.0–0.2)
Basos: 1 %
EOS (ABSOLUTE): 0 10*3/uL (ref 0.0–0.4)
Eos: 1 %
Hematocrit: 47 % (ref 37.5–51.0)
Hemoglobin: 15.6 g/dL (ref 13.0–17.7)
Immature Grans (Abs): 0 10*3/uL (ref 0.0–0.1)
Immature Granulocytes: 0 %
Lymphocytes Absolute: 1.9 10*3/uL (ref 0.7–3.1)
Lymphs: 52 %
MCH: 27.1 pg (ref 26.6–33.0)
MCHC: 33.2 g/dL (ref 31.5–35.7)
MCV: 82 fL (ref 79–97)
Monocytes Absolute: 0.4 10*3/uL (ref 0.1–0.9)
Monocytes: 10 %
Neutrophils Absolute: 1.3 10*3/uL — ABNORMAL LOW (ref 1.4–7.0)
Neutrophils: 36 %
Platelets: 252 10*3/uL (ref 150–450)
RBC: 5.75 x10E6/uL (ref 4.14–5.80)
RDW: 14.5 % (ref 11.6–15.4)
WBC: 3.6 10*3/uL (ref 3.4–10.8)

## 2023-01-02 LAB — TSH+FREE T4
Free T4: 1.41 ng/dL (ref 0.82–1.77)
TSH: 1.84 u[IU]/mL (ref 0.450–4.500)

## 2023-01-02 LAB — VITAMIN D 25 HYDROXY (VIT D DEFICIENCY, FRACTURES): Vit D, 25-Hydroxy: 15.7 ng/mL — ABNORMAL LOW (ref 30.0–100.0)

## 2023-01-17 ENCOUNTER — Other Ambulatory Visit: Payer: Self-pay | Admitting: Nurse Practitioner

## 2023-01-17 ENCOUNTER — Telehealth: Payer: Self-pay

## 2023-01-17 MED ORDER — VITAMIN D (ERGOCALCIFEROL) 1.25 MG (50000 UNIT) PO CAPS: 50000.0000 [IU] | ORAL_CAPSULE | ORAL | 1 refills | Status: DC

## 2023-01-17 NOTE — Progress Notes (Signed)
CMP and CBC are normal Cholesterol levels are abnormal but improving  Thyroid levels are normal  Vitamin D is very low, I have sent a weekly prescription supplement. We will repeat the vitamin D level in 6 weeks.

## 2023-01-17 NOTE — Telephone Encounter (Signed)
-----   Message from Sallyanne Kuster, NP sent at 01/17/2023  8:48 AM EDT ----- CMP and CBC are normal Cholesterol levels are abnormal but improving  Thyroid levels are normal  Vitamin D is very low, I have sent a weekly prescription supplement. We will repeat the vitamin D level in 6 weeks.

## 2023-01-17 NOTE — Telephone Encounter (Signed)
Pt notified for labs  

## 2023-02-06 DIAGNOSIS — M25531 Pain in right wrist: Secondary | ICD-10-CM | POA: Diagnosis not present

## 2023-02-06 DIAGNOSIS — M67833 Other specified disorders of tendon, right wrist: Secondary | ICD-10-CM | POA: Diagnosis not present

## 2023-03-08 ENCOUNTER — Other Ambulatory Visit: Payer: Self-pay | Admitting: Internal Medicine

## 2023-03-08 DIAGNOSIS — Z79899 Other long term (current) drug therapy: Secondary | ICD-10-CM

## 2023-03-14 ENCOUNTER — Other Ambulatory Visit: Payer: Self-pay | Admitting: Nurse Practitioner

## 2023-03-14 DIAGNOSIS — I10 Essential (primary) hypertension: Secondary | ICD-10-CM

## 2023-03-20 ENCOUNTER — Ambulatory Visit (INDEPENDENT_AMBULATORY_CARE_PROVIDER_SITE_OTHER): Payer: BC Managed Care – PPO | Admitting: Nurse Practitioner

## 2023-03-20 ENCOUNTER — Encounter: Payer: Self-pay | Admitting: Nurse Practitioner

## 2023-03-20 VITALS — BP 132/88 | HR 70 | Temp 98.5°F | Resp 16 | Ht 71.0 in | Wt 249.0 lb

## 2023-03-20 DIAGNOSIS — E782 Mixed hyperlipidemia: Secondary | ICD-10-CM

## 2023-03-20 DIAGNOSIS — E1165 Type 2 diabetes mellitus with hyperglycemia: Secondary | ICD-10-CM | POA: Diagnosis not present

## 2023-03-20 DIAGNOSIS — E039 Hypothyroidism, unspecified: Secondary | ICD-10-CM | POA: Diagnosis not present

## 2023-03-20 DIAGNOSIS — Z0001 Encounter for general adult medical examination with abnormal findings: Secondary | ICD-10-CM

## 2023-03-20 DIAGNOSIS — I10 Essential (primary) hypertension: Secondary | ICD-10-CM | POA: Diagnosis not present

## 2023-03-20 DIAGNOSIS — R300 Dysuria: Secondary | ICD-10-CM

## 2023-03-20 NOTE — Progress Notes (Signed)
[facility] [address] [place], [place] [address]  Internal MEDICINE  Office Visit Note  Patient Name: Ronald Mata  [id number]  [id number]  Date of Service: 03/20/2023  Chief Complaint  Patient presents with   Diabetes   Hypertension   Annual Exam    HPI Ronald Mata presents for an annual well visit and physical exam.  Well-appearing 52 y.o. male with arthritis, diabetes, hypertension, hypothyroidism, and a heart murmur. Heart murmur not appreciated today.  Routine CRC screening: due in 2027 Eye exam: done in November 2023, schedule for eye exam in October this year foot exam: done today Labs: labs done in may and already discussed.  Low vitamin D -- taking weekly prescription supplement Cholesterol still elevated but improving.  Last A1c was 6.2 which is stable with no change, not due to repeat for 3 more months.  New or worsening pain: none  Other concerns: none     Current Medication: Outpatient Encounter Medications as of 03/20/2023  Medication Sig   albuterol (VENTOLIN HFA) 108 (90 Base) MCG/ACT inhaler Inhale 2 puffs into the lungs every 6 (six) hours as needed for wheezing or shortness of breath.   amLODipine (NORVASC) 5 MG tablet Take 1 tablet (5 mg total) by mouth daily.   cetirizine (ZYRTEC) 10 MG tablet TAKE 1 TABLET(10 MG) BY MOUTH DAILY   co-enzyme Q-10 30 MG capsule Take 30 mg by mouth daily.   dapagliflozin propanediol (FARXIGA) 10 MG TABS tablet TAKE 1 TABLET(10 MG) BY MOUTH DAILY BEFORE BREAKFAST   ELDERBERRY PO Take by mouth daily.   febuxostat (ULORIC) 40 MG tablet TAKE 1 TABLET(40 MG) BY MOUTH DAILY   fluticasone (FLONASE) 50 MCG/ACT nasal spray SHAKE LIQUID AND USE 2 SPRAYS IN EACH NOSTRIL DAILY   hydrochlorothiazide (HYDRODIURIL) 25 MG tablet TAKE 1 TABLET BY MOUTH EVERY DAY   ibuprofen (ADVIL) 800 MG tablet Take 1 tablet (800 mg total) by mouth every 8 (eight) hours as needed.   Lancets (ONETOUCH ULTRASOFT) lancets Blood sugar testing daily -  E11.65   levothyroxine (SYNTHROID) 50 MCG tablet TAKE 1 TABLET(50 MCG) BY MOUTH DAILY BEFORE BREAKFAST   losartan (COZAAR) 100 MG tablet TAKE 1 TABLET(100 MG) BY MOUTH DAILY   methocarbamol (ROBAXIN) 500 MG tablet Take 1 tablet (500 mg total) by mouth every 8 (eight) hours as needed for muscle spasms.   montelukast (SINGULAIR) 10 MG tablet Take one tab po qd for sinus congestion   rosuvastatin (CRESTOR) 5 MG tablet Take 1 tablet (5 mg total) by mouth daily.   Semaglutide (RYBELSUS) 7 MG TABS Take 1 tablet (7 mg total) by mouth daily.   sildenafil (VIAGRA) 100 MG tablet Take 0.5-1 tablets (50-100 mg total) by mouth daily as needed for erectile dysfunction.   Vitamin D, Ergocalciferol, (DRISDOL) 1.25 MG (50000 UNIT) CAPS capsule Take 1 capsule (50,000 Units total) by mouth every 7 (seven) days.   No facility-administered encounter medications on file as of 03/20/2023.    Surgical History: Past Surgical History:  Procedure Laterality Date   CARDIAC CATHETERIZATION     15 years ago no stents   COLONOSCOPY WITH PROPOFOL N/A 10/15/2020   Procedure: COLONOSCOPY WITH PROPOFOL;  Surgeon: Midge Minium, MD;  Location: [facility];  Service: Endoscopy;  Laterality: N/A;  Diabetic   POLYPECTOMY  10/15/2020   Procedure: POLYPECTOMY;  Surgeon: Midge Minium, MD;  Location: [facility];  Service: Endoscopy;;   stomach wrap and achalasia      Medical History: Past Medical  History:  Diagnosis Date   Arthritis    feet   Diabetes mellitus without complication (HCC)    Heart murmur    Hypertension    Thyroid disease     Family History: Family History  Problem Relation Age of Onset   Cancer Mother    Diabetes Mother    Hypertension Mother    Hyperlipidemia Mother    Stroke Mother    Osteoarthritis Mother    Diabetes Father    Osteoarthritis Father    Glaucoma Father     Social History   Socioeconomic History   Marital status: Married    Spouse name: Not on file   Number  of children: Not on file   Years of education: Not on file   Highest education level: Not on file  Occupational History   Not on file  Tobacco Use   Smoking status: Never   Smokeless tobacco: Never  Vaping Use   Vaping status: Never Used  Substance and Sexual Activity   Alcohol use: No    Alcohol/week: 0.0 standard drinks of alcohol   Drug use: No   Sexual activity: Not on file  Other Topics Concern   Not on file  Social History Narrative   Not on file   Social Determinants of Health   Financial Resource Strain: Not on file  Food Insecurity: Not on file  Transportation Needs: Not on file  Physical Activity: Not on file  Stress: Not on file  Social Connections: Not on file  Intimate Partner Violence: Not on file      Review of Systems  Constitutional:  Negative for activity change, appetite change, chills, fatigue, fever and unexpected weight change.  HENT: Negative.  Negative for congestion, ear pain, rhinorrhea, sore throat and trouble swallowing.   Eyes: Negative.   Respiratory: Negative.  Negative for cough, chest tightness, shortness of breath and wheezing.   Cardiovascular: Negative.  Negative for chest pain and palpitations.  Gastrointestinal: Negative.  Negative for abdominal pain, blood in stool, constipation, diarrhea, nausea and vomiting.  Endocrine: Negative.   Genitourinary: Negative.  Negative for difficulty urinating, dysuria, frequency, hematuria and urgency.  Musculoskeletal: Negative.  Negative for arthralgias, back pain, joint swelling, myalgias and neck pain.  Skin: Negative.  Negative for rash and wound.  Allergic/Immunologic: Negative.  Negative for immunocompromised state.  Neurological: Negative.  Negative for dizziness, seizures, numbness and headaches.  Hematological: Negative.   Psychiatric/Behavioral: Negative.  Negative for behavioral problems, self-injury and suicidal ideas. The patient is not nervous/anxious.     Vital Signs: BP 132/88    Pulse 70   Temp 98.5 F (36.9 C)   Resp 16   Ht 5\' 11"  (1.803 m)   Wt 249 lb (112.9 kg)   SpO2 97%   BMI 34.73 kg/m    Physical Exam Vitals reviewed.  Constitutional:      General: He is awake. He is not in acute distress.    Appearance: Normal appearance. He is well-developed and well-groomed. He is obese. He is not ill-appearing or diaphoretic.  HENT:     Head: Normocephalic and atraumatic.     Right Ear: Tympanic membrane, ear canal and external ear normal.     Left Ear: Tympanic membrane, ear canal and external ear normal.     Nose: Nose normal. No congestion or rhinorrhea.     Mouth/Throat:     Lips: Pink.     Mouth: Mucous membranes are moist.     Pharynx: Oropharynx is  clear. Uvula midline. No oropharyngeal exudate or posterior oropharyngeal erythema.  Eyes:     General: Lids are normal. Vision grossly intact. Gaze aligned appropriately. No scleral icterus.       Right eye: No discharge.        Left eye: No discharge.     Extraocular Movements: Extraocular movements intact.     Conjunctiva/sclera: Conjunctivae normal.     Pupils: Pupils are equal, round, and reactive to light.     Funduscopic exam:    Right eye: Red reflex present.        Left eye: Red reflex present. Neck:     Thyroid: No thyromegaly.     Vascular: No JVD.     Trachea: No tracheal deviation.  Cardiovascular:     Rate and Rhythm: Normal rate and regular rhythm.     Pulses:          Carotid pulses are 3+ on the right side and 3+ on the left side.      Radial pulses are 2+ on the right side and 2+ on the left side.       Dorsalis pedis pulses are 3+ on the right side and 3+ on the left side.       Posterior tibial pulses are 3+ on the right side and 3+ on the left side.     Heart sounds: Normal heart sounds, S1 normal and S2 normal. No murmur heard.    No friction rub. No gallop.  Pulmonary:     Effort: Pulmonary effort is normal. No accessory muscle usage or respiratory distress.     Breath  sounds: Normal breath sounds and air entry. No stridor. No wheezing or rales.  Chest:     Chest wall: No tenderness.  Abdominal:     General: Bowel sounds are normal. There is no distension.     Palpations: Abdomen is soft. There is no shifting dullness, fluid wave, mass or pulsatile mass.     Tenderness: There is no abdominal tenderness. There is no guarding or rebound.  Musculoskeletal:        General: No tenderness or deformity. Normal range of motion.     Cervical back: Normal range of motion and neck supple.     Right lower leg: No edema.     Left lower leg: No edema.     Right foot: Normal range of motion. No deformity, bunion, Charcot foot, foot drop or prominent metatarsal heads.     Left foot: Normal range of motion. No deformity, bunion, Charcot foot, foot drop or prominent metatarsal heads.  Feet:     Right foot:     Protective Sensation: 6 sites tested.  6 sites sensed.     Skin integrity: Dry skin present. No ulcer, blister, skin breakdown, erythema, warmth, callus or fissure.     Toenail Condition: Right toenails are abnormally thick.     Left foot:     Protective Sensation: 6 sites tested.  6 sites sensed.     Skin integrity: Dry skin present. No ulcer, blister, skin breakdown, erythema, warmth, callus or fissure.     Toenail Condition: Left toenails are abnormally thick.  Lymphadenopathy:     Cervical: No cervical adenopathy.  Skin:    General: Skin is warm and dry.     Capillary Refill: Capillary refill takes less than 2 seconds.     Coloration: Skin is not pale.     Findings: No erythema or rash.  Neurological:  Mental Status: He is alert and oriented to person, place, and time.     Cranial Nerves: No cranial nerve deficit.     Motor: No abnormal muscle tone.     Coordination: Coordination normal.     Gait: Gait normal.     Deep Tendon Reflexes: Reflexes are normal and symmetric.  Psychiatric:        Mood and Affect: Mood normal.        Behavior: Behavior  normal. Behavior is cooperative.        Thought Content: Thought content normal.        Judgment: Judgment normal.        Assessment/Plan: 1. Encounter for routine adult health examination with abnormal findings Age-appropriate preventive screenings and vaccinations discussed, annual physical exam completed. Routine labs for health maintenance done in may and results have been discussed with patients. PHM updated.   2. Type 2 diabetes mellitus with hyperglycemia, without long-term current use of insulin (HCC) Stable, continue current medications. Repeat A1c in 3 months   3. Essential hypertension Stable, Continue medications as prescribed   4. Acquired hypothyroidism Stable, Continue levothyroxine as prescribed.   5. Mixed hyperlipidemia Improving, continue current medication and diet modifications as prescribed.   6. Dysuria Routine urinalysis done - Urinalysis, Routine w reflex microscopic     General Counseling: Ronald Mata verbalizes understanding of the findings of todays visit and agrees with plan of treatment. I have discussed any further diagnostic evaluation that may be needed or ordered today. We also reviewed his medications today. he has been encouraged to call the office with any questions or concerns that should arise related to todays visit.    Orders Placed This Encounter  Procedures   Urinalysis, Routine w reflex microscopic    No orders of the defined types were placed in this encounter.   Return in about 3 months (around 06/20/2023) for F/U, Recheck A1C, Arlen Legendre PCP.   Total time spent:30 Minutes Time spent includes review of chart, medications, test results, and follow up plan with the patient.   [place] Controlled Substance Database was reviewed by me.  This patient was seen by Sallyanne Kuster, FNP-C in collaboration with Dr. Beverely Risen as a part of collaborative care agreement.  Jehu Mccauslin R. Tedd Sias, MSN, FNP-C Internal medicine

## 2023-03-21 LAB — MICROSCOPIC EXAMINATION
Bacteria, UA: NONE SEEN
Casts: NONE SEEN /LPF

## 2023-03-21 LAB — URINALYSIS, ROUTINE W REFLEX MICROSCOPIC
Bilirubin, UA: NEGATIVE
Leukocytes,UA: NEGATIVE
Nitrite, UA: NEGATIVE
RBC, UA: NEGATIVE
Specific Gravity, UA: >=1.03 — AB (ref 1.005–1.030)
Urobilinogen, Ur: 0.2 mg/dL (ref 0.2–1.0)
pH, UA: 5.5 (ref 5.0–7.5)

## 2023-03-24 ENCOUNTER — Other Ambulatory Visit: Payer: Self-pay | Admitting: Internal Medicine

## 2023-03-24 DIAGNOSIS — J45909 Unspecified asthma, uncomplicated: Secondary | ICD-10-CM

## 2023-04-12 ENCOUNTER — Other Ambulatory Visit: Payer: Self-pay | Admitting: Nurse Practitioner

## 2023-04-12 DIAGNOSIS — Z79899 Other long term (current) drug therapy: Secondary | ICD-10-CM

## 2023-06-11 ENCOUNTER — Ambulatory Visit (INDEPENDENT_AMBULATORY_CARE_PROVIDER_SITE_OTHER): Payer: BC Managed Care – PPO | Admitting: Internal Medicine

## 2023-06-11 ENCOUNTER — Encounter: Payer: Self-pay | Admitting: Internal Medicine

## 2023-06-11 VITALS — BP 129/100 | HR 78 | Temp 98.3°F | Resp 16 | Ht 71.0 in | Wt 253.2 lb

## 2023-06-11 DIAGNOSIS — H10531 Contact blepharoconjunctivitis, right eye: Secondary | ICD-10-CM

## 2023-06-11 DIAGNOSIS — I10 Essential (primary) hypertension: Secondary | ICD-10-CM | POA: Diagnosis not present

## 2023-06-11 DIAGNOSIS — H00033 Abscess of eyelid right eye, unspecified eyelid: Secondary | ICD-10-CM | POA: Diagnosis not present

## 2023-06-11 MED ORDER — NEOMYCIN-BACITRACIN ZN-POLYMYX 5-400-10000 OP OINT
TOPICAL_OINTMENT | OPHTHALMIC | 0 refills | Status: DC
Start: 2023-06-11 — End: 2023-10-17

## 2023-06-11 MED ORDER — AMOXICILLIN-POT CLAVULANATE 875-125 MG PO TABS
1.0000 | ORAL_TABLET | Freq: Two times a day (BID) | ORAL | 0 refills | Status: DC
Start: 2023-06-11 — End: 2023-06-20

## 2023-06-11 NOTE — Progress Notes (Signed)
[facility] [address] [place], [place] [address]  Internal MEDICINE  Office Visit Note  Patient Name: Ronald Mata  [id number]  [id number]  Date of Service: 06/11/2023  Chief Complaint  Patient presents with   Acute Visit   Eyelid Problem    Right eye irritation and swelling, sensitive to light and some white discharge   Hypertension    HPI Patient seen for acute visit Suffers from extreme dryness of eyes, has appointment with ophthalmology Eyes for itching and he thinks he might have rubbed it woke up with swelling of the right eye with redness Denies any fever or chills There is a slight discharge    Current Medication: Outpatient Encounter Medications as of 06/11/2023  Medication Sig   albuterol (VENTOLIN HFA) 108 (90 Base) MCG/ACT inhaler INHALE 2 PUFFS INTO THE LUNGS EVERY 6 HOURS AS NEEDED FOR WHEEZING OR SHORTNESS OF BREATH   amLODipine (NORVASC) 5 MG tablet Take 1 tablet (5 mg total) by mouth daily.   amoxicillin-clavulanate (AUGMENTIN) 875-125 MG tablet Take 1 tablet by mouth 2 (two) times daily.   cetirizine (ZYRTEC) 10 MG tablet TAKE 1 TABLET(10 MG) BY MOUTH DAILY   co-enzyme Q-10 30 MG capsule Take 30 mg by mouth daily.   dapagliflozin propanediol (FARXIGA) 10 MG TABS tablet TAKE 1 TABLET(10 MG) BY MOUTH DAILY BEFORE BREAKFAST   ELDERBERRY PO Take by mouth daily.   febuxostat (ULORIC) 40 MG tablet TAKE 1 TABLET(40 MG) BY MOUTH DAILY   fluticasone (FLONASE) 50 MCG/ACT nasal spray SHAKE LIQUID AND USE 2 SPRAYS IN EACH NOSTRIL DAILY   hydrochlorothiazide (HYDRODIURIL) 25 MG tablet TAKE 1 TABLET BY MOUTH EVERY DAY   ibuprofen (ADVIL) 800 MG tablet Take 1 tablet (800 mg total) by mouth every 8 (eight) hours as needed.   Lancets (ONETOUCH ULTRASOFT) lancets Blood sugar testing daily - E11.65   levothyroxine (SYNTHROID) 50 MCG tablet TAKE 1 TABLET(50 MCG) BY MOUTH DAILY BEFORE BREAKFAST   losartan (COZAAR) 100 MG tablet TAKE 1 TABLET(100 MG) BY MOUTH  DAILY   methocarbamol (ROBAXIN) 500 MG tablet Take 1 tablet (500 mg total) by mouth every 8 (eight) hours as needed for muscle spasms.   montelukast (SINGULAIR) 10 MG tablet Take one tab po qd for sinus congestion   neomycin-bacitracin-polymyxin (NEOSPORIN) ophthalmic ointment Apply q-tip size to the left eye at night only for 7 nights   rosuvastatin (CRESTOR) 5 MG tablet Take 1 tablet (5 mg total) by mouth daily.   Semaglutide (RYBELSUS) 7 MG TABS Take 1 tablet (7 mg total) by mouth daily.   sildenafil (VIAGRA) 100 MG tablet Take 0.5-1 tablets (50-100 mg total) by mouth daily as needed for erectile dysfunction.   Vitamin D, Ergocalciferol, (DRISDOL) 1.25 MG (50000 UNIT) CAPS capsule Take 1 capsule (50,000 Units total) by mouth every 7 (seven) days.   No facility-administered encounter medications on file as of 06/11/2023.    Surgical History: Past Surgical History:  Procedure Laterality Date   CARDIAC CATHETERIZATION     15 years ago no stents   COLONOSCOPY WITH PROPOFOL N/A 10/15/2020   Procedure: COLONOSCOPY WITH PROPOFOL;  Surgeon: Midge Minium, MD;  Location: [facility];  Service: Endoscopy;  Laterality: N/A;  Diabetic   POLYPECTOMY  10/15/2020   Procedure: POLYPECTOMY;  Surgeon: Midge Minium, MD;  Location: [facility];  Service: Endoscopy;;   stomach wrap and achalasia      Medical History: Past Medical History:  Diagnosis Date   Arthritis  feet   Diabetes mellitus without complication (HCC)    Heart murmur    Hypertension    Thyroid disease     Family History: Family History  Problem Relation Age of Onset   Cancer Mother    Diabetes Mother    Hypertension Mother    Hyperlipidemia Mother    Stroke Mother    Osteoarthritis Mother    Diabetes Father    Osteoarthritis Father    Glaucoma Father     Social History   Socioeconomic History   Marital status: Married    Spouse name: Not on file   Number of children: Not on file   Years of  education: Not on file   Highest education level: Not on file  Occupational History   Not on file  Tobacco Use   Smoking status: Never   Smokeless tobacco: Never  Vaping Use   Vaping status: Never Used  Substance and Sexual Activity   Alcohol use: No    Alcohol/week: 0.0 standard drinks of alcohol   Drug use: No   Sexual activity: Not on file  Other Topics Concern   Not on file  Social History Narrative   Not on file   Social Determinants of Health   Financial Resource Strain: Not on file  Food Insecurity: Not on file  Transportation Needs: Not on file  Physical Activity: Not on file  Stress: Not on file  Social Connections: Not on file  Intimate Partner Violence: Not on file      Review of Systems  Constitutional:  Negative for fatigue and fever.  HENT:  Negative for congestion, mouth sores and postnasal drip.   Eyes:  Positive for photophobia, discharge and itching.       Swelling of right upper eyelid  Respiratory:  Negative for cough.   Cardiovascular:  Negative for chest pain.       Elevated blood pressure  Genitourinary:  Negative for flank pain.  Psychiatric/Behavioral: Negative.      Vital Signs: BP (!) 129/100   Pulse 78   Temp 98.3 F (36.8 C)   Resp 16   Ht 5\' 11"  (1.803 m)   Wt 253 lb 3.2 oz (114.9 kg)   SpO2 97%   BMI 35.31 kg/m    Physical Exam Constitutional:      Appearance: Normal appearance.  HENT:     Head: Normocephalic and atraumatic.  Eyes:     General:        Right eye: Discharge present.     Pupils: Pupils are equal, round, and reactive to light.     Comments: Right upper eyelid shows soft tissue swelling, inner canthus for right eye is also swollen and edematous with erythema and discharge, sclera is not inflamed  Neurological:     General: No focal deficit present.     Mental Status: He is oriented to person, place, and time.        Assessment/Plan: 1. Cellulitis of right eyelid Start therapy as prescribed today  patient has to follow-up with ophthalmology - amoxicillin-clavulanate (AUGMENTIN) 875-125 MG tablet; Take 1 tablet by mouth 2 (two) times daily.  Dispense: 14 tablet; Refill: 0  2. Contact blepharoconjunctivitis of right eye Does suffer from dry eyes explore into artificial tears/lubricant or even getting silicone implants for lacrimal duct bilaterally - neomycin-bacitracin-polymyxin (NEOSPORIN) ophthalmic ointment; Apply q-tip size to the left eye at night only for 7 nights  Dispense: 3.5 g; Refill: 0  3. Uncontrolled hypertension Patient is on  triple therapy for hypertension however he had stopped taking amlodipine and noticed that her blood pressure was rising so he went back on his amlodipine losartan and hydrochlorothiazide. Patient was also instructed to monitor his blood pressure at home if needed we will optimize therapy  General Counseling: Seymour verbalizes understanding of the findings of todays visit and agrees with plan of treatment. I have discussed any further diagnostic evaluation that may be needed or ordered today. We also reviewed his medications today. he has been encouraged to call the office with any questions or concerns that should arise related to todays visit.    No orders of the defined types were placed in this encounter.   Meds ordered this encounter  Medications   amoxicillin-clavulanate (AUGMENTIN) 875-125 MG tablet    Sig: Take 1 tablet by mouth 2 (two) times daily.    Dispense:  14 tablet    Refill:  0   neomycin-bacitracin-polymyxin (NEOSPORIN) ophthalmic ointment    Sig: Apply q-tip size to the left eye at night only for 7 nights    Dispense:  3.5 g    Refill:  0    Total time spent:25 Minutes Time spent includes review of chart, medications, test results, and follow up plan with the patient.   [place] Controlled Substance Database was reviewed by me.   Dr Lyndon Code Internal medicine

## 2023-06-12 ENCOUNTER — Other Ambulatory Visit: Payer: Self-pay

## 2023-06-12 MED ORDER — OSELTAMIVIR PHOSPHATE 75 MG PO CAPS: 75.0000 mg | ORAL_CAPSULE | Freq: Two times a day (BID) | ORAL | 0 refills | Status: DC

## 2023-06-20 ENCOUNTER — Ambulatory Visit (INDEPENDENT_AMBULATORY_CARE_PROVIDER_SITE_OTHER): Payer: BC Managed Care – PPO | Admitting: Nurse Practitioner

## 2023-06-20 ENCOUNTER — Encounter: Payer: Self-pay | Admitting: Nurse Practitioner

## 2023-06-20 VITALS — BP 132/75 | HR 69 | Temp 98.3°F | Resp 16 | Ht 71.0 in | Wt 256.6 lb

## 2023-06-20 DIAGNOSIS — E1165 Type 2 diabetes mellitus with hyperglycemia: Secondary | ICD-10-CM | POA: Diagnosis not present

## 2023-06-20 DIAGNOSIS — E785 Hyperlipidemia, unspecified: Secondary | ICD-10-CM | POA: Diagnosis not present

## 2023-06-20 DIAGNOSIS — I152 Hypertension secondary to endocrine disorders: Secondary | ICD-10-CM | POA: Diagnosis not present

## 2023-06-20 DIAGNOSIS — E1159 Type 2 diabetes mellitus with other circulatory complications: Secondary | ICD-10-CM

## 2023-06-20 DIAGNOSIS — E1169 Type 2 diabetes mellitus with other specified complication: Secondary | ICD-10-CM

## 2023-06-20 LAB — POCT GLYCOSYLATED HEMOGLOBIN (HGB A1C): Hemoglobin A1C: 6.2 % — AB (ref 4.0–5.6)

## 2023-06-20 MED ORDER — ONETOUCH VERIO REFLECT W/DEVICE KIT: 1.0000 | PACK | Freq: Once | 0 refills | Status: AC

## 2023-06-20 MED ORDER — ONETOUCH VERIO VI STRP: ORAL_STRIP | 12 refills | Status: AC

## 2023-06-20 MED ORDER — ONETOUCH DELICA PLUS LANCET33G MISC: 11 refills | Status: AC

## 2023-06-20 NOTE — Progress Notes (Signed)
[facility] [address] [place], [place] [address]  Internal MEDICINE  Office Visit Note  Patient Name: Ronald Mata  [id number]  [id number]  Date of Service: 06/20/2023  Chief Complaint  Patient presents with   Diabetes   Hypertension   Follow-up    HPI Ronald Mata presents for a follow-up visit for diabetes, hypertension and high cholesterol.  Diabetes -- A1c remains stable and unchanged. Due for urine microalbumin/creatinine ratio testing.  DBP has been elevated in the low 100s while he was sick with flu and URI. This has resolved and the DBP has improved per patient back down to the 80s.  Elevated BP, improved when rechecked. Currently on losartan and hydrochlorothiazide. Not taking amlodipine right now but has it on hand in case his BP starts going up again.  High cholesterol -- taking rosuvastatin     Current Medication: Outpatient Encounter Medications as of 06/20/2023  Medication Sig   albuterol (VENTOLIN HFA) 108 (90 Base) MCG/ACT inhaler INHALE 2 PUFFS INTO THE LUNGS EVERY 6 HOURS AS NEEDED FOR WHEEZING OR SHORTNESS OF BREATH   amLODipine (NORVASC) 5 MG tablet Take 1 tablet (5 mg total) by mouth daily.   Blood Glucose Monitoring Suppl (ONETOUCH VERIO REFLECT) w/Device KIT 1 Device by Does not apply route once for 1 dose.   cetirizine (ZYRTEC) 10 MG tablet TAKE 1 TABLET(10 MG) BY MOUTH DAILY   co-enzyme Q-10 30 MG capsule Take 30 mg by mouth daily.   dapagliflozin propanediol (FARXIGA) 10 MG TABS tablet TAKE 1 TABLET(10 MG) BY MOUTH DAILY BEFORE BREAKFAST   ELDERBERRY PO Take by mouth daily.   febuxostat (ULORIC) 40 MG tablet TAKE 1 TABLET(40 MG) BY MOUTH DAILY   fluticasone (FLONASE) 50 MCG/ACT nasal spray SHAKE LIQUID AND USE 2 SPRAYS IN EACH NOSTRIL DAILY   glucose blood (ONETOUCH VERIO) test strip Use 1 test strip to check glucose twice daily for diabetes E11.65   hydrochlorothiazide (HYDRODIURIL) 25 MG tablet TAKE 1 TABLET BY MOUTH EVERY DAY   ibuprofen  (ADVIL) 800 MG tablet Take 1 tablet (800 mg total) by mouth every 8 (eight) hours as needed.   Lancets (ONETOUCH DELICA PLUS LANCET33G) MISC Use 1 lancet to check glucose twice daily for diabetes E11.65   levothyroxine (SYNTHROID) 50 MCG tablet TAKE 1 TABLET(50 MCG) BY MOUTH DAILY BEFORE BREAKFAST   losartan (COZAAR) 100 MG tablet TAKE 1 TABLET(100 MG) BY MOUTH DAILY   methocarbamol (ROBAXIN) 500 MG tablet Take 1 tablet (500 mg total) by mouth every 8 (eight) hours as needed for muscle spasms.   montelukast (SINGULAIR) 10 MG tablet Take one tab po qd for sinus congestion   neomycin-bacitracin-polymyxin (NEOSPORIN) ophthalmic ointment Apply q-tip size to the left eye at night only for 7 nights   oseltamivir (TAMIFLU) 75 MG capsule Take 1 capsule (75 mg total) by mouth 2 (two) times daily.   rosuvastatin (CRESTOR) 5 MG tablet Take 1 tablet (5 mg total) by mouth daily.   Semaglutide (RYBELSUS) 7 MG TABS Take 1 tablet (7 mg total) by mouth daily.   sildenafil (VIAGRA) 100 MG tablet Take 0.5-1 tablets (50-100 mg total) by mouth daily as needed for erectile dysfunction.   Vitamin D, Ergocalciferol, (DRISDOL) 1.25 MG (50000 UNIT) CAPS capsule Take 1 capsule (50,000 Units total) by mouth every 7 (seven) days.   [DISCONTINUED] amoxicillin-clavulanate (AUGMENTIN) 875-125 MG tablet Take 1 tablet by mouth 2 (two) times daily.   [DISCONTINUED] Lancets (ONETOUCH ULTRASOFT) lancets Blood sugar testing daily - E11.65  No facility-administered encounter medications on file as of 06/20/2023.    Surgical History: Past Surgical History:  Procedure Laterality Date   CARDIAC CATHETERIZATION     15 years ago no stents   COLONOSCOPY WITH PROPOFOL N/A 10/15/2020   Procedure: COLONOSCOPY WITH PROPOFOL;  Surgeon: Midge Minium, MD;  Location: [facility];  Service: Endoscopy;  Laterality: N/A;  Diabetic   POLYPECTOMY  10/15/2020   Procedure: POLYPECTOMY;  Surgeon: Midge Minium, MD;  Location: [facility];  Service: Endoscopy;;   stomach wrap and achalasia      Medical History: Past Medical History:  Diagnosis Date   Arthritis    feet   Diabetes mellitus without complication (HCC)    Heart murmur    Hypertension    Thyroid disease     Family History: Family History  Problem Relation Age of Onset   Cancer Mother    Diabetes Mother    Hypertension Mother    Hyperlipidemia Mother    Stroke Mother    Osteoarthritis Mother    Diabetes Father    Osteoarthritis Father    Glaucoma Father     Social History   Socioeconomic History   Marital status: Married    Spouse name: Not on file   Number of children: Not on file   Years of education: Not on file   Highest education level: Not on file  Occupational History   Not on file  Tobacco Use   Smoking status: Never   Smokeless tobacco: Never  Vaping Use   Vaping status: Never Used  Substance and Sexual Activity   Alcohol use: No    Alcohol/week: 0.0 standard drinks of alcohol   Drug use: No   Sexual activity: Not on file  Other Topics Concern   Not on file  Social History Narrative   Not on file   Social Determinants of Health   Financial Resource Strain: Not on file  Food Insecurity: Not on file  Transportation Needs: Not on file  Physical Activity: Not on file  Stress: Not on file  Social Connections: Not on file  Intimate Partner Violence: Not on file      Review of Systems  Constitutional:  Negative for chills, fatigue and unexpected weight change.  HENT:  Negative for congestion, rhinorrhea, sneezing and sore throat.   Eyes:  Negative for redness.  Respiratory: Negative.  Negative for cough, chest tightness and shortness of breath.   Cardiovascular: Negative.  Negative for chest pain and palpitations.  Gastrointestinal:  Negative for abdominal pain, constipation, diarrhea, nausea and vomiting.  Genitourinary:  Negative for dysuria and frequency.  Musculoskeletal:  Negative for arthralgias, back  pain, joint swelling and neck pain.  Skin:  Negative for rash.  Neurological: Negative.  Negative for tremors and numbness.  Hematological:  Negative for adenopathy. Does not bruise/bleed easily.  Psychiatric/Behavioral:  Negative for behavioral problems (Depression), sleep disturbance and suicidal ideas. The patient is not nervous/anxious.     Vital Signs: BP 132/75 Comment: 138/98  Pulse 69   Temp 98.3 F (36.8 C)   Resp 16   Ht 5\' 11"  (1.803 m)   Wt 256 lb 9.6 oz (116.4 kg)   SpO2 99%   BMI 35.79 kg/m    Physical Exam Vitals reviewed.  Constitutional:      General: He is not in acute distress.    Appearance: Normal appearance. He is obese. He is not ill-appearing.  HENT:     Head: Normocephalic and atraumatic.  Eyes:     Pupils: Pupils are equal, round, and reactive to light.  Cardiovascular:     Rate and Rhythm: Normal rate and regular rhythm.  Pulmonary:     Effort: Pulmonary effort is normal. No respiratory distress.  Neurological:     Mental Status: He is alert and oriented to person, place, and time.  Psychiatric:        Mood and Affect: Mood normal.        Behavior: Behavior normal.        Assessment/Plan: 1. Type 2 diabetes mellitus with other circulatory complication, without long-term current use of insulin (HCC) A1c remains stable and unchanged. Needs new meter, device kit, strips and lancets ordered. Follow up in 4 months for repeat A1c.  - POCT glycosylated hemoglobin (Hb A1C) - Lancets (ONETOUCH DELICA PLUS LANCET33G) MISC; Use 1 lancet to check glucose twice daily for diabetes E11.65  Dispense: 100 each; Refill: 11 - Blood Glucose Monitoring Suppl (ONETOUCH VERIO REFLECT) w/Device KIT; 1 Device by Does not apply route once for 1 dose.  Dispense: 1 kit; Refill: 0 - glucose blood (ONETOUCH VERIO) test strip; Use 1 test strip to check glucose twice daily for diabetes E11.65  Dispense: 100 each; Refill: 12  2. Hypertension associated with diabetes  (HCC) Continue losartan and hydrochlorothiazide as prescribed. Keep amlodipine available if BP starts rising again.   3. Hyperlipidemia associated with type 2 diabetes mellitus (HCC) Continue rosuvastatin as prescribed.    General Counseling: Ronald Mata verbalizes understanding of the findings of todays visit and agrees with plan of treatment. I have discussed any further diagnostic evaluation that may be needed or ordered today. We also reviewed his medications today. he has been encouraged to call the office with any questions or concerns that should arise related to todays visit.    Orders Placed This Encounter  Procedures   POCT glycosylated hemoglobin (Hb A1C)    Meds ordered this encounter  Medications   Lancets (ONETOUCH DELICA PLUS LANCET33G) MISC    Sig: Use 1 lancet to check glucose twice daily for diabetes E11.65    Dispense:  100 each    Refill:  11    Dx code E11.65   Blood Glucose Monitoring Suppl (ONETOUCH VERIO REFLECT) w/Device KIT    Sig: 1 Device by Does not apply route once for 1 dose.    Dispense:  1 kit    Refill:  0    Dx code E11.65   glucose blood (ONETOUCH VERIO) test strip    Sig: Use 1 test strip to check glucose twice daily for diabetes E11.65    Dispense:  100 each    Refill:  12    Dx code E11.65    Return in about 4 months (around 10/18/2023) for F/U, Recheck A1C, [place] F[place]ock PCP.   Total time spent:30 Minutes Time spent includes review of chart, medications, test results, and follow up plan with the patient.   [place] Controlled Substance Database was reviewed by me.  This patient was seen by Sallyanne Kuster, FNP-C in collaboration with Dr. Beverely Risen as a part of collaborative care agreement.   Anie Juniel R. Tedd Sias, MSN, FNP-C Internal medicine

## 2023-06-25 ENCOUNTER — Ambulatory Visit: Payer: BC Managed Care – PPO

## 2023-06-25 DIAGNOSIS — E1159 Type 2 diabetes mellitus with other circulatory complications: Secondary | ICD-10-CM

## 2023-06-26 LAB — MICROALBUMIN / CREATININE URINE RATIO
Creatinine, Urine: 41.9 mg/dL
Microalb/Creat Ratio: 71 mg/g{creat} — ABNORMAL HIGH (ref 0–29)
Microalbumin, Urine: 29.6 ug/mL

## 2023-07-06 ENCOUNTER — Other Ambulatory Visit: Payer: Self-pay | Admitting: Nurse Practitioner

## 2023-07-11 DIAGNOSIS — H35033 Hypertensive retinopathy, bilateral: Secondary | ICD-10-CM | POA: Diagnosis not present

## 2023-07-12 NOTE — Progress Notes (Signed)
Slightly increased microalbuminuria, which is protein in the urine. Kidney function labs have been normal. No interventions at this time, will continue to monitor periodically.

## 2023-08-02 ENCOUNTER — Other Ambulatory Visit: Payer: Self-pay | Admitting: Internal Medicine

## 2023-08-02 DIAGNOSIS — E1165 Type 2 diabetes mellitus with hyperglycemia: Secondary | ICD-10-CM

## 2023-08-02 NOTE — Telephone Encounter (Signed)
 Please review not on med list

## 2023-09-23 ENCOUNTER — Other Ambulatory Visit: Payer: Self-pay | Admitting: Nurse Practitioner

## 2023-09-23 DIAGNOSIS — Z760 Encounter for issue of repeat prescription: Secondary | ICD-10-CM

## 2023-09-24 ENCOUNTER — Ambulatory Visit: Admitting: Nurse Practitioner

## 2023-09-24 ENCOUNTER — Encounter: Payer: Self-pay | Admitting: Nurse Practitioner

## 2023-09-24 VITALS — BP 136/88 | HR 94 | Temp 98.3°F | Resp 16 | Ht 71.0 in | Wt 256.4 lb

## 2023-09-24 DIAGNOSIS — I152 Hypertension secondary to endocrine disorders: Secondary | ICD-10-CM | POA: Diagnosis not present

## 2023-09-24 DIAGNOSIS — E1159 Type 2 diabetes mellitus with other circulatory complications: Secondary | ICD-10-CM | POA: Diagnosis not present

## 2023-09-24 DIAGNOSIS — J0110 Acute frontal sinusitis, unspecified: Secondary | ICD-10-CM | POA: Diagnosis not present

## 2023-09-24 MED ORDER — AMOXICILLIN-POT CLAVULANATE 875-125 MG PO TABS: 1.0000 | ORAL_TABLET | Freq: Two times a day (BID) | ORAL | 0 refills | Status: AC

## 2023-09-24 NOTE — Progress Notes (Signed)
 [facility] [address] [place], [place] [address]  Internal MEDICINE  Office Visit Note  Patient Name: Ronald Mata  [id number]  [id number]  Date of Service: 09/24/2023  Chief Complaint  Patient presents with   Acute Visit    Cough, congestion, ear ache last week. Covid test negative, daughter got viral and wife is also sick.  SOB     HPI Zaylon presents for an acute sick visit for symptoms of sinusitis.  --onset of sinus drainage was a couple of weeks ago but symptoms have been worsening in the past week. --reports cough, sinus pain/pressure, ear pain, headache, fatigue, mild body aches, SOB, chest tightness, runny nose and nasal congestion Wife and daughter are sick at home with URIs.  Covid test was negative.      Current Medication:  Outpatient Encounter Medications as of 09/24/2023  Medication Sig   albuterol (VENTOLIN HFA) 108 (90 Base) MCG/ACT inhaler INHALE 2 PUFFS INTO THE LUNGS EVERY 6 HOURS AS NEEDED FOR WHEEZING OR SHORTNESS OF BREATH   amLODipine (NORVASC) 5 MG tablet Take 1 tablet (5 mg total) by mouth daily.   amoxicillin-clavulanate (AUGMENTIN) 875-125 MG tablet Take 1 tablet by mouth 2 (two) times daily for 10 days. Take with food.   cetirizine (ZYRTEC) 10 MG tablet TAKE 1 TABLET(10 MG) BY MOUTH DAILY   co-enzyme Q-10 30 MG capsule Take 30 mg by mouth daily.   ELDERBERRY PO Take by mouth daily.   FARXIGA 10 MG TABS tablet TAKE 1 TABLET BY MOUTH DAILY BEFORE BREAKFAST   febuxostat (ULORIC) 40 MG tablet TAKE 1 TABLET(40 MG) BY MOUTH DAILY   fluticasone (FLONASE) 50 MCG/ACT nasal spray SHAKE LIQUID AND USE 2 SPRAYS IN EACH NOSTRIL DAILY   glucose blood (ONETOUCH VERIO) test strip Use 1 test strip to check glucose twice daily for diabetes E11.65   hydrochlorothiazide (HYDRODIURIL) 25 MG tablet TAKE 1 TABLET BY MOUTH EVERY DAY   ibuprofen (ADVIL) 800 MG tablet Take 1 tablet (800 mg total) by mouth every 8 (eight) hours as needed.   Lancets (ONETOUCH  DELICA PLUS LANCET33G) MISC Use 1 lancet to check glucose twice daily for diabetes E11.65   levothyroxine (SYNTHROID) 50 MCG tablet TAKE 1 TABLET(50 MCG) BY MOUTH DAILY BEFORE BREAKFAST   losartan (COZAAR) 100 MG tablet TAKE 1 TABLET(100 MG) BY MOUTH DAILY   methocarbamol (ROBAXIN) 500 MG tablet Take 1 tablet (500 mg total) by mouth every 8 (eight) hours as needed for muscle spasms.   montelukast (SINGULAIR) 10 MG tablet Take one tab po qd for sinus congestion   neomycin-bacitracin-polymyxin (NEOSPORIN) ophthalmic ointment Apply q-tip size to the left eye at night only for 7 nights   oseltamivir (TAMIFLU) 75 MG capsule Take 1 capsule (75 mg total) by mouth 2 (two) times daily.   rosuvastatin (CRESTOR) 5 MG tablet Take 1 tablet (5 mg total) by mouth daily.   Semaglutide (RYBELSUS) 7 MG TABS Take 1 tablet (7 mg total) by mouth daily.   sildenafil (VIAGRA) 100 MG tablet Take 0.5-1 tablets (50-100 mg total) by mouth daily as needed for erectile dysfunction.   Vitamin D, Ergocalciferol, (DRISDOL) 1.25 MG (50000 UNIT) CAPS capsule TAKE 1 CAPSULE BY MOUTH EVERY 7 DAYS   No facility-administered encounter medications on file as of 09/24/2023.      Medical History: Past Medical History:  Diagnosis Date   Arthritis    feet   Diabetes mellitus without complication (HCC)    Heart murmur    Hypertension  Thyroid disease      Vital Signs: BP 136/88   Pulse 94   Temp 98.3 F (36.8 C)   Resp 16   Ht 5\' 11"  (1.803 m)   Wt 256 lb 6.4 oz (116.3 kg)   SpO2 99%   BMI 35.76 kg/m    Review of Systems  Constitutional:  Positive for fatigue. Negative for appetite change, chills and fever.  HENT:  Positive for congestion, ear pain, postnasal drip, rhinorrhea, sinus pressure and sinus pain. Negative for sore throat.   Respiratory:  Positive for cough, chest tightness and shortness of breath. Negative for wheezing.   Cardiovascular: Negative.  Negative for chest pain and palpitations.   Gastrointestinal:  Negative for diarrhea, nausea and vomiting.  Musculoskeletal:  Positive for myalgias (slight body aches.).  Neurological:  Positive for headaches.    Physical Exam Vitals reviewed.  Constitutional:      General: He is not in acute distress.    Appearance: Normal appearance. He is obese. He is not ill-appearing.  HENT:     Head: Normocephalic and atraumatic.     Right Ear: Tympanic membrane, ear canal and external ear normal.     Left Ear: Tympanic membrane, ear canal and external ear normal.     Nose: Mucosal edema, congestion and rhinorrhea present.     Right Turbinates: Swollen and pale.     Left Turbinates: Swollen and pale.     Right Sinus: No maxillary sinus tenderness or frontal sinus tenderness.     Left Sinus: No maxillary sinus tenderness or frontal sinus tenderness.     Mouth/Throat:     Mouth: Mucous membranes are moist.     Pharynx: Posterior oropharyngeal erythema present.  Eyes:     Pupils: Pupils are equal, round, and reactive to light.  Cardiovascular:     Rate and Rhythm: Normal rate and regular rhythm.     Heart sounds: Normal heart sounds. No murmur heard. Pulmonary:     Effort: Pulmonary effort is normal. No respiratory distress.     Breath sounds: Normal breath sounds. No wheezing.  Neurological:     Mental Status: He is alert.       Assessment/Plan: 1. Acute non-recurrent frontal sinusitis (Primary) Take augmentin as prescribed until gone.  - amoxicillin-clavulanate (AUGMENTIN) 875-125 MG tablet; Take 1 tablet by mouth 2 (two) times daily for 10 days. Take with food.  Dispense: 20 tablet; Refill: 0  2. Type 2 diabetes mellitus with other circulatory complication, without long-term current use of insulin (HCC) Stable and controlled, continue rybelsus and farxiga as prescribed  3. Hypertension associated with diabetes (HCC) Stable, continue medications as prescribed.    General Counseling: [place] verbalizes understanding of the  findings of todays visit and agrees with plan of treatment. I have discussed any further diagnostic evaluation that may be needed or ordered today. We also reviewed his medications today. he has been encouraged to call the office with any questions or concerns that should arise related to todays visit.    Counseling:    No orders of the defined types were placed in this encounter.   Meds ordered this encounter  Medications   amoxicillin-clavulanate (AUGMENTIN) 875-125 MG tablet    Sig: Take 1 tablet by mouth 2 (two) times daily for 10 days. Take with food.    Dispense:  20 tablet    Refill:  0    Return if symptoms worsen or fail to improve, for keep upcoming visit on 3/26.  [place]  Controlled Substance Database was reviewed by me for overdose risk score (ORS)  Time spent:20 Minutes Time spent with patient included reviewing progress notes, labs, imaging studies, and discussing plan for follow up.   This patient was seen by Sallyanne Kuster, FNP-C in collaboration with Dr. Beverely Risen as a part of collaborative care agreement.  Kipp Shank R. Tedd Sias, MSN, FNP-C Internal Medicine

## 2023-10-02 ENCOUNTER — Encounter: Payer: Self-pay | Admitting: Nurse Practitioner

## 2023-10-17 ENCOUNTER — Ambulatory Visit (INDEPENDENT_AMBULATORY_CARE_PROVIDER_SITE_OTHER): Payer: BC Managed Care – PPO | Admitting: Nurse Practitioner

## 2023-10-17 ENCOUNTER — Encounter: Payer: Self-pay | Admitting: Nurse Practitioner

## 2023-10-17 VITALS — BP 130/70 | HR 76 | Temp 98.2°F | Resp 16 | Ht 71.0 in | Wt 259.2 lb

## 2023-10-17 DIAGNOSIS — E1159 Type 2 diabetes mellitus with other circulatory complications: Secondary | ICD-10-CM

## 2023-10-17 DIAGNOSIS — E119 Type 2 diabetes mellitus without complications: Secondary | ICD-10-CM | POA: Insufficient documentation

## 2023-10-17 DIAGNOSIS — Z79899 Other long term (current) drug therapy: Secondary | ICD-10-CM

## 2023-10-17 DIAGNOSIS — E1169 Type 2 diabetes mellitus with other specified complication: Secondary | ICD-10-CM | POA: Diagnosis not present

## 2023-10-17 DIAGNOSIS — J301 Allergic rhinitis due to pollen: Secondary | ICD-10-CM

## 2023-10-17 DIAGNOSIS — I152 Hypertension secondary to endocrine disorders: Secondary | ICD-10-CM

## 2023-10-17 DIAGNOSIS — E785 Hyperlipidemia, unspecified: Secondary | ICD-10-CM

## 2023-10-17 DIAGNOSIS — N522 Drug-induced erectile dysfunction: Secondary | ICD-10-CM | POA: Insufficient documentation

## 2023-10-17 LAB — POCT GLYCOSYLATED HEMOGLOBIN (HGB A1C): Hemoglobin A1C: 6.5 % — AB (ref 4.0–5.6)

## 2023-10-17 MED ORDER — MONTELUKAST SODIUM 10 MG PO TABS: ORAL_TABLET | ORAL | 3 refills | Status: AC

## 2023-10-17 MED ORDER — VITAMIN D (ERGOCALCIFEROL) 1.25 MG (50000 UNIT) PO CAPS: 50000.0000 [IU] | ORAL_CAPSULE | ORAL | 1 refills | Status: DC

## 2023-10-17 MED ORDER — RYBELSUS 7 MG PO TABS: 1.0000 | ORAL_TABLET | Freq: Every day | ORAL | 3 refills | Status: AC

## 2023-10-17 MED ORDER — LEVOTHYROXINE SODIUM 50 MCG PO TABS: 50.0000 ug | ORAL_TABLET | Freq: Every day | ORAL | 3 refills | Status: AC

## 2023-10-17 MED ORDER — DAPAGLIFLOZIN PROPANEDIOL 10 MG PO TABS: 10.0000 mg | ORAL_TABLET | Freq: Every day | ORAL | 3 refills | Status: AC

## 2023-10-17 MED ORDER — ROSUVASTATIN CALCIUM 5 MG PO TABS: 5.0000 mg | ORAL_TABLET | Freq: Every day | ORAL | 3 refills | Status: DC

## 2023-10-17 MED ORDER — CETIRIZINE HCL 10 MG PO TABS: ORAL_TABLET | ORAL | 3 refills | Status: AC

## 2023-10-17 MED ORDER — AMLODIPINE BESYLATE 5 MG PO TABS
5.0000 mg | ORAL_TABLET | Freq: Every day | ORAL | 3 refills | Status: DC
Start: 2023-10-17 — End: 2024-03-19

## 2023-10-17 MED ORDER — SILDENAFIL CITRATE 100 MG PO TABS: 50.0000 mg | ORAL_TABLET | Freq: Every day | ORAL | 0 refills | Status: DC | PRN

## 2023-10-17 NOTE — Progress Notes (Signed)
 [facility] [address] [place], [place] [address]  Internal MEDICINE  Office Visit Note  Patient Name: Ronald Mata  [id number]  [id number]  Date of Service: 10/17/2023  Chief Complaint  Patient presents with   Diabetes   Hypertension   Follow-up    HPI Ronald Mata presents for a follow-up visit for diabetes, hypertension high cholesterol, allergic rhinitis and medication refills.  Diabetes -- A1c slightly increased to 6.5 but still stable. has not been as active due to cold weather and URIs over the winter.  Allergic rhinitis -- worse now due to increased pollen count -- taking nasal spray and cetirizine, has prescription for montelukast but has not been taking it.  Hypertension -- BP initially elevated but improved when rechecked. Taking amlodipine, hydrochlorothiazide, and losartan.  High cholesterol -- taking rosuvastatin daily     Current Medication: Outpatient Encounter Medications as of 10/17/2023  Medication Sig   albuterol (VENTOLIN HFA) 108 (90 Base) MCG/ACT inhaler INHALE 2 PUFFS INTO THE LUNGS EVERY 6 HOURS AS NEEDED FOR WHEEZING OR SHORTNESS OF BREATH   co-enzyme Q-10 30 MG capsule Take 30 mg by mouth daily.   ELDERBERRY PO Take by mouth daily.   febuxostat (ULORIC) 40 MG tablet TAKE 1 TABLET(40 MG) BY MOUTH DAILY   fluticasone (FLONASE) 50 MCG/ACT nasal spray SHAKE LIQUID AND USE 2 SPRAYS IN EACH NOSTRIL DAILY   glucose blood (ONETOUCH VERIO) test strip Use 1 test strip to check glucose twice daily for diabetes E11.65   hydrochlorothiazide (HYDRODIURIL) 25 MG tablet TAKE 1 TABLET BY MOUTH EVERY DAY   ibuprofen (ADVIL) 800 MG tablet Take 1 tablet (800 mg total) by mouth every 8 (eight) hours as needed.   Lancets (ONETOUCH DELICA PLUS LANCET33G) MISC Use 1 lancet to check glucose twice daily for diabetes E11.65   losartan (COZAAR) 100 MG tablet TAKE 1 TABLET(100 MG) BY MOUTH DAILY   methocarbamol (ROBAXIN) 500 MG tablet Take 1 tablet (500 mg total) by mouth  every 8 (eight) hours as needed for muscle spasms.   [DISCONTINUED] amLODipine (NORVASC) 5 MG tablet Take 1 tablet (5 mg total) by mouth daily.   [DISCONTINUED] cetirizine (ZYRTEC) 10 MG tablet TAKE 1 TABLET(10 MG) BY MOUTH DAILY   [DISCONTINUED] FARXIGA 10 MG TABS tablet TAKE 1 TABLET BY MOUTH DAILY BEFORE BREAKFAST   [DISCONTINUED] levothyroxine (SYNTHROID) 50 MCG tablet TAKE 1 TABLET(50 MCG) BY MOUTH DAILY BEFORE BREAKFAST   [DISCONTINUED] montelukast (SINGULAIR) 10 MG tablet Take one tab po qd for sinus congestion   [DISCONTINUED] neomycin-bacitracin-polymyxin (NEOSPORIN) ophthalmic ointment Apply q-tip size to the left eye at night only for 7 nights   [DISCONTINUED] oseltamivir (TAMIFLU) 75 MG capsule Take 1 capsule (75 mg total) by mouth 2 (two) times daily.   [DISCONTINUED] rosuvastatin (CRESTOR) 5 MG tablet Take 1 tablet (5 mg total) by mouth daily.   [DISCONTINUED] Semaglutide (RYBELSUS) 7 MG TABS Take 1 tablet (7 mg total) by mouth daily.   [DISCONTINUED] sildenafil (VIAGRA) 100 MG tablet Take 0.5-1 tablets (50-100 mg total) by mouth daily as needed for erectile dysfunction.   [DISCONTINUED] Vitamin D, Ergocalciferol, (DRISDOL) 1.25 MG (50000 UNIT) CAPS capsule TAKE 1 CAPSULE BY MOUTH EVERY 7 DAYS   amLODipine (NORVASC) 5 MG tablet Take 1 tablet (5 mg total) by mouth daily.   cetirizine (ZYRTEC) 10 MG tablet TAKE 1 TABLET(10 MG) BY MOUTH DAILY   dapagliflozin propanediol (FARXIGA) 10 MG TABS tablet Take 1 tablet (10 mg total) by mouth daily before breakfast.  levothyroxine (SYNTHROID) 50 MCG tablet Take 1 tablet (50 mcg total) by mouth daily before breakfast.   montelukast (SINGULAIR) 10 MG tablet Take one tab po qd for sinus congestion   rosuvastatin (CRESTOR) 5 MG tablet Take 1 tablet (5 mg total) by mouth daily.   Semaglutide (RYBELSUS) 7 MG TABS Take 1 tablet (7 mg total) by mouth daily.   sildenafil (VIAGRA) 100 MG tablet Take 0.5-1 tablets (50-100 mg total) by mouth daily as  needed for erectile dysfunction.   Vitamin D, Ergocalciferol, (DRISDOL) 1.25 MG (50000 UNIT) CAPS capsule Take 1 capsule (50,000 Units total) by mouth every 7 (seven) days.   No facility-administered encounter medications on file as of 10/17/2023.    Surgical History: Past Surgical History:  Procedure Laterality Date   CARDIAC CATHETERIZATION     15 years ago no stents   COLONOSCOPY WITH PROPOFOL N/A 10/15/2020   Procedure: COLONOSCOPY WITH PROPOFOL;  Surgeon: Midge Minium, MD;  Location: [facility];  Service: Endoscopy;  Laterality: N/A;  Diabetic   POLYPECTOMY  10/15/2020   Procedure: POLYPECTOMY;  Surgeon: Midge Minium, MD;  Location[facility];  Service: Endoscopy;;   stomach wrap and achalasia      Medical History: Past Medical History:  Diagnosis Date   Arthritis    feet   Diabetes mellitus without complication (HCC)    Heart murmur    Hypertension    Thyroid disease     Family History: Family History  Problem Relation Age of Onset   Cancer Mother    Diabetes Mother    Hypertension Mother    Hyperlipidemia Mother    Stroke Mother    Osteoarthritis Mother    Diabetes Father    Osteoarthritis Father    Glaucoma Father     Social History   Socioeconomic History   Marital status: Married    Spouse name: Not on file   Number of children: Not on file   Years of education: Not on file   Highest education level: Not on file  Occupational History   Not on file  Tobacco Use   Smoking status: Never   Smokeless tobacco: Never  Vaping Use   Vaping status: Never Used  Substance and Sexual Activity   Alcohol use: No    Alcohol/week: 0.0 standard drinks of alcohol   Drug use: No   Sexual activity: Not on file  Other Topics Concern   Not on file  Social History Narrative   Not on file   Social Drivers of Health   Financial Resource Strain: Not on file  Food Insecurity: Not on file  Transportation Needs: Not on file  Physical Activity: Not  on file  Stress: Not on file  Social Connections: Not on file  Intimate Partner Violence: Not on file      Review of Systems  Constitutional:  Positive for fatigue and unexpected weight change.  HENT:  Positive for congestion, postnasal drip, rhinorrhea and sneezing.   Eyes:  Positive for itching.  Respiratory: Negative.  Negative for cough, chest tightness, shortness of breath and wheezing.   Cardiovascular: Negative.  Negative for chest pain and palpitations.    Vital Signs: BP 130/70 Comment: 146/98  Pulse 76   Temp 98.2 F (36.8 C)   Resp 16   Ht 5\' 11"  (1.803 m)   Wt 259 lb 3.2 oz (117.6 kg)   SpO2 98%   BMI 36.15 kg/m    Physical Exam Vitals reviewed.  Constitutional:  General: He is not in acute distress.    Appearance: Normal appearance. He is obese. He is not ill-appearing.  HENT:     Head: Normocephalic and atraumatic.     Nose: Congestion and rhinorrhea present.  Eyes:     Pupils: Pupils are equal, round, and reactive to light.  Cardiovascular:     Rate and Rhythm: Normal rate and regular rhythm.  Pulmonary:     Effort: Pulmonary effort is normal. No respiratory distress.  Neurological:     Mental Status: He is alert and oriented to person, place, and time.  Psychiatric:        Mood and Affect: Mood normal.        Behavior: Behavior normal.        Assessment/Plan: 1. Type 2 diabetes mellitus with other circulatory complication, without long-term current use of insulin (HCC) (Primary) A1c slightly elevated but still stable, continue farxiga and rybelsus as prescribed.  - POCT glycosylated hemoglobin (Hb A1C) - dapagliflozin propanediol (FARXIGA) 10 MG TABS tablet; Take 1 tablet (10 mg total) by mouth daily before breakfast.  Dispense: 90 tablet; Refill: 3 - Semaglutide (RYBELSUS) 7 MG TABS; Take 1 tablet (7 mg total) by mouth daily.  Dispense: 90 tablet; Refill: 3  2. Hypertension associated with diabetes (HCC) Continue amlodipine as  prescribed.  - amLODipine (NORVASC) 5 MG tablet; Take 1 tablet (5 mg total) by mouth daily.  Dispense: 90 tablet; Refill: 3  3. Hyperlipidemia associated with type 2 diabetes mellitus (HCC) Continue rosuvastatin as prescribed.  - rosuvastatin (CRESTOR) 5 MG tablet; Take 1 tablet (5 mg total) by mouth daily.  Dispense: 90 tablet; Refill: 3  4. Non-seasonal allergic rhinitis due to pollen Continue cetirizine as prescribed. Restart montelukast as prescribed.  - cetirizine (ZYRTEC) 10 MG tablet; TAKE 1 TABLET(10 MG) BY MOUTH DAILY  Dispense: 90 tablet; Refill: 3 - montelukast (SINGULAIR) 10 MG tablet; Take one tab po qd for sinus congestion  Dispense: 90 tablet; Refill: 3  5. Drug-induced erectile dysfunction Continue sildenafil as prescribed.  - sildenafil (VIAGRA) 100 MG tablet; Take 0.5-1 tablets (50-100 mg total) by mouth daily as needed for erectile dysfunction.  Dispense: 10 tablet; Refill: 0  6. Encounter for medication review Medication list reviewed, updated and refills ordered  - levothyroxine (SYNTHROID) 50 MCG tablet; Take 1 tablet (50 mcg total) by mouth daily before breakfast.  Dispense: 90 tablet; Refill: 3 - Vitamin D, Ergocalciferol, (DRISDOL) 1.25 MG (50000 UNIT) CAPS capsule; Take 1 capsule (50,000 Units total) by mouth every 7 (seven) days.  Dispense: 12 capsule; Refill: 1   General Counseling: Ronald Mata verbalizes understanding of the findings of todays visit and agrees with plan of treatment. I have discussed any further diagnostic evaluation that may be needed or ordered today. We also reviewed his medications today. he has been encouraged to call the office with any questions or concerns that should arise related to todays visit.    Orders Placed This Encounter  Procedures   POCT glycosylated hemoglobin (Hb A1C)    Meds ordered this encounter  Medications   amLODipine (NORVASC) 5 MG tablet    Sig: Take 1 tablet (5 mg total) by mouth daily.    Dispense:  90 tablet     Refill:  3    For future refills   cetirizine (ZYRTEC) 10 MG tablet    Sig: TAKE 1 TABLET(10 MG) BY MOUTH DAILY    Dispense:  90 tablet    Refill:  3  For future refills   dapagliflozin propanediol (FARXIGA) 10 MG TABS tablet    Sig: Take 1 tablet (10 mg total) by mouth daily before breakfast.    Dispense:  90 tablet    Refill:  3   levothyroxine (SYNTHROID) 50 MCG tablet    Sig: Take 1 tablet (50 mcg total) by mouth daily before breakfast.    Dispense:  90 tablet    Refill:  3    For future refills   montelukast (SINGULAIR) 10 MG tablet    Sig: Take one tab po qd for sinus congestion    Dispense:  90 tablet    Refill:  3    For future refill   rosuvastatin (CRESTOR) 5 MG tablet    Sig: Take 1 tablet (5 mg total) by mouth daily.    Dispense:  90 tablet    Refill:  3    For future refills   Semaglutide (RYBELSUS) 7 MG TABS    Sig: Take 1 tablet (7 mg total) by mouth daily.    Dispense:  90 tablet    Refill:  3    For future refills   sildenafil (VIAGRA) 100 MG tablet    Sig: Take 0.5-1 tablets (50-100 mg total) by mouth daily as needed for erectile dysfunction.    Dispense:  10 tablet    Refill:  0   Vitamin D, Ergocalciferol, (DRISDOL) 1.25 MG (50000 UNIT) CAPS capsule    Sig: Take 1 capsule (50,000 Units total) by mouth every 7 (seven) days.    Dispense:  12 capsule    Refill:  1    Return in about 4 months (around 02/18/2024) for F/U, Recheck A1C, Ronald Mata PCP.   Total time spent:30 Minutes Time spent includes review of chart, medications, test results, and follow up plan with the patient.   [place] Controlled Substance Database was reviewed by me.  This patient was seen by Sallyanne Kuster, FNP-C in collaboration with Dr. Beverely Risen as a part of collaborative care agreement.   Timoth Schara R. Tedd Sias, MSN, FNP-C Internal medicine

## 2024-02-19 ENCOUNTER — Encounter: Payer: Self-pay | Admitting: Nurse Practitioner

## 2024-02-19 ENCOUNTER — Ambulatory Visit (INDEPENDENT_AMBULATORY_CARE_PROVIDER_SITE_OTHER): Admitting: Nurse Practitioner

## 2024-02-19 VITALS — BP 135/68 | HR 79 | Temp 97.6°F | Resp 16 | Ht 71.0 in | Wt 255.2 lb

## 2024-02-19 DIAGNOSIS — E039 Hypothyroidism, unspecified: Secondary | ICD-10-CM

## 2024-02-19 DIAGNOSIS — Z125 Encounter for screening for malignant neoplasm of prostate: Secondary | ICD-10-CM

## 2024-02-19 DIAGNOSIS — E559 Vitamin D deficiency, unspecified: Secondary | ICD-10-CM | POA: Diagnosis not present

## 2024-02-19 DIAGNOSIS — Z23 Encounter for immunization: Secondary | ICD-10-CM

## 2024-02-19 DIAGNOSIS — E1169 Type 2 diabetes mellitus with other specified complication: Secondary | ICD-10-CM

## 2024-02-19 DIAGNOSIS — E785 Hyperlipidemia, unspecified: Secondary | ICD-10-CM

## 2024-02-19 DIAGNOSIS — I152 Hypertension secondary to endocrine disorders: Secondary | ICD-10-CM

## 2024-02-19 DIAGNOSIS — E1159 Type 2 diabetes mellitus with other circulatory complications: Secondary | ICD-10-CM | POA: Diagnosis not present

## 2024-02-19 LAB — POCT GLYCOSYLATED HEMOGLOBIN (HGB A1C): Hemoglobin A1C: 6.5 % — AB (ref 4.0–5.6)

## 2024-02-19 MED ORDER — PNEUMOCOCCAL 20-VAL CONJ VACC 0.5 ML IM SUSY: 0.5000 mL | PREFILLED_SYRINGE | Freq: Once | INTRAMUSCULAR | 0 refills | Status: DC | PRN

## 2024-02-19 NOTE — Progress Notes (Signed)
 [facility] [address] [place], [place] [address]  Internal MEDICINE  Office Visit Note  Patient Name: Ronald Mata  [id number]  [id number]  Date of Service: 02/19/2024  Chief Complaint  Patient presents with   Diabetes   Hypertension   Follow-up     Ronald Mata presents for a follow-up visit for diabetes, hypertension, high cholesterol, hypothyroidism, refills and lab orders.  Diabetes --A1c is stable at 6.5. taking rybelsus  and farxiga .  Hypertension -- elevated BP initially, improved when rechecked. Currently taking amlodipine , hydrochlorothiazide , and losartan  High cholesterol -- taking rosuvastatin  daily.  Hypothyroidism -- taking levothyroxine  daily, thyroid labs have been stable for a long time.  Due for routine labs for upcoming annual CPE.      Current Medication: Outpatient Encounter Medications as of 02/19/2024  Medication Sig   pneumococcal 20-valent conjugate vaccine (PREVNAR 20) 0.5 ML injection Inject 0.5 mLs into the muscle once as needed for up to 1 dose for immunization.   albuterol  (VENTOLIN  HFA) 108 (90 Base) MCG/ACT inhaler INHALE 2 PUFFS INTO THE LUNGS EVERY 6 HOURS AS NEEDED FOR WHEEZING OR SHORTNESS OF BREATH   amLODipine  (NORVASC ) 5 MG tablet Take 1 tablet (5 mg total) by mouth daily.   cetirizine  (ZYRTEC ) 10 MG tablet TAKE 1 TABLET(10 MG) BY MOUTH DAILY   co-enzyme Q-10 30 MG capsule Take 30 mg by mouth daily.   dapagliflozin  propanediol (FARXIGA ) 10 MG TABS tablet Take 1 tablet (10 mg total) by mouth daily before breakfast.   ELDERBERRY PO Take by mouth daily.   febuxostat  (ULORIC ) 40 MG tablet TAKE 1 TABLET(40 MG) BY MOUTH DAILY   fluticasone  (FLONASE ) 50 MCG/ACT nasal spray SHAKE LIQUID AND USE 2 SPRAYS IN EACH NOSTRIL DAILY   glucose blood (ONETOUCH VERIO) test strip Use 1 test strip to check glucose twice daily for diabetes E11.65   hydrochlorothiazide  (HYDRODIURIL ) 25 MG tablet TAKE 1 TABLET BY MOUTH EVERY DAY   ibuprofen  (ADVIL ) 800 MG  tablet Take 1 tablet (800 mg total) by mouth every 8 (eight) hours as needed.   Lancets (ONETOUCH DELICA PLUS LANCET33G) MISC Use 1 lancet to check glucose twice daily for diabetes E11.65   levothyroxine  (SYNTHROID ) 50 MCG tablet Take 1 tablet (50 mcg total) by mouth daily before breakfast.   losartan  (COZAAR ) 100 MG tablet TAKE 1 TABLET(100 MG) BY MOUTH DAILY   methocarbamol  (ROBAXIN ) 500 MG tablet Take 1 tablet (500 mg total) by mouth every 8 (eight) hours as needed for muscle spasms.   montelukast  (SINGULAIR ) 10 MG tablet Take one tab po qd for sinus congestion   rosuvastatin  (CRESTOR ) 5 MG tablet Take 1 tablet (5 mg total) by mouth daily.   Semaglutide  (RYBELSUS ) 7 MG TABS Take 1 tablet (7 mg total) by mouth daily.   sildenafil  (VIAGRA ) 100 MG tablet Take 0.5-1 tablets (50-100 mg total) by mouth daily as needed for erectile dysfunction.   Vitamin D , Ergocalciferol , (DRISDOL ) 1.25 MG (50000 UNIT) CAPS capsule Take 1 capsule (50,000 Units total) by mouth every 7 (seven) days.   No facility-administered encounter medications on file as of 02/19/2024.    Surgical History: Past Surgical History:  Procedure Laterality Date   CARDIAC CATHETERIZATION     15 years ago no stents   COLONOSCOPY WITH PROPOFOL  N/A 10/15/2020   Procedure: COLONOSCOPY WITH PROPOFOL ;  Surgeon: Jinny Carmine, MD;  Location: [facility];  Service: Endoscopy;  Laterality: N/A;  Diabetic   POLYPECTOMY  10/15/2020   Procedure: POLYPECTOMY;  Surgeon: Jinny Carmine,  MD;  Location: [facility];  Service: Endoscopy;;   stomach wrap and achalasia      Medical History: Past Medical History:  Diagnosis Date   Arthritis    feet   Diabetes mellitus without complication (HCC)    Heart murmur    Hypertension    Thyroid disease     Family History: Family History  Problem Relation Age of Onset   Cancer Mother    Diabetes Mother    Hypertension Mother    Hyperlipidemia Mother    Stroke Mother     Osteoarthritis Mother    Diabetes Father    Osteoarthritis Father    Glaucoma Father     Social History   Socioeconomic History   Marital status: Married    Spouse name: Not on file   Number of children: Not on file   Years of education: Not on file   Highest education level: Not on file  Occupational History   Not on file  Tobacco Use   Smoking status: Never   Smokeless tobacco: Never  Vaping Use   Vaping status: Never Used  Substance and Sexual Activity   Alcohol use: No    Alcohol/week: 0.0 standard drinks of alcohol   Drug use: No   Sexual activity: Not on file  Other Topics Concern   Not on file  Social History Narrative   Not on file   Social Drivers of Health   Financial Resource Strain: Not on file  Food Insecurity: Not on file  Transportation Needs: Not on file  Physical Activity: Not on file  Stress: Not on file  Social Connections: Not on file  Intimate Partner Violence: Not on file      Review of Systems  Constitutional:  Positive for fatigue and unexpected weight change.  HENT:  Negative for congestion, postnasal drip, rhinorrhea and sneezing.   Eyes:  Negative for itching.  Respiratory: Negative.  Negative for cough, chest tightness, shortness of breath and wheezing.   Cardiovascular: Negative.  Negative for chest pain and palpitations.    Vital Signs: BP 135/68 Comment: 138/98  Pulse 79   Temp 97.6 F (36.4 C)   Resp 16   Ht 5' 11 (1.803 m)   Wt 255 lb 3.2 oz (115.8 kg)   SpO2 99%   BMI 35.59 kg/m    Physical Exam Vitals reviewed.  Constitutional:      General: He is not in acute distress.    Appearance: Normal appearance. He is obese. He is not ill-appearing.  HENT:     Head: Normocephalic and atraumatic.  Eyes:     Pupils: Pupils are equal, round, and reactive to light.  Cardiovascular:     Rate and Rhythm: Normal rate and regular rhythm.  Pulmonary:     Effort: Pulmonary effort is normal. No respiratory distress.   Neurological:     Mental Status: He is alert and oriented to person, place, and time.  Psychiatric:        Mood and Affect: Mood normal.        Behavior: Behavior normal.        Assessment/Plan: 1. Type 2 diabetes mellitus with other circulatory complication, without long-term current use of insulin (HCC) (Primary) A1c is stable. Routine labs ordered. Continue farxiga  and rybelsus  as prescribed.  - POCT glycosylated hemoglobin (Hb A1C) - CBC with Differential/Platelet - CMP14+EGFR - Lipid Profile - TSH + free T4  2. Hypertension associated with diabetes (HCC) Stable, continue amlodipine , hydrochlorothiazide , and  losartan  as prescribed. Routine labs ordered  - CBC with Differential/Platelet - CMP14+EGFR - Lipid Profile - TSH + free T4  3. Hyperlipidemia associated with type 2 diabetes mellitus (HCC) Continue rosuvastatin  as prescribed. Routine labs ordered  - CBC with Differential/Platelet - CMP14+EGFR - Lipid Profile - TSH + free T4  4. Acquired hypothyroidism Routine labs ordered. Continue levothyroxine  as prescribed.  - CBC with Differential/Platelet - CMP14+EGFR - Lipid Profile - TSH + free T4  5. Vitamin D  deficiency Routine lab ordered  - Vitamin D  (25 hydroxy)  6. Screening for prostate cancer PSA level ordered.  - PSA Total (Reflex To Free)  7. Need for vaccination - pneumococcal 20-valent conjugate vaccine (PREVNAR 20) 0.5 ML injection; Inject 0.5 mLs into the muscle once as needed for up to 1 dose for immunization.  Dispense: 0.5 mL; Refill: 0   General Counseling: Warnie verbalizes understanding of the findings of todays visit and agrees with plan of treatment. I have discussed any further diagnostic evaluation that may be needed or ordered today. We also reviewed his medications today. he has been encouraged to call the office with any questions or concerns that should arise related to todays visit.    Orders Placed This Encounter  Procedures    CBC with Differential/Platelet   CMP14+EGFR   Lipid Profile   TSH + free T4   Vitamin D  (25 hydroxy)   PSA Total (Reflex To Free)   POCT glycosylated hemoglobin (Hb A1C)    Meds ordered this encounter  Medications   pneumococcal 20-valent conjugate vaccine (PREVNAR 20) 0.5 ML injection    Sig: Inject 0.5 mLs into the muscle once as needed for up to 1 dose for immunization.    Dispense:  0.5 mL    Refill:  0    Please vaccinate patient with PCV 20.    Return for previously scheduled, CPE, Ardyth Kelso PCP in september, have labs done before visit. .   Total time spent:30 Minutes Time spent includes review of chart, medications, test results, and follow up plan with the patient.   [place] Controlled Substance Database was reviewed by me.  This patient was seen by Mardy Maxin, FNP-C in collaboration with Dr. Sigrid Bathe as a part of collaborative care agreement.   Ambermarie Honeyman R. Maxin, MSN, FNP-C Internal medicine

## 2024-02-20 ENCOUNTER — Encounter: Payer: Self-pay | Admitting: Nurse Practitioner

## 2024-03-02 ENCOUNTER — Other Ambulatory Visit: Payer: Self-pay | Admitting: Nurse Practitioner

## 2024-03-02 DIAGNOSIS — Z79899 Other long term (current) drug therapy: Secondary | ICD-10-CM

## 2024-03-19 ENCOUNTER — Telehealth: Payer: Self-pay | Admitting: Nurse Practitioner

## 2024-03-19 ENCOUNTER — Other Ambulatory Visit: Payer: Self-pay | Admitting: Nurse Practitioner

## 2024-03-19 DIAGNOSIS — E1159 Type 2 diabetes mellitus with other circulatory complications: Secondary | ICD-10-CM

## 2024-03-19 NOTE — Telephone Encounter (Signed)
 Left vm and sent mychart message to confirm 03/25/24 appointment-Toni

## 2024-03-25 ENCOUNTER — Encounter: Payer: BC Managed Care – PPO | Admitting: Nurse Practitioner

## 2024-04-07 ENCOUNTER — Encounter: Admitting: Nurse Practitioner

## 2024-04-14 ENCOUNTER — Encounter: Payer: Self-pay | Admitting: Nurse Practitioner

## 2024-04-14 ENCOUNTER — Ambulatory Visit (INDEPENDENT_AMBULATORY_CARE_PROVIDER_SITE_OTHER): Admitting: Nurse Practitioner

## 2024-04-14 VITALS — BP 134/88 | HR 65 | Temp 98.2°F | Resp 16 | Ht 71.0 in | Wt 256.4 lb

## 2024-04-14 DIAGNOSIS — J0100 Acute maxillary sinusitis, unspecified: Secondary | ICD-10-CM | POA: Diagnosis not present

## 2024-04-14 DIAGNOSIS — R062 Wheezing: Secondary | ICD-10-CM | POA: Diagnosis not present

## 2024-04-14 DIAGNOSIS — R051 Acute cough: Secondary | ICD-10-CM

## 2024-04-14 MED ORDER — HYDROCOD POLI-CHLORPHE POLI ER 10-8 MG/5ML PO SUER: 5.0000 mL | Freq: Two times a day (BID) | ORAL | 0 refills | Status: DC | PRN

## 2024-04-14 MED ORDER — PREDNISONE 10 MG (21) PO TBPK: ORAL_TABLET | ORAL | 0 refills | Status: DC

## 2024-04-14 MED ORDER — AMOXICILLIN-POT CLAVULANATE 875-125 MG PO TABS: 1.0000 | ORAL_TABLET | Freq: Two times a day (BID) | ORAL | 0 refills | Status: AC

## 2024-04-14 NOTE — Progress Notes (Signed)
 [facility] [address] [place], [place] [address]  Internal MEDICINE  Office Visit Note  Patient Name: Ronald Mata  [id number]  [id number]  Date of Service: 04/14/2024  Chief Complaint  Patient presents with   Acute Visit    Sinus drainage, cough, dizzy (ear infection?) covid test negative     HPI Whitley presents for an acute sick visit for symptoms of sinusitis --onset of symptoms was last Wednesday --reports sinus drainage, cough, dizziness, ear pain, fatigue, decreased appetite, nasal congestion, sinus pressure/pain, sneezing, SOB, wheezing and chest tightness, headache, and body aches.  --Negative for covid --Santina out of town and has been around other people who were sick as well.       Current Medication:  Outpatient Encounter Medications as of 04/14/2024  Medication Sig   amoxicillin -clavulanate (AUGMENTIN ) 875-125 MG tablet Take 1 tablet by mouth 2 (two) times daily for 10 days. Take with food   chlorpheniramine-HYDROcodone  (TUSSIONEX) 10-8 MG/5ML Take 5 mLs by mouth every 12 (twelve) hours as needed for cough.   predniSONE  (STERAPRED UNI-PAK 21 TAB) 10 MG (21) TBPK tablet Use as directed for 6 days   albuterol  (VENTOLIN  HFA) 108 (90 Base) MCG/ACT inhaler INHALE 2 PUFFS INTO THE LUNGS EVERY 6 HOURS AS NEEDED FOR WHEEZING OR SHORTNESS OF BREATH   amLODipine  (NORVASC ) 5 MG tablet TAKE 1 TABLET(5 MG) BY MOUTH DAILY   cetirizine  (ZYRTEC ) 10 MG tablet TAKE 1 TABLET(10 MG) BY MOUTH DAILY   co-enzyme Q-10 30 MG capsule Take 30 mg by mouth daily.   dapagliflozin  propanediol (FARXIGA ) 10 MG TABS tablet Take 1 tablet (10 mg total) by mouth daily before breakfast.   ELDERBERRY PO Take by mouth daily.   febuxostat  (ULORIC ) 40 MG tablet TAKE 1 TABLET(40 MG) BY MOUTH DAILY   fluticasone  (FLONASE ) 50 MCG/ACT nasal spray SHAKE LIQUID AND USE 2 SPRAYS IN EACH NOSTRIL DAILY   glucose blood (ONETOUCH VERIO) test strip Use 1 test strip to check glucose twice daily for  diabetes E11.65   hydrochlorothiazide  (HYDRODIURIL ) 25 MG tablet TAKE 1 TABLET BY MOUTH EVERY DAY   ibuprofen  (ADVIL ) 800 MG tablet Take 1 tablet (800 mg total) by mouth every 8 (eight) hours as needed.   Lancets (ONETOUCH DELICA PLUS LANCET33G) MISC Use 1 lancet to check glucose twice daily for diabetes E11.65   levothyroxine  (SYNTHROID ) 50 MCG tablet Take 1 tablet (50 mcg total) by mouth daily before breakfast.   losartan  (COZAAR ) 100 MG tablet TAKE 1 TABLET(100 MG) BY MOUTH DAILY   methocarbamol  (ROBAXIN ) 500 MG tablet Take 1 tablet (500 mg total) by mouth every 8 (eight) hours as needed for muscle spasms.   montelukast  (SINGULAIR ) 10 MG tablet Take one tab po qd for sinus congestion   pneumococcal 20-valent conjugate vaccine (PREVNAR 20) 0.5 ML injection Inject 0.5 mLs into the muscle once as needed for up to 1 dose for immunization.   rosuvastatin  (CRESTOR ) 5 MG tablet Take 1 tablet (5 mg total) by mouth daily.   Semaglutide  (RYBELSUS ) 7 MG TABS Take 1 tablet (7 mg total) by mouth daily.   sildenafil  (VIAGRA ) 100 MG tablet Take 0.5-1 tablets (50-100 mg total) by mouth daily as needed for erectile dysfunction.   Vitamin D , Ergocalciferol , (DRISDOL ) 1.25 MG (50000 UNIT) CAPS capsule Take 1 capsule (50,000 Units total) by mouth every 7 (seven) days.   No facility-administered encounter medications on file as of 04/14/2024.      Medical History: Past Medical History:  Diagnosis Date  Arthritis    feet   Diabetes mellitus without complication (HCC)    Heart murmur    Hypertension    Thyroid disease      Vital Signs: BP 134/88   Pulse 65   Temp 98.2 F (36.8 C)   Resp 16   Ht 5' 11 (1.803 m)   Wt 256 lb 6.4 oz (116.3 kg)   SpO2 97%   BMI 35.76 kg/m    Review of Systems  Constitutional:  Positive for appetite change and fatigue. Negative for chills and fever.  HENT:  Positive for congestion, ear pain, postnasal drip, sinus pressure, sinus pain and sneezing. Negative for  rhinorrhea, sore throat and trouble swallowing.   Respiratory:  Positive for cough, chest tightness, shortness of breath and wheezing.   Cardiovascular: Negative.  Negative for chest pain and palpitations.  Gastrointestinal: Negative.  Negative for diarrhea, nausea and vomiting.  Musculoskeletal:  Positive for myalgias (body aches).  Neurological:  Positive for headaches.  Psychiatric/Behavioral:  Positive for sleep disturbance.     Physical Exam Vitals reviewed.  Constitutional:      General: He is not in acute distress.    Appearance: Normal appearance. He is obese. He is ill-appearing.  HENT:     Head: Normocephalic and atraumatic.     Right Ear: Tympanic membrane, ear canal and external ear normal. Decreased hearing noted.     Left Ear: External ear normal. Decreased hearing noted. Swelling and tenderness present. Tympanic membrane is erythematous.     Nose: Mucosal edema, congestion and rhinorrhea present.     Right Turbinates: Swollen.     Left Turbinates: Swollen.     Right Sinus: No maxillary sinus tenderness or frontal sinus tenderness.     Left Sinus: No maxillary sinus tenderness or frontal sinus tenderness.     Mouth/Throat:     Mouth: Mucous membranes are moist.     Pharynx: Posterior oropharyngeal erythema present.  Eyes:     Pupils: Pupils are equal, round, and reactive to light.  Cardiovascular:     Rate and Rhythm: Normal rate and regular rhythm.     Heart sounds: Normal heart sounds. No murmur heard. Pulmonary:     Effort: Pulmonary effort is normal. No respiratory distress.     Breath sounds: Normal breath sounds. No wheezing.  Lymphadenopathy:     Cervical: No cervical adenopathy.  Skin:    Capillary Refill: Capillary refill takes less than 2 seconds.  Neurological:     Mental Status: He is alert and oriented to person, place, and time.  Psychiatric:        Mood and Affect: Mood normal.        Behavior: Behavior normal.       Assessment/Plan: 1.  Acute non-recurrent maxillary sinusitis (Primary) Take augmentin  and prednisone  as prescribed, until gone. Take cough syrup as needed  - amoxicillin -clavulanate (AUGMENTIN ) 875-125 MG tablet; Take 1 tablet by mouth 2 (two) times daily for 10 days. Take with food  Dispense: 20 tablet; Refill: 0 - predniSONE  (STERAPRED UNI-PAK 21 TAB) 10 MG (21) TBPK tablet; Use as directed for 6 days  Dispense: 21 tablet; Refill: 0 - chlorpheniramine-HYDROcodone  (TUSSIONEX) 10-8 MG/5ML; Take 5 mLs by mouth every 12 (twelve) hours as needed for cough.  Dispense: 140 mL; Refill: 0  2. Acute cough Take augmentin  and prednisone  as prescribed, until gone. Take cough syrup as needed  - amoxicillin -clavulanate (AUGMENTIN ) 875-125 MG tablet; Take 1 tablet by mouth 2 (two) times daily for  10 days. Take with food  Dispense: 20 tablet; Refill: 0 - predniSONE  (STERAPRED UNI-PAK 21 TAB) 10 MG (21) TBPK tablet; Use as directed for 6 days  Dispense: 21 tablet; Refill: 0 - chlorpheniramine-HYDROcodone  (TUSSIONEX) 10-8 MG/5ML; Take 5 mLs by mouth every 12 (twelve) hours as needed for cough.  Dispense: 140 mL; Refill: 0  3. Wheezing Prednisone  prescribed, take until gone.  - predniSONE  (STERAPRED UNI-PAK 21 TAB) 10 MG (21) TBPK tablet; Use as directed for 6 days  Dispense: 21 tablet; Refill: 0   General Counseling: Graham verbalizes understanding of the findings of todays visit and agrees with plan of treatment. I have discussed any further diagnostic evaluation that may be needed or ordered today. We also reviewed his medications today. he has been encouraged to call the office with any questions or concerns that should arise related to todays visit.    Counseling:    No orders of the defined types were placed in this encounter.   Meds ordered this encounter  Medications   amoxicillin -clavulanate (AUGMENTIN ) 875-125 MG tablet    Sig: Take 1 tablet by mouth 2 (two) times daily for 10 days. Take with food    Dispense:  20  tablet    Refill:  0    Fill new script today asap.   predniSONE  (STERAPRED UNI-PAK 21 TAB) 10 MG (21) TBPK tablet    Sig: Use as directed for 6 days    Dispense:  21 tablet    Refill:  0   chlorpheniramine-HYDROcodone  (TUSSIONEX) 10-8 MG/5ML    Sig: Take 5 mLs by mouth every 12 (twelve) hours as needed for cough.    Dispense:  140 mL    Refill:  0    Fill new script today asap    Return if symptoms worsen or fail to improve.  [place] Controlled Substance Database was reviewed by me for overdose risk score (ORS)  Time spent:30 Minutes Time spent with patient included reviewing progress notes, labs, imaging studies, and discussing plan for follow up.   This patient was seen by Mardy Maxin, FNP-C in collaboration with Dr. Sigrid Bathe as a part of collaborative care agreement.  Charlese Gruetzmacher R. Maxin, MSN, FNP-C Internal Medicine

## 2024-04-21 ENCOUNTER — Encounter: Admitting: Nurse Practitioner

## 2024-04-30 DIAGNOSIS — E1159 Type 2 diabetes mellitus with other circulatory complications: Secondary | ICD-10-CM | POA: Diagnosis not present

## 2024-04-30 DIAGNOSIS — Z125 Encounter for screening for malignant neoplasm of prostate: Secondary | ICD-10-CM | POA: Diagnosis not present

## 2024-04-30 DIAGNOSIS — E785 Hyperlipidemia, unspecified: Secondary | ICD-10-CM | POA: Diagnosis not present

## 2024-04-30 DIAGNOSIS — E1169 Type 2 diabetes mellitus with other specified complication: Secondary | ICD-10-CM | POA: Diagnosis not present

## 2024-04-30 DIAGNOSIS — E559 Vitamin D deficiency, unspecified: Secondary | ICD-10-CM | POA: Diagnosis not present

## 2024-04-30 DIAGNOSIS — I152 Hypertension secondary to endocrine disorders: Secondary | ICD-10-CM | POA: Diagnosis not present

## 2024-04-30 DIAGNOSIS — E039 Hypothyroidism, unspecified: Secondary | ICD-10-CM | POA: Diagnosis not present

## 2024-05-01 LAB — CMP14+EGFR
ALT: 47 IU/L — ABNORMAL HIGH (ref 0–44)
AST: 37 IU/L (ref 0–40)
Albumin: 4.4 g/dL (ref 3.8–4.9)
Alkaline Phosphatase: 62 IU/L (ref 47–123)
BUN/Creatinine Ratio: 12 (ref 9–20)
BUN: 16 mg/dL (ref 6–24)
Bilirubin Total: 0.6 mg/dL (ref 0.0–1.2)
CO2: 25 mmol/L (ref 20–29)
Calcium: 9.5 mg/dL (ref 8.7–10.2)
Chloride: 100 mmol/L (ref 96–106)
Creatinine, Ser: 1.31 mg/dL — ABNORMAL HIGH (ref 0.76–1.27)
Globulin, Total: 2.2 g/dL (ref 1.5–4.5)
Glucose: 103 mg/dL — ABNORMAL HIGH (ref 70–99)
Potassium: 4 mmol/L (ref 3.5–5.2)
Sodium: 140 mmol/L (ref 134–144)
Total Protein: 6.6 g/dL (ref 6.0–8.5)
eGFR: 65 mL/min/1.73 (ref >59)

## 2024-05-01 LAB — PSA TOTAL (REFLEX TO FREE): Prostate Specific Ag, Serum: 2 ng/mL (ref 0.0–4.0)

## 2024-05-01 LAB — LIPID PANEL
Chol/HDL Ratio: 5.1 ratio — ABNORMAL HIGH (ref 0.0–5.0)
Cholesterol, Total: 235 mg/dL — ABNORMAL HIGH (ref 100–199)
HDL: 46 mg/dL (ref >39)
LDL Chol Calc (NIH): 170 mg/dL — ABNORMAL HIGH (ref 0–99)
Triglycerides: 106 mg/dL (ref 0–149)
VLDL Cholesterol Cal: 19 mg/dL (ref 5–40)

## 2024-05-01 LAB — CBC WITH DIFFERENTIAL/PLATELET
Basophils Absolute: 0 x10E3/uL (ref 0.0–0.2)
Basos: 1 %
EOS (ABSOLUTE): 0 x10E3/uL (ref 0.0–0.4)
Eos: 1 %
Hematocrit: 47.7 % (ref 37.5–51.0)
Hemoglobin: 15.7 g/dL (ref 13.0–17.7)
Immature Grans (Abs): 0 x10E3/uL (ref 0.0–0.1)
Immature Granulocytes: 0 %
Lymphocytes Absolute: 2.1 x10E3/uL (ref 0.7–3.1)
Lymphs: 50 %
MCH: 27.4 pg (ref 26.6–33.0)
MCHC: 32.9 g/dL (ref 31.5–35.7)
MCV: 83 fL (ref 79–97)
Monocytes Absolute: 0.4 x10E3/uL (ref 0.1–0.9)
Monocytes: 10 %
Neutrophils Absolute: 1.6 x10E3/uL (ref 1.4–7.0)
Neutrophils: 38 %
Platelets: 226 x10E3/uL (ref 150–450)
RBC: 5.74 x10E6/uL (ref 4.14–5.80)
RDW: 14.3 % (ref 11.6–15.4)
WBC: 4.2 x10E3/uL (ref 3.4–10.8)

## 2024-05-01 LAB — VITAMIN D 25 HYDROXY (VIT D DEFICIENCY, FRACTURES): Vit D, 25-Hydroxy: 31.1 ng/mL (ref 30.0–100.0)

## 2024-05-01 LAB — TSH+FREE T4
Free T4: 1.27 ng/dL (ref 0.82–1.77)
TSH: 2 u[IU]/mL (ref 0.450–4.500)

## 2024-05-06 ENCOUNTER — Encounter: Payer: Self-pay | Admitting: Nurse Practitioner

## 2024-05-06 ENCOUNTER — Ambulatory Visit: Admitting: Nurse Practitioner

## 2024-05-06 VITALS — BP 135/80 | HR 80 | Temp 96.1°F | Resp 16 | Ht 71.0 in | Wt 258.8 lb

## 2024-05-06 DIAGNOSIS — J45909 Unspecified asthma, uncomplicated: Secondary | ICD-10-CM

## 2024-05-06 DIAGNOSIS — I152 Hypertension secondary to endocrine disorders: Secondary | ICD-10-CM

## 2024-05-06 DIAGNOSIS — E1159 Type 2 diabetes mellitus with other circulatory complications: Secondary | ICD-10-CM | POA: Diagnosis not present

## 2024-05-06 DIAGNOSIS — Z23 Encounter for immunization: Secondary | ICD-10-CM

## 2024-05-06 DIAGNOSIS — E1169 Type 2 diabetes mellitus with other specified complication: Secondary | ICD-10-CM | POA: Diagnosis not present

## 2024-05-06 DIAGNOSIS — N522 Drug-induced erectile dysfunction: Secondary | ICD-10-CM | POA: Diagnosis not present

## 2024-05-06 DIAGNOSIS — Z79899 Other long term (current) drug therapy: Secondary | ICD-10-CM

## 2024-05-06 DIAGNOSIS — Z0001 Encounter for general adult medical examination with abnormal findings: Secondary | ICD-10-CM | POA: Diagnosis not present

## 2024-05-06 DIAGNOSIS — G720 Drug-induced myopathy: Secondary | ICD-10-CM

## 2024-05-06 DIAGNOSIS — E785 Hyperlipidemia, unspecified: Secondary | ICD-10-CM

## 2024-05-06 DIAGNOSIS — I10 Essential (primary) hypertension: Secondary | ICD-10-CM

## 2024-05-06 MED ORDER — VITAMIN D (ERGOCALCIFEROL) 1.25 MG (50000 UNIT) PO CAPS: 50000.0000 [IU] | ORAL_CAPSULE | ORAL | 1 refills | Status: AC

## 2024-05-06 MED ORDER — PNEUMOCOCCAL 20-VAL CONJ VACC 0.5 ML IM SUSY
0.5000 mL | PREFILLED_SYRINGE | Freq: Once | INTRAMUSCULAR | 0 refills | Status: AC | PRN
Start: 2024-05-06 — End: ?

## 2024-05-06 MED ORDER — ALBUTEROL SULFATE HFA 108 (90 BASE) MCG/ACT IN AERS: 2.0000 | INHALATION_SPRAY | Freq: Four times a day (QID) | RESPIRATORY_TRACT | 3 refills | Status: AC | PRN

## 2024-05-06 MED ORDER — LOSARTAN POTASSIUM 100 MG PO TABS: 100.0000 mg | ORAL_TABLET | Freq: Every day | ORAL | 3 refills | Status: AC

## 2024-05-06 MED ORDER — SILDENAFIL CITRATE 100 MG PO TABS
50.0000 mg | ORAL_TABLET | Freq: Every day | ORAL | 0 refills | Status: AC | PRN
Start: 2024-05-06 — End: ?

## 2024-05-06 MED ORDER — FEBUXOSTAT 40 MG PO TABS: ORAL_TABLET | ORAL | 3 refills | Status: AC

## 2024-05-06 MED ORDER — FENOFIBRATE 54 MG PO TABS: 54.0000 mg | ORAL_TABLET | Freq: Every day | ORAL | 3 refills | Status: DC

## 2024-05-06 MED ORDER — ZOSTER VAC RECOMB ADJUVANTED 50 MCG/0.5ML IM SUSR: 0.5000 mL | Freq: Once | INTRAMUSCULAR | 1 refills | Status: AC | PRN

## 2024-05-06 NOTE — Progress Notes (Signed)
 [facility] [address] [place], [place] [address]  Internal MEDICINE  Office Visit Note  Patient Name: Ronald Mata  [id number]  [id number]  Date of Service: 05/06/2024  Chief Complaint  Patient presents with   Diabetes   Hypertension   Annual Exam    HPI Ronald Mata presents for an annual well visit and physical exam.  Well-appearing 53 y.o. male with arthritis, diabetes, hypertension, hypothyroidism, and a history of a heart murmur.  Routine CRC screening: due in 2027 Eye exam: last eye exam was in December 2024 foot exam: done today.  Labs: lab results discussed with patient today.  Elevated creatinine level 1.31, normal eGFR 65.  Elevated LDL 170, total cholesterol 235, chol/HDL ratio 5.1. triglycerides, HDL and VLDL are normal. Not on statin therapy due to myopathy. Currently taking fenofibrate.  PSA is normal but did increase by 1.4 from last level done 3 years before.  Normal CBC, thyroid labs, and CBC.  New or worsening pain: some chronic pains, nothing new or worsening.  Other concerns: none     Current Medication: Outpatient Encounter Medications as of 05/06/2024  Medication Sig   albuterol  (VENTOLIN  HFA) 108 (90 Base) MCG/ACT inhaler Inhale 2 puffs into the lungs every 6 (six) hours as needed for wheezing or shortness of breath.   amLODipine  (NORVASC ) 5 MG tablet TAKE 1 TABLET(5 MG) BY MOUTH DAILY   cetirizine  (ZYRTEC ) 10 MG tablet TAKE 1 TABLET(10 MG) BY MOUTH DAILY   co-enzyme Q-10 30 MG capsule Take 30 mg by mouth daily.   dapagliflozin  propanediol (FARXIGA ) 10 MG TABS tablet Take 1 tablet (10 mg total) by mouth daily before breakfast.   ELDERBERRY PO Take by mouth daily.   febuxostat  (ULORIC ) 40 MG tablet TAKE 1 TABLET(40 MG) BY MOUTH DAILY   fenofibrate 54 MG tablet Take 1 tablet (54 mg total) by mouth daily.   fluticasone  (FLONASE ) 50 MCG/ACT nasal spray SHAKE LIQUID AND USE 2 SPRAYS IN EACH NOSTRIL DAILY   glucose blood (ONETOUCH VERIO) test  strip Use 1 test strip to check glucose twice daily for diabetes E11.65   hydrochlorothiazide  (HYDRODIURIL ) 25 MG tablet TAKE 1 TABLET BY MOUTH EVERY DAY   ibuprofen  (ADVIL ) 800 MG tablet Take 1 tablet (800 mg total) by mouth every 8 (eight) hours as needed.   Lancets (ONETOUCH DELICA PLUS LANCET33G) MISC Use 1 lancet to check glucose twice daily for diabetes E11.65   levothyroxine  (SYNTHROID ) 50 MCG tablet Take 1 tablet (50 mcg total) by mouth daily before breakfast.   losartan  (COZAAR ) 100 MG tablet Take 1 tablet (100 mg total) by mouth daily.   montelukast  (SINGULAIR ) 10 MG tablet Take one tab po qd for sinus congestion   pneumococcal 20-valent conjugate vaccine (PREVNAR 20) 0.5 ML injection Inject 0.5 mLs into the muscle once as needed for up to 1 dose for immunization.   Semaglutide  (RYBELSUS ) 7 MG TABS Take 1 tablet (7 mg total) by mouth daily.   sildenafil  (VIAGRA ) 100 MG tablet Take 0.5-1 tablets (50-100 mg total) by mouth daily as needed for erectile dysfunction.   Vitamin D , Ergocalciferol , (DRISDOL ) 1.25 MG (50000 UNIT) CAPS capsule Take 1 capsule (50,000 Units total) by mouth every 7 (seven) days.   Zoster Vaccine Adjuvanted [facility]) injection Inject 0.5 mLs into the muscle once as needed for up to 1 dose.   [DISCONTINUED] albuterol  (VENTOLIN  HFA) 108 (90 Base) MCG/ACT inhaler INHALE 2 PUFFS INTO THE LUNGS EVERY 6 HOURS AS NEEDED FOR WHEEZING OR SHORTNESS OF  BREATH   [DISCONTINUED] chlorpheniramine-HYDROcodone  (TUSSIONEX) 10-8 MG/5ML Take 5 mLs by mouth every 12 (twelve) hours as needed for cough. (Patient not taking: Reported on 05/06/2024)   [DISCONTINUED] febuxostat  (ULORIC ) 40 MG tablet TAKE 1 TABLET(40 MG) BY MOUTH DAILY   [DISCONTINUED] losartan  (COZAAR ) 100 MG tablet TAKE 1 TABLET(100 MG) BY MOUTH DAILY   [DISCONTINUED] methocarbamol  (ROBAXIN ) 500 MG tablet Take 1 tablet (500 mg total) by mouth every 8 (eight) hours as needed for muscle spasms. (Patient not taking: Reported on  05/06/2024)   [DISCONTINUED] pneumococcal 20-valent conjugate vaccine (PREVNAR 20) 0.5 ML injection Inject 0.5 mLs into the muscle once as needed for up to 1 dose for immunization.   [DISCONTINUED] predniSONE  (STERAPRED UNI-PAK 21 TAB) 10 MG (21) TBPK tablet Use as directed for 6 days   [DISCONTINUED] rosuvastatin  (CRESTOR ) 5 MG tablet Take 1 tablet (5 mg total) by mouth daily. (Patient not taking: Reported on 05/06/2024)   [DISCONTINUED] sildenafil  (VIAGRA ) 100 MG tablet Take 0.5-1 tablets (50-100 mg total) by mouth daily as needed for erectile dysfunction.   [DISCONTINUED] Vitamin D , Ergocalciferol , (DRISDOL ) 1.25 MG (50000 UNIT) CAPS capsule Take 1 capsule (50,000 Units total) by mouth every 7 (seven) days.   No facility-administered encounter medications on file as of 05/06/2024.    Surgical History: Past Surgical History:  Procedure Laterality Date   CARDIAC CATHETERIZATION     15 years ago no stents   COLONOSCOPY WITH PROPOFOL  N/A 10/15/2020   Procedure: COLONOSCOPY WITH PROPOFOL ;  Surgeon: Jinny Carmine, MD;  Location[facility];  Service: Endoscopy;  Laterality: N/A;  Diabetic   POLYPECTOMY  10/15/2020   Procedure: POLYPECTOMY;  Surgeon: Jinny Carmine, MD;  Location: [facility];  Service: Endoscopy;;   stomach wrap and achalasia      Medical History: Past Medical History:  Diagnosis Date   Arthritis    feet   Diabetes mellitus without complication (HCC)    Heart murmur    Hypertension    Thyroid disease     Family History: Family History  Problem Relation Age of Onset   Cancer Mother    Diabetes Mother    Hypertension Mother    Hyperlipidemia Mother    Stroke Mother    Osteoarthritis Mother    Diabetes Father    Osteoarthritis Father    Glaucoma Father     Social History   Socioeconomic History   Marital status: Married    Spouse name: Not on file   Number of children: Not on file   Years of education: Not on file   Highest education level:  Not on file  Occupational History   Not on file  Tobacco Use   Smoking status: Never   Smokeless tobacco: Never  Vaping Use   Vaping status: Never Used  Substance and Sexual Activity   Alcohol use: No    Alcohol/week: 0.0 standard drinks of alcohol   Drug use: No   Sexual activity: Not on file  Other Topics Concern   Not on file  Social History Narrative   Not on file   Social Drivers of Health   Financial Resource Strain: Not on file  Food Insecurity: Not on file  Transportation Needs: Not on file  Physical Activity: Not on file  Stress: Not on file  Social Connections: Not on file  Intimate Partner Violence: Not on file      Review of Systems  Constitutional:  Negative for activity change, appetite change, chills, fatigue, fever and unexpected weight  change.  HENT: Negative.  Negative for congestion, ear pain, rhinorrhea, sore throat and trouble swallowing.   Eyes: Negative.   Respiratory: Negative.  Negative for cough, chest tightness, shortness of breath and wheezing.   Cardiovascular: Negative.  Negative for chest pain and palpitations.  Gastrointestinal: Negative.  Negative for abdominal pain, blood in stool, constipation, diarrhea, nausea and vomiting.  Endocrine: Negative.   Genitourinary: Negative.  Negative for difficulty urinating, dysuria, frequency, hematuria and urgency.  Musculoskeletal: Negative.  Negative for arthralgias, back pain, joint swelling, myalgias and neck pain.  Skin: Negative.  Negative for rash and wound.  Allergic/Immunologic: Negative.  Negative for immunocompromised state.  Neurological: Negative.  Negative for dizziness, seizures, numbness and headaches.  Hematological: Negative.   Psychiatric/Behavioral: Negative.  Negative for behavioral problems, self-injury and suicidal ideas. The patient is not nervous/anxious.     Vital Signs: BP 135/80 Comment: 146/110  Pulse 80   Temp (!) 96.1 F (35.6 C)   Resp 16   Ht 5' 11 (1.803 m)    Wt 258 lb 12.8 oz (117.4 kg)   SpO2 96%   BMI 36.10 kg/m    Physical Exam Vitals reviewed.  Constitutional:      General: He is awake. He is not in acute distress.    Appearance: Normal appearance. He is well-developed and well-groomed. He is obese. He is not ill-appearing or diaphoretic.  HENT:     Head: Normocephalic and atraumatic.     Right Ear: Tympanic membrane, ear canal and external ear normal.     Left Ear: Tympanic membrane, ear canal and external ear normal.     Nose: Nose normal. No congestion or rhinorrhea.     Mouth/Throat:     Lips: Pink.     Mouth: Mucous membranes are moist.     Pharynx: Oropharynx is clear. Uvula midline. No oropharyngeal exudate or posterior oropharyngeal erythema.  Eyes:     General: Lids are normal. Vision grossly intact. Gaze aligned appropriately. No scleral icterus.       Right eye: No discharge.        Left eye: No discharge.     Extraocular Movements: Extraocular movements intact.     Conjunctiva/sclera: Conjunctivae normal.     Pupils: Pupils are equal, round, and reactive to light.     Funduscopic exam:    Right eye: Red reflex present.        Left eye: Red reflex present. Neck:     Thyroid: No thyromegaly.     Vascular: No JVD.     Trachea: No tracheal deviation.  Cardiovascular:     Rate and Rhythm: Normal rate and regular rhythm.     Pulses:          Carotid pulses are 3+ on the right side and 3+ on the left side.      Radial pulses are 2+ on the right side and 2+ on the left side.       Dorsalis pedis pulses are 3+ on the right side and 3+ on the left side.       Posterior tibial pulses are 3+ on the right side and 3+ on the left side.     Heart sounds: Normal heart sounds, S1 normal and S2 normal. No murmur heard.    No friction rub. No gallop.  Pulmonary:     Effort: Pulmonary effort is normal. No accessory muscle usage or respiratory distress.     Breath sounds: Normal breath sounds and air entry.  No stridor. No  wheezing or rales.  Chest:     Chest wall: No tenderness.  Abdominal:     General: Bowel sounds are normal. There is no distension.     Palpations: Abdomen is soft. There is no shifting dullness, fluid wave, mass or pulsatile mass.     Tenderness: There is no abdominal tenderness. There is no guarding or rebound.  Musculoskeletal:        General: No tenderness or deformity. Normal range of motion.     Cervical back: Normal range of motion and neck supple.     Right lower leg: No edema.     Left lower leg: No edema.     Right foot: Normal range of motion. No deformity, bunion, Charcot foot, foot drop or prominent metatarsal heads.     Left foot: Normal range of motion. No deformity, bunion, Charcot foot, foot drop or prominent metatarsal heads.  Feet:     Right foot:     Protective Sensation: 6 sites tested.  6 sites sensed.     Skin integrity: Dry skin present. No ulcer, blister, skin breakdown, erythema, warmth, callus or fissure.     Toenail Condition: Right toenails are abnormally thick.     Left foot:     Protective Sensation: 6 sites tested.  6 sites sensed.     Skin integrity: Dry skin present. No ulcer, blister, skin breakdown, erythema, warmth, callus or fissure.     Toenail Condition: Left toenails are abnormally thick.  Lymphadenopathy:     Cervical: No cervical adenopathy.  Skin:    General: Skin is warm and dry.     Capillary Refill: Capillary refill takes less than 2 seconds.     Coloration: Skin is not pale.     Findings: No erythema or rash.  Neurological:     Mental Status: He is alert and oriented to person, place, and time.     Cranial Nerves: No cranial nerve deficit.     Motor: No abnormal muscle tone.     Coordination: Coordination normal.     Gait: Gait normal.     Deep Tendon Reflexes: Reflexes are normal and symmetric.  Psychiatric:        Mood and Affect: Mood normal.        Behavior: Behavior normal. Behavior is cooperative.        Thought Content:  Thought content normal.        Judgment: Judgment normal.        Assessment/Plan: 1. Encounter for routine adult health examination with abnormal findings (Primary) Age-appropriate preventive screenings and vaccinations discussed, annual physical exam completed. Routine labs for health maintenance results discussed with the patient today. PHM updated.   - febuxostat  (ULORIC ) 40 MG tablet; TAKE 1 TABLET(40 MG) BY MOUTH DAILY  Dispense: 90 tablet; Refill: 3 - Vitamin D , Ergocalciferol , (DRISDOL ) 1.25 MG (50000 UNIT) CAPS capsule; Take 1 capsule (50,000 Units total) by mouth every 7 (seven) days.  Dispense: 12 capsule; Refill: 1 - albuterol  (VENTOLIN  HFA) 108 (90 Base) MCG/ACT inhaler; Inhale 2 puffs into the lungs every 6 (six) hours as needed for wheezing or shortness of breath.  Dispense: 8.5 g; Refill: 3  2. Type 2 diabetes mellitus with other circulatory complication, without long-term current use of insulin (HCC) Urine sent for microalbumin/creatinine ratio. Continue farxiga  and rybelsus  as prescribed.  - Urine Microalbumin w/creat. ratio  3. Hypertension associated with diabetes (HCC) Stable, continue losartan  as prescribed.  - losartan  (COZAAR ) 100 MG  tablet; Take 1 tablet (100 mg total) by mouth daily.  Dispense: 90 tablet; Refill: 3  4. Hyperlipidemia associated with type 2 diabetes mellitus (HCC) Continue fenofibrate as prescribed.  - fenofibrate 54 MG tablet; Take 1 tablet (54 mg total) by mouth daily.  Dispense: 30 tablet; Refill: 3  5. Drug-induced erectile dysfunction Continue sildenafil  as prescribed.  - sildenafil  (VIAGRA ) 100 MG tablet; Take 0.5-1 tablets (50-100 mg total) by mouth daily as needed for erectile dysfunction.  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   7. Need for vaccination - pneumococcal 20-valent conjugate vaccine (PREVNAR 20) 0.5 ML injection; Inject 0.5 mLs into the muscle  once as needed for up to 1 dose for immunization.  Dispense: 0.5 mL; Refill: 0 - Zoster Vaccine Adjuvanted [facility]) injection; Inject 0.5 mLs into the muscle once as needed for up to 1 dose.  Dispense: 0.5 mL; Refill: 1      General Counseling: Green verbalizes understanding of the findings of todays visit and agrees with plan of treatment. I have discussed any further diagnostic evaluation that may be needed or ordered today. We also reviewed his medications today. he has been encouraged to call the office with any questions or concerns that should arise related to todays visit.    Orders Placed This Encounter  Procedures   Urine Microalbumin w/creat. ratio    Meds ordered this encounter  Medications   pneumococcal 20-valent conjugate vaccine (PREVNAR 20) 0.5 ML injection    Sig: Inject 0.5 mLs into the muscle once as needed for up to 1 dose for immunization.    Dispense:  0.5 mL    Refill:  0    Please vaccinate patient with PCV 20.   Zoster Vaccine Adjuvanted (SHINGRIX) injection    Sig: Inject 0.5 mLs into the muscle once as needed for up to 1 dose.    Dispense:  0.5 mL    Refill:  1   febuxostat  (ULORIC ) 40 MG tablet    Sig: TAKE 1 TABLET(40 MG) BY MOUTH DAILY    Dispense:  90 tablet    Refill:  3   losartan  (COZAAR ) 100 MG tablet    Sig: Take 1 tablet (100 mg total) by mouth daily.    Dispense:  90 tablet    Refill:  3   fenofibrate 54 MG tablet    Sig: Take 1 tablet (54 mg total) by mouth daily.    Dispense:  30 tablet    Refill:  3    Fill new script today, discontinue rosuvastatin .   sildenafil  (VIAGRA ) 100 MG tablet    Sig: Take 0.5-1 tablets (50-100 mg total) by mouth daily as needed for erectile dysfunction.    Dispense:  10 tablet    Refill:  0   Vitamin D , Ergocalciferol , (DRISDOL ) 1.25 MG (50000 UNIT) CAPS capsule    Sig: Take 1 capsule (50,000 Units total) by mouth every 7 (seven) days.    Dispense:  12 capsule    Refill:  1   albuterol  (VENTOLIN  HFA)  108 (90 Base) MCG/ACT inhaler    Sig: Inhale 2 puffs into the lungs every 6 (six) hours as needed for wheezing or shortness of breath.    Dispense:  8.5 g    Refill:  3    Return in about 4 months (around 09/06/2024) for F/U, Recheck A1C, Kohner Orlick PCP.   Total time spent:30 Minutes Time spent includes review of chart,  medications, test results, and follow up plan with the patient.   [place] Controlled Substance Database was reviewed by me.  This patient was seen by Mardy Maxin, FNP-C in collaboration with Dr. Sigrid Bathe as a part of collaborative care agreement.  Konstance Happel R. Maxin, MSN, FNP-C Internal medicine

## 2024-05-07 LAB — MICROALBUMIN / CREATININE URINE RATIO
Creatinine, Urine: 180.4 mg/dL
Microalb/Creat Ratio: 70 mg/g{creat} — ABNORMAL HIGH (ref 0–29)
Microalbumin, Urine: 127 ug/mL

## 2024-05-29 ENCOUNTER — Other Ambulatory Visit: Payer: Self-pay

## 2024-05-29 MED ORDER — AZITHROMYCIN 250 MG PO TABS: ORAL_TABLET | ORAL | 0 refills | Status: AC

## 2024-05-29 MED ORDER — PREDNISONE 10 MG (21) PO TBPK: ORAL_TABLET | ORAL | 0 refills | Status: AC

## 2024-06-03 ENCOUNTER — Encounter: Payer: Self-pay | Admitting: Nurse Practitioner

## 2024-08-02 ENCOUNTER — Other Ambulatory Visit: Payer: Self-pay | Admitting: Nurse Practitioner

## 2024-08-02 DIAGNOSIS — E1169 Type 2 diabetes mellitus with other specified complication: Secondary | ICD-10-CM

## 2024-08-12 ENCOUNTER — Telehealth: Payer: Self-pay

## 2024-08-13 NOTE — Telephone Encounter (Signed)
 Pt had appt to discuss changing med due its not covered

## 2024-08-19 ENCOUNTER — Ambulatory Visit: Admitting: Nurse Practitioner

## 2024-09-09 ENCOUNTER — Ambulatory Visit: Admitting: Nurse Practitioner

## 2025-05-07 ENCOUNTER — Encounter: Admitting: Nurse Practitioner
